# Patient Record
Sex: Female | Born: 1950 | Race: White | Hispanic: No | Marital: Single | Smoking: Never smoker
Health system: Southern US, Community
[De-identification: ages and names within clinical notes are randomized; demographics above are authoritative.]

## PROBLEM LIST (undated history)

## (undated) DIAGNOSIS — M797 Fibromyalgia: Secondary | ICD-10-CM

## (undated) DIAGNOSIS — I10 Essential (primary) hypertension: Secondary | ICD-10-CM

## (undated) DIAGNOSIS — E7800 Pure hypercholesterolemia, unspecified: Secondary | ICD-10-CM

## (undated) DIAGNOSIS — G8929 Other chronic pain: Secondary | ICD-10-CM

## (undated) DIAGNOSIS — E119 Type 2 diabetes mellitus without complications: Secondary | ICD-10-CM

## (undated) DIAGNOSIS — R7302 Impaired glucose tolerance (oral): Secondary | ICD-10-CM

## (undated) DIAGNOSIS — K219 Gastro-esophageal reflux disease without esophagitis: Secondary | ICD-10-CM

## (undated) DIAGNOSIS — R112 Nausea with vomiting, unspecified: Secondary | ICD-10-CM

## (undated) DIAGNOSIS — N209 Urinary calculus, unspecified: Secondary | ICD-10-CM

## (undated) DIAGNOSIS — F329 Major depressive disorder, single episode, unspecified: Secondary | ICD-10-CM

## (undated) DIAGNOSIS — I509 Heart failure, unspecified: Secondary | ICD-10-CM

## (undated) DIAGNOSIS — R41840 Attention and concentration deficit: Secondary | ICD-10-CM

## (undated) DIAGNOSIS — M1990 Unspecified osteoarthritis, unspecified site: Secondary | ICD-10-CM

## (undated) DIAGNOSIS — F99 Mental disorder, not otherwise specified: Secondary | ICD-10-CM

## (undated) DIAGNOSIS — K649 Unspecified hemorrhoids: Secondary | ICD-10-CM

## (undated) DIAGNOSIS — N200 Calculus of kidney: Secondary | ICD-10-CM

## (undated) DIAGNOSIS — F418 Other specified anxiety disorders: Secondary | ICD-10-CM

## (undated) DIAGNOSIS — M25473 Effusion, unspecified ankle: Secondary | ICD-10-CM

## (undated) DIAGNOSIS — Z9889 Other specified postprocedural states: Secondary | ICD-10-CM

## (undated) DIAGNOSIS — R0600 Dyspnea, unspecified: Secondary | ICD-10-CM

## (undated) DIAGNOSIS — D649 Anemia, unspecified: Secondary | ICD-10-CM

## (undated) DIAGNOSIS — Z87442 Personal history of urinary calculi: Secondary | ICD-10-CM

## (undated) DIAGNOSIS — E669 Obesity, unspecified: Secondary | ICD-10-CM

## (undated) DIAGNOSIS — R519 Headache, unspecified: Secondary | ICD-10-CM

## (undated) DIAGNOSIS — F32A Depression, unspecified: Secondary | ICD-10-CM

## (undated) DIAGNOSIS — F419 Anxiety disorder, unspecified: Secondary | ICD-10-CM

## (undated) DIAGNOSIS — G4730 Sleep apnea, unspecified: Secondary | ICD-10-CM

## (undated) HISTORY — DX: Obesity, unspecified: E66.9

## (undated) HISTORY — DX: Unspecified osteoarthritis, unspecified site: M19.90

## (undated) HISTORY — DX: Effusion, unspecified ankle: M25.473

## (undated) HISTORY — PX: OTHER SURGICAL HISTORY: SHX169

## (undated) HISTORY — DX: Attention and concentration deficit: R41.840

## (undated) HISTORY — PX: HYSTERECTOMY ABDOMINAL WITH SALPINGECTOMY: SHX6725

## (undated) HISTORY — DX: Other specified anxiety disorders: F41.8

## (undated) HISTORY — DX: Impaired glucose tolerance (oral): R73.02

## (undated) HISTORY — PX: HAND SURGERY: SHX662

## (undated) HISTORY — PX: KNEE SURGERY: SHX244

## (undated) HISTORY — DX: Type 2 diabetes mellitus without complications: E11.9

## (undated) HISTORY — PX: NASAL SEPTUM SURGERY: SHX37

## (undated) HISTORY — DX: Pure hypercholesterolemia, unspecified: E78.00

## (undated) HISTORY — PX: EYE SURGERY: SHX253

---

## 1983-05-27 HISTORY — PX: KNEE SURGERY: SHX244

## 2000-01-25 ENCOUNTER — Encounter: Admission: RE | Admit: 2000-01-25 | Discharge: 2000-01-25 | Payer: Self-pay | Admitting: Orthopedic Surgery

## 2000-01-25 ENCOUNTER — Encounter: Payer: Self-pay | Admitting: Orthopedic Surgery

## 2008-09-01 ENCOUNTER — Emergency Department (HOSPITAL_COMMUNITY): Admission: EM | Admit: 2008-09-01 | Discharge: 2008-09-01 | Payer: Self-pay | Admitting: Emergency Medicine

## 2009-09-06 ENCOUNTER — Emergency Department (HOSPITAL_COMMUNITY): Admission: EM | Admit: 2009-09-06 | Discharge: 2009-09-06 | Payer: Self-pay | Admitting: Emergency Medicine

## 2010-07-18 ENCOUNTER — Emergency Department (INDEPENDENT_AMBULATORY_CARE_PROVIDER_SITE_OTHER): Payer: Self-pay

## 2010-07-18 ENCOUNTER — Emergency Department (HOSPITAL_BASED_OUTPATIENT_CLINIC_OR_DEPARTMENT_OTHER)
Admission: EM | Admit: 2010-07-18 | Discharge: 2010-07-18 | Disposition: A | Discharge status: Home / Self Care | Payer: Self-pay | Attending: Emergency Medicine | Admitting: Emergency Medicine

## 2010-07-18 DIAGNOSIS — E860 Dehydration: Secondary | ICD-10-CM | POA: Insufficient documentation

## 2010-07-18 DIAGNOSIS — R1011 Right upper quadrant pain: Secondary | ICD-10-CM | POA: Insufficient documentation

## 2010-07-18 DIAGNOSIS — K5900 Constipation, unspecified: Secondary | ICD-10-CM | POA: Insufficient documentation

## 2010-07-18 DIAGNOSIS — R1012 Left upper quadrant pain: Secondary | ICD-10-CM | POA: Insufficient documentation

## 2010-07-18 DIAGNOSIS — R142 Eructation: Secondary | ICD-10-CM

## 2010-07-18 DIAGNOSIS — I10 Essential (primary) hypertension: Secondary | ICD-10-CM | POA: Insufficient documentation

## 2010-07-18 DIAGNOSIS — R109 Unspecified abdominal pain: Secondary | ICD-10-CM

## 2010-07-18 LAB — URINALYSIS, ROUTINE W REFLEX MICROSCOPIC
Bilirubin Urine: NEGATIVE
Hgb urine dipstick: NEGATIVE
Ketones, ur: 15 mg/dL — AB
Nitrite: NEGATIVE
Protein, ur: NEGATIVE mg/dL
Specific Gravity, Urine: 1.021 (ref 1.005–1.030)
Urine Glucose, Fasting: NEGATIVE mg/dL
Urobilinogen, UA: 0.2 mg/dL (ref 0.0–1.0)
pH: 6 (ref 5.0–8.0)

## 2010-07-18 LAB — DIFFERENTIAL
Basophils Relative: 0 % (ref 0–1)
Eosinophils Absolute: 0.2 10*3/uL (ref 0.0–0.7)
Eosinophils Relative: 3 % (ref 0–5)
Lymphs Abs: 2 10*3/uL (ref 0.7–4.0)
Neutrophils Relative %: 64 % (ref 43–77)

## 2010-07-18 LAB — BASIC METABOLIC PANEL
BUN: 10 mg/dL (ref 6–23)
Creatinine, Ser: 1 mg/dL (ref 0.4–1.2)
GFR calc Af Amer: >60 mL/min (ref >60)
GFR calc non Af Amer: 57 mL/min — ABNORMAL LOW (ref >60)
Potassium: 5.1 mEq/L (ref 3.5–5.1)

## 2010-07-18 LAB — CBC
MCV: 85.9 fL (ref 78.0–100.0)
Platelets: 207 10*3/uL (ref 150–400)
RBC: 4.61 MIL/uL (ref 3.87–5.11)
RDW: 13.5 % (ref 11.5–15.5)
WBC: 7.7 10*3/uL (ref 4.0–10.5)

## 2010-08-14 LAB — DIFFERENTIAL
Basophils Absolute: 0 10*3/uL (ref 0.0–0.1)
Basophils Relative: 1 % (ref 0–1)
Eosinophils Absolute: 0.2 10*3/uL (ref 0.0–0.7)
Eosinophils Relative: 2 % (ref 0–5)
Lymphocytes Relative: 29 % (ref 12–46)
Lymphs Abs: 2.6 K/uL (ref 0.7–4.0)
Monocytes Absolute: 0.6 10*3/uL (ref 0.1–1.0)
Monocytes Relative: 7 % (ref 3–12)
Neutro Abs: 5.5 K/uL (ref 1.7–7.7)
Neutrophils Relative %: 62 % (ref 43–77)

## 2010-08-14 LAB — COMPREHENSIVE METABOLIC PANEL
ALT: 15 U/L (ref 0–35)
AST: 18 U/L (ref 0–37)
Albumin: 3.9 g/dL (ref 3.5–5.2)
Alkaline Phosphatase: 56 U/L (ref 39–117)
CO2: 21 mEq/L (ref 19–32)
Chloride: 107 mEq/L (ref 96–112)
GFR calc Af Amer: >60 mL/min (ref >60)
Potassium: 3.4 mEq/L — ABNORMAL LOW (ref 3.5–5.1)
Total Bilirubin: 0.6 mg/dL (ref 0.3–1.2)

## 2010-08-14 LAB — CBC
HCT: 41.5 % (ref 36.0–46.0)
Hemoglobin: 14.2 g/dL (ref 12.0–15.0)
MCHC: 34.2 g/dL (ref 30.0–36.0)
MCV: 88.9 fL (ref 78.0–100.0)
Platelets: 247 10*3/uL (ref 150–400)
RBC: 4.67 MIL/uL (ref 3.87–5.11)
RDW: 13.6 % (ref 11.5–15.5)
WBC: 9 10*3/uL (ref 4.0–10.5)

## 2010-08-14 LAB — URINE CULTURE
Colony Count: NO GROWTH
Culture: NO GROWTH

## 2010-08-14 LAB — URINALYSIS, ROUTINE W REFLEX MICROSCOPIC
Glucose, UA: NEGATIVE mg/dL
Hgb urine dipstick: NEGATIVE
Ketones, ur: 40 mg/dL — AB
Nitrite: NEGATIVE
Protein, ur: NEGATIVE mg/dL
Specific Gravity, Urine: 1.027 (ref 1.005–1.030)
Urobilinogen, UA: 1 mg/dL (ref 0.0–1.0)
pH: 5.5 (ref 5.0–8.0)

## 2010-08-14 LAB — COMPREHENSIVE METABOLIC PANEL WITH GFR
BUN: 8 mg/dL (ref 6–23)
Calcium: 9.2 mg/dL (ref 8.4–10.5)
Creatinine, Ser: 0.87 mg/dL (ref 0.4–1.2)
GFR calc non Af Amer: >60 mL/min (ref >60)
Glucose, Bld: 103 mg/dL — ABNORMAL HIGH (ref 70–99)
Sodium: 138 meq/L (ref 135–145)
Total Protein: 7.4 g/dL (ref 6.0–8.3)

## 2010-08-14 LAB — LIPASE, BLOOD: Lipase: 29 U/L (ref 11–59)

## 2010-08-14 LAB — POCT CARDIAC MARKERS: CKMB, poc: <1 ng/mL — ABNORMAL LOW (ref 1.0–8.0)

## 2010-09-04 LAB — URINALYSIS, ROUTINE W REFLEX MICROSCOPIC
Ketones, ur: 15 mg/dL — AB
Nitrite: NEGATIVE
pH: 6 (ref 5.0–8.0)

## 2010-09-04 LAB — BASIC METABOLIC PANEL
CO2: 26 mEq/L (ref 19–32)
Calcium: 9.3 mg/dL (ref 8.4–10.5)
GFR calc Af Amer: 50 mL/min — ABNORMAL LOW (ref >60)
GFR calc non Af Amer: 41 mL/min — ABNORMAL LOW (ref >60)
Sodium: 141 mEq/L (ref 135–145)

## 2010-09-04 LAB — DIFFERENTIAL
Lymphocytes Relative: 14 % (ref 12–46)
Lymphs Abs: 1.5 10*3/uL (ref 0.7–4.0)
Monocytes Absolute: 0.6 10*3/uL (ref 0.1–1.0)
Monocytes Relative: 5 % (ref 3–12)
Neutro Abs: 8.2 10*3/uL — ABNORMAL HIGH (ref 1.7–7.7)

## 2010-09-04 LAB — URINE MICROSCOPIC-ADD ON

## 2010-09-04 LAB — CBC
Hemoglobin: 12.7 g/dL (ref 12.0–15.0)
RBC: 4.3 MIL/uL (ref 3.87–5.11)

## 2011-11-01 ENCOUNTER — Emergency Department (HOSPITAL_BASED_OUTPATIENT_CLINIC_OR_DEPARTMENT_OTHER)
Admission: EM | Admit: 2011-11-01 | Discharge: 2011-11-01 | Disposition: A | Discharge status: Home / Self Care | Payer: Medicare Other | Attending: Emergency Medicine | Admitting: Emergency Medicine

## 2011-11-01 ENCOUNTER — Encounter (HOSPITAL_BASED_OUTPATIENT_CLINIC_OR_DEPARTMENT_OTHER): Payer: Self-pay | Admitting: *Deleted

## 2011-11-01 ENCOUNTER — Emergency Department (HOSPITAL_BASED_OUTPATIENT_CLINIC_OR_DEPARTMENT_OTHER): Payer: Medicare Other

## 2011-11-01 DIAGNOSIS — K5900 Constipation, unspecified: Secondary | ICD-10-CM | POA: Insufficient documentation

## 2011-11-01 DIAGNOSIS — Z79899 Other long term (current) drug therapy: Secondary | ICD-10-CM | POA: Insufficient documentation

## 2011-11-01 DIAGNOSIS — N390 Urinary tract infection, site not specified: Secondary | ICD-10-CM | POA: Insufficient documentation

## 2011-11-01 DIAGNOSIS — R109 Unspecified abdominal pain: Secondary | ICD-10-CM

## 2011-11-01 HISTORY — DX: Calculus of kidney: N20.0

## 2011-11-01 LAB — URINALYSIS, ROUTINE W REFLEX MICROSCOPIC
Bilirubin Urine: NEGATIVE
Hgb urine dipstick: NEGATIVE
Nitrite: NEGATIVE
Specific Gravity, Urine: 1.009 (ref 1.005–1.030)
pH: 5.5 (ref 5.0–8.0)

## 2011-11-01 LAB — COMPREHENSIVE METABOLIC PANEL
Alkaline Phosphatase: 74 U/L (ref 39–117)
BUN: 11 mg/dL (ref 6–23)
CO2: 26 mEq/L (ref 19–32)
GFR calc Af Amer: 70 mL/min — ABNORMAL LOW (ref >90)
GFR calc non Af Amer: 60 mL/min — ABNORMAL LOW (ref >90)
Glucose, Bld: 114 mg/dL — ABNORMAL HIGH (ref 70–99)
Potassium: 4 mEq/L (ref 3.5–5.1)
Total Bilirubin: 0.3 mg/dL (ref 0.3–1.2)
Total Protein: 8.2 g/dL (ref 6.0–8.3)

## 2011-11-01 LAB — DIFFERENTIAL
Eosinophils Absolute: 0.2 10*3/uL (ref 0.0–0.7)
Lymphocytes Relative: 31 % (ref 12–46)
Lymphs Abs: 3 10*3/uL (ref 0.7–4.0)
Monocytes Relative: 8 % (ref 3–12)
Neutrophils Relative %: 59 % (ref 43–77)

## 2011-11-01 LAB — URINE MICROSCOPIC-ADD ON

## 2011-11-01 LAB — CBC
Hemoglobin: 14.6 g/dL (ref 12.0–15.0)
MCH: 28.8 pg (ref 26.0–34.0)
MCV: 86.4 fL (ref 78.0–100.0)
RBC: 5.07 MIL/uL (ref 3.87–5.11)

## 2011-11-01 LAB — LIPASE, BLOOD: Lipase: 49 U/L (ref 11–59)

## 2011-11-01 MED ORDER — NITROFURANTOIN MONOHYD MACRO 100 MG PO CAPS: 100.0000 mg | ORAL_CAPSULE | Freq: Two times a day (BID) | ORAL | Status: AC

## 2011-11-01 MED ORDER — NITROFURANTOIN MONOHYD MACRO 100 MG PO CAPS: 100.0000 mg | ORAL_CAPSULE | Freq: Two times a day (BID) | ORAL | Status: DC

## 2011-11-01 NOTE — Discharge Instructions (Signed)
Urinary Tract Infection  Infections of the urinary tract can start in several places. A bladder infection (cystitis), a kidney infection (pyelonephritis), and a prostate infection (prostatitis) are different types of urinary tract infections (UTIs). They usually get better if treated with medicines (antibiotics) that kill germs. Take all the medicine until it is gone. You or your child may feel better in a few days, but TAKE ALL MEDICINE or the infection may not respond and may become more difficult to treat.  HOME CARE INSTRUCTIONS    Drink enough water and fluids to keep the urine clear or pale yellow. Cranberry juice is especially recommended, in addition to large amounts of water.   Avoid caffeine, tea, and carbonated beverages. They tend to irritate the bladder.   Alcohol may irritate the prostate.   Only take over-the-counter or prescription medicines for pain, discomfort, or fever as directed by your caregiver.  To prevent further infections:   Empty the bladder often. Avoid holding urine for long periods of time.   After a bowel movement, women should cleanse from front to back. Use each tissue only once.   Empty the bladder before and after sexual intercourse.  FINDING OUT THE RESULTS OF YOUR TEST  Not all test results are available during your visit. If your or your child's test results are not back during the visit, make an appointment with your caregiver to find out the results. Do not assume everything is normal if you have not heard from your caregiver or the medical facility. It is important for you to follow up on all test results.  SEEK MEDICAL CARE IF:    There is back pain.   Your baby is older than 3 months with a rectal temperature of 100.5 F (38.1 C) or higher for more than 1 day.   Your or your child's problems (symptoms) are no better in 3 days. Return sooner if you or your child is getting worse.  SEEK IMMEDIATE MEDICAL CARE IF:    There is severe back pain or lower abdominal  pain.   You or your child develops chills.   You have a fever.   Your baby is older than 3 months with a rectal temperature of 102 F (38.9 C) or higher.   Your baby is 3 months old or younger with a rectal temperature of 100.4 F (38 C) or higher.   There is nausea or vomiting.   There is continued burning or discomfort with urination.  MAKE SURE YOU:    Understand these instructions.   Will watch your condition.   Will get help right away if you are not doing well or get worse.  Document Released: 02/19/2005 Document Revised: 05/01/2011 Document Reviewed: 09/24/2006  ExitCare Patient Information 2012 [employer].    Abdominal Pain  Abdominal pain can be caused by many things. Your caregiver decides the seriousness of your pain by an examination and possibly blood tests and X-rays. Many cases can be observed and treated at home. Most abdominal pain is not caused by a disease and will probably improve without treatment. However, in many cases, more time must pass before a clear cause of the pain can be found. Before that point, it may not be known if you need more testing, or if hospitalization or surgery is needed.  HOME CARE INSTRUCTIONS    Do not take laxatives unless directed by your caregiver.   Take pain medicine only as directed by your caregiver.   Only take over-the-counter or   prescription medicines for pain, discomfort, or fever as directed by your caregiver.   Try a clear liquid diet (broth, tea, or water) for as long as directed by your caregiver. Slowly move to a bland diet as tolerated.  SEEK IMMEDIATE MEDICAL CARE IF:    The pain does not go away.   You have a fever.   You keep throwing up (vomiting).   The pain is felt only in portions of the abdomen. Pain in the right side could possibly be appendicitis. In an adult, pain in the left lower portion of the abdomen could be colitis or diverticulitis.   You pass bloody or black tarry stools.  MAKE SURE YOU:    Understand these  instructions.   Will watch your condition.   Will get help right away if you are not doing well or get worse.  Document Released: 02/19/2005 Document Revised: 05/01/2011 Document Reviewed: 12/29/2007  [employer] Patient Information 2012 [employer].

## 2011-11-01 NOTE — ED Provider Notes (Signed)
History     CSN: [id number]  Arrival date & time 11/01/11  0865   First MD Initiated Contact with Patient 11/01/11 1915      Chief Complaint  Patient presents with  . Nausea    (Consider location/radiation/quality/duration/timing/severity/associated sxs/prior treatment) Patient is a 61 y.o. female presenting with constipation. The history is provided by the patient. No language interpreter was used.  Constipation  The current episode started 5 to 7 days ago. The problem occurs continuously. The problem has been rapidly worsening. The pain is moderate. The stool is described as soft. Prior successful therapies include fiber, laxatives and stool softeners. Associated symptoms include a fever, abdominal pain and vomiting. She has been eating and drinking normally. There were no sick contacts. She has received no recent medical care.  Pt complains of increased belching,  Abdominal bloating.   Pt feels constipated.  Pt has used multiple stool softners and laxatives,  Pt reports she has been taking high dose vitamin D.  Past Medical History  Diagnosis Date  . Kidney stone     Past Surgical History  Procedure Date  . Knee surgery     History reviewed. No pertinent family history.  History  Substance Use Topics  . Smoking status: Never Smoker   . Smokeless tobacco: Not on file  . Alcohol Use: No    OB History    Grav Para Term Preterm Abortions TAB SAB Ect Mult Living                  Review of Systems  Constitutional: Positive for fever.  Gastrointestinal: Positive for vomiting, abdominal pain and constipation.  All other systems reviewed and are negative.    Allergies  Penicillins  Home Medications   Current Outpatient Rx  Name Route Sig Dispense Refill  . BISACODYL 5 MG PO TBEC Oral Take 5 mg by mouth daily as needed.    . CHOLECALCIFEROL 400 UNITS PO TABS Oral Take 1,600 Units by mouth daily.    Marland Kitchen FLUOXETINE HCL 40 MG PO CAPS Oral Take 40 mg by mouth daily.      . IBUPROFEN 800 MG PO TABS Oral Take 800 mg by mouth every 8 (eight) hours as needed. Patient is using this medication for pain.    Marland Kitchen POLYETHYLENE GLYCOL 3350 PO PACK Oral Take 17 g by mouth daily.    . QUETIAPINE FUMARATE 50 MG PO TABS Oral Take 50 mg by mouth at bedtime. Patient is using this medication for sleep.    . SENNA-DOCUSATE SODIUM 8.6-50 MG PO TABS Oral Take 1 tablet by mouth daily.      BP 172/91  Pulse 74  Temp(Src) 99.2 F (37.3 C) (Oral)  Resp 20  Ht 5\' 5"  (1.651 m)  Wt 240 lb (108.863 kg)  BMI 39.94 kg/m2  SpO2 95%  Physical Exam  Nursing note and vitals reviewed. Constitutional: She is oriented to person, place, and time. She appears well-developed and well-nourished.  HENT:  Head: Normocephalic and atraumatic.  Right Ear: External ear normal.  Left Ear: External ear normal.  Nose: Nose normal.  Mouth/Throat: Oropharynx is clear and moist.  Eyes: Conjunctivae and EOM are normal. Pupils are equal, round, and reactive to light.  Neck: Normal range of motion. Neck supple.  Cardiovascular: Normal rate, regular rhythm and normal heart sounds.   Pulmonary/Chest: Effort normal and breath sounds normal.  Abdominal: Soft. Bowel sounds are normal.  Musculoskeletal: Normal range of motion.  Neurological: She is alert and  oriented to person, place, and time.  Skin: Skin is warm.  Psychiatric: She has a normal mood and affect.    ED Course  Procedures (including critical care time)  Labs Reviewed  URINALYSIS, ROUTINE W REFLEX MICROSCOPIC - Abnormal; Notable for the following:    Leukocytes, UA SMALL (*)    All other components within normal limits  COMPREHENSIVE METABOLIC PANEL - Abnormal; Notable for the following:    Glucose, Bld 114 (*)    GFR calc non Af Amer 60 (*)    GFR calc Af Amer 70 (*)    All other components within normal limits  URINE MICROSCOPIC-ADD ON - Abnormal; Notable for the following:    Squamous Epithelial / LPF FEW (*)    Bacteria, UA FEW  (*)    All other components within normal limits  CBC  DIFFERENTIAL  LIPASE, BLOOD   Dg Abd 1 View  11/01/2011  *RADIOLOGY REPORT*  Clinical Data: Pain with nausea and diarrhea.  ABDOMEN - 1 VIEW  Comparison:  None.  Findings: The bowel gas pattern is normal.  No radio-opaque calculi or other significant radiographic abnormality is seen.  IMPRESSION: Negative.  Original Report Authenticated By: Elsie Stain, M.D.     1. UTI (lower urinary tract infection)   2. Abdominal pain       MDM  Pt scheduled for gallbladder ultrsound.   I counseled on uti.           Lonia Skinner [place], [place] 11/02/11 [phone]

## 2011-11-01 NOTE — ED Notes (Signed)
Pt states she has been on Tramadol for awhile and was reading that it may cause constipation. Has been taking steps to improve that situation, but now has diarrhea and thinks she may also have a kidney infection.

## 2011-11-03 ENCOUNTER — Ambulatory Visit (HOSPITAL_BASED_OUTPATIENT_CLINIC_OR_DEPARTMENT_OTHER)
Admit: 2011-11-03 | Discharge: 2011-11-03 | Disposition: A | Discharge status: Home / Self Care | Payer: Medicare Other | Attending: Emergency Medicine | Admitting: Emergency Medicine

## 2011-11-03 ENCOUNTER — Inpatient Hospital Stay (HOSPITAL_BASED_OUTPATIENT_CLINIC_OR_DEPARTMENT_OTHER): Admit: 2011-11-03 | Payer: Medicare Other

## 2011-11-03 DIAGNOSIS — K838 Other specified diseases of biliary tract: Secondary | ICD-10-CM | POA: Insufficient documentation

## 2011-11-03 DIAGNOSIS — R141 Gas pain: Secondary | ICD-10-CM | POA: Insufficient documentation

## 2011-11-03 DIAGNOSIS — R110 Nausea: Secondary | ICD-10-CM | POA: Insufficient documentation

## 2011-11-03 DIAGNOSIS — R509 Fever, unspecified: Secondary | ICD-10-CM | POA: Insufficient documentation

## 2011-11-03 DIAGNOSIS — K5900 Constipation, unspecified: Secondary | ICD-10-CM | POA: Insufficient documentation

## 2011-11-03 DIAGNOSIS — N390 Urinary tract infection, site not specified: Secondary | ICD-10-CM | POA: Insufficient documentation

## 2011-11-03 DIAGNOSIS — R143 Flatulence: Secondary | ICD-10-CM | POA: Insufficient documentation

## 2011-11-03 DIAGNOSIS — R142 Eructation: Secondary | ICD-10-CM | POA: Insufficient documentation

## 2011-11-03 NOTE — ED Provider Notes (Signed)
This note is being added as an addendum to the prior emergency department visit.  Patient has come back today for her abdominal ultrasound and I did speak with the patient regarding her results.  I informed her of the fact that she does have gallbladder sludge but no apparent cholecystitis on exam.  Patient does note that when she she gets pain and nausea.  She is comfortable at this time.  I'm going to refer the patient to general surgery for evaluation for possible cholecystectomy to [facility] surgery.  Nat Christen, MD 11/03/11 [phone]

## 2011-11-04 NOTE — ED Provider Notes (Signed)
Medical screening examination/treatment/procedure(s) were performed by non-physician practitioner and as supervising physician I was immediately available for consultation/collaboration.  Ajai Terhaar, MD 11/04/11 1222 

## 2011-11-26 ENCOUNTER — Encounter (INDEPENDENT_AMBULATORY_CARE_PROVIDER_SITE_OTHER): Payer: Self-pay | Admitting: General Surgery

## 2011-11-26 ENCOUNTER — Ambulatory Visit (INDEPENDENT_AMBULATORY_CARE_PROVIDER_SITE_OTHER): Payer: Medicare Other | Admitting: General Surgery

## 2011-11-26 VITALS — BP 117/77 | HR 85 | Temp 97.5°F | Resp 16 | Ht 65.0 in | Wt 248.0 lb

## 2011-11-26 DIAGNOSIS — K8020 Calculus of gallbladder without cholecystitis without obstruction: Secondary | ICD-10-CM

## 2011-11-26 NOTE — Progress Notes (Signed)
Patient ID: Olivia Walker, female   DOB: [birth year], 61 y.o.   MRN: [id number]  Chief Complaint  Patient presents with  . Abdominal Pain    Gallbladder    HPI Olivia Walker is a 61 y.o. female.  Referred by Dr. Cyndra Numbers HPI This is a 61 year old female who has had some for abdominal bloating for some time. She has also complained of some right-sided back pain just below her scapula as well as the right upper quadrant abdominal pain. She was recently seen in the emergency room was diagnosed with a urinary tract infection but also complained of more right upper quadrant pain. At that point underwent an ultrasound that showed biliary sludge and no other really abnormalities. She's been watching what she eats since then. She does associate with the bloating and her upper abdomen the right-sided back pain and some right upper quadrant pain with eating and this has gotten better see she change her diet. She's had no nausea or vomiting associated with this. Her bowel movements are otherwise normal at just for her regular constipation. She comes in today to discuss the possibility of her gallbladder as a source of some of her symptoms.  Past Medical History  Diagnosis Date  . Kidney stone   . Arthritis     Past Surgical History  Procedure Date  . Knee surgery   . Knee surgery 1985    lateral release  . Hand surgery     History reviewed. No pertinent family history.  Social History History  Substance Use Topics  . Smoking status: Never Smoker   . Smokeless tobacco: Not on file  . Alcohol Use: No    Allergies  Allergen Reactions  . Penicillins     Current Outpatient Prescriptions  Medication Sig Dispense Refill  . FLUoxetine (PROZAC) 40 MG capsule Take 40 mg by mouth daily.      Marland Kitchen ibuprofen (ADVIL,MOTRIN) 800 MG tablet Take 800 mg by mouth every 8 (eight) hours as needed. Patient is using this medication for pain.      . polyethylene glycol (MIRALAX / GLYCOLAX) packet Take 17 g by  mouth daily.      . QUEtiapine (SEROQUEL) 50 MG tablet Take 50 mg by mouth at bedtime. Patient is using this medication for sleep.      . sennosides-docusate sodium (SENOKOT-S) 8.6-50 MG tablet Take 1 tablet by mouth daily.        Review of Systems Review of Systems  Constitutional: Negative for fever, chills and unexpected weight change.  HENT: Positive for congestion. Negative for hearing loss, sore throat, trouble swallowing and voice change.   Eyes: Negative for visual disturbance.  Respiratory: Negative for cough and wheezing.   Cardiovascular: Negative for chest pain, palpitations and leg swelling.  Gastrointestinal: Negative for nausea, vomiting, abdominal pain, diarrhea, constipation, blood in stool, abdominal distention and anal bleeding.  Genitourinary: Negative for hematuria, vaginal bleeding and difficulty urinating.  Musculoskeletal: Positive for arthralgias.  Skin: Negative for rash and wound.  Neurological: Negative for seizures, syncope and headaches.  Hematological: Negative for adenopathy. Does not bruise/bleed easily.  Psychiatric/Behavioral: Negative for confusion.    Blood pressure 117/77, pulse 85, temperature 97.5 F (36.4 C), temperature source Temporal, resp. rate 16, height 5\' 5"  (1.651 m), weight 248 lb (112.492 kg).  Physical Exam Physical Exam  Vitals reviewed. Constitutional: She appears well-developed and well-nourished.  Eyes: No scleral icterus.  Cardiovascular: Normal rate, regular rhythm and normal heart sounds.   Pulmonary/Chest:  Effort normal and breath sounds normal. She has no wheezes. She has no rales.  Abdominal: Soft. Normal appearance and bowel sounds are normal. There is tenderness in the right upper quadrant. There is negative Murphy's sign. No hernia.  Lymphadenopathy:    She has no cervical adenopathy.    Data Reviewed COMPLETE ABDOMINAL ULTRASOUND  Comparison: 11/01/2011.  Findings:  Gallbladder: Biliary sludge is present in the  dependent portions  of the gallbladder. No posterior acoustic shadowing is present to  suggest definite calculi. Gallbladder wall thickness is normal at  2 mm and there is no sonographic Murphy's sign.  Common bile duct: 3 mm, normal.  Liver: Liver is isoechoic when compared to the adjacent right  kidney. No mass lesion.  IVC: Suboptimal visualization due to overlying bowel gas.  Pancreas: Suboptimal visualization due to overlying bowel gas.  Spleen: 7.9 cm. Normal echotexture.  Right Kidney: 9.6 cm. Normal echotexture. Normal central sinus  echo complex. No calculi or hydronephrosis.  Left Kidney: 10.3 cm. Normal echotexture. Normal central sinus  echo complex. No calculi or hydronephrosis.  Abdominal aorta: The visualized portions. Suboptimal  visualization due to body habitus and bowel gas.  IMPRESSION:  1. Biliary sludge without findings to suggest cholecystitis. No  stones.  2. Technically suboptimal exam due to overlying bowel gas and body  habitus.   Assessment    Symptomatic cholelithiasis    Plan    Lap cholecystectomy  After talking with her and reviewing u/s with sludge I do think she has biliary related symptoms and we discussed lap chole both for symptoms now as well as prevention of further problems.I did tell her I did not think this would relieve all of her upper abdominal symptoms. I discussed the procedure in detail.  The patient was given Agricultural engineer.  We discussed the risks and benefits of a laparoscopic cholecystectomy and possible cholangiogram including, but not limited to bleeding, infection, injury to surrounding structures such as the intestine or liver, bile leak, retained gallstones, need to convert to an open procedure, prolonged diarrhea, blood clots such as  DVT, common bile duct injury, anesthesia risks, and possible need for additional procedures.  The likelihood of improvement in symptoms and return to the patient's normal status is good. We  discussed the typical post-operative recovery course.        Olivia Walker 11/26/2011, 12:36 PM

## 2011-11-26 NOTE — Patient Instructions (Signed)
[facility] LAPAROSCOPIC SURGERY: POST OP INSTRUCTIONS  Always review your discharge instruction sheet given to you by the facility where your surgery was performed. IF YOU HAVE DISABILITY OR FAMILY LEAVE FORMS, YOU MUST BRING THEM TO THE OFFICE FOR PROCESSING.   DO NOT GIVE THEM TO YOUR DOCTOR.  1. A prescription for pain medication may be given to you upon discharge.  Take your pain medication as prescribed, if needed.  If narcotic pain medicine is not needed, then you may take acetaminophen (Tylenol), naprosyn (Alleve), or ibuprofen (Advil) as needed. 2. Take your usually prescribed medications unless otherwise directed. 3. If you need a refill on your pain medication, please contact your pharmacy.  They will contact our office to request authorization. Prescriptions will not be filled after 5pm or on week-ends. 4. You should follow a light diet the first few days after arrival home, such as soup and crackers, etc.  Be sure to include lots of fluids daily. 5. Most patients will experience some swelling and bruising in the area of the incisions.  Ice packs will help.  Swelling and bruising can take several days to resolve.  6. It is common to experience some constipation if taking pain medication after surgery.  Increasing fluid intake and taking a stool softener (such as Colace) will usually help or prevent this problem from occurring.  A mild laxative (Milk of Magnesia or Miralax) should be taken according to package instructions if there are no bowel movements after 48 hours. 7. Unless discharge instructions indicate otherwise, you may remove your bandages 48 hours after surgery, and you may shower at that time.  You may have steri-strips (small skin tapes) in place directly over the incision.  These strips should be left on the skin for 7-10 days.  If your surgeon used skin glue on the incision, you may shower in 24 hours.  The glue will flake  off over the next 2-3 weeks.  Any sutures or staples will be removed at the office during your follow-up visit. 8. ACTIVITIES:  You may resume regular (light) daily activities beginning the next day--such as daily self-care, walking, climbing stairs--gradually increasing activities as tolerated.  You may have sexual intercourse when it is comfortable.  Refrain from any heavy lifting or straining until approved by your doctor. a. You may drive when you are no longer taking prescription pain medication, you can comfortably wear a seatbelt, and you can safely maneuver your car and apply brakes. b. RETURN TO WORK:  __________________________________________________________ 9. You should see your doctor in the office for a follow-up appointment approximately 2-3 weeks after your surgery.  Make sure that you call for this appointment within a day or two after you arrive home to insure a convenient appointment time. 10. OTHER INSTRUCTIONS: __________________________________________________________________________________________________________________________ __________________________________________________________________________________________________________________________ WHEN TO CALL YOUR DOCTOR: 1. Fever over 101.0 2. Inability to urinate 3. Continued bleeding from incision. 4. Increased pain, redness, or drainage from the incision. 5. Increasing abdominal pain  The clinic staff is available to answer your questions during regular business hours.  Please don't hesitate to call and ask to speak to one of the nurses for clinical concerns.  If you have a medical emergency, go to the nearest emergency room or call 911.  A surgeon from [facility] is always on call at the hospital. [address], [address], [place], [place]  [address] ? [address] [address], [place], [place]   [address] [phone] ? [phone] ? [phone]   [phone] Web site: [email or web address]  

## 2011-12-23 ENCOUNTER — Encounter (HOSPITAL_COMMUNITY): Payer: Self-pay | Admitting: Pharmacy Technician

## 2011-12-29 ENCOUNTER — Encounter (HOSPITAL_COMMUNITY): Payer: Self-pay

## 2011-12-29 ENCOUNTER — Encounter (HOSPITAL_COMMUNITY)
Admission: RE | Admit: 2011-12-29 | Discharge: 2011-12-29 | Disposition: A | Discharge status: Home / Self Care | Payer: Medicare Other | Source: Ambulatory Visit | Attending: General Surgery | Admitting: General Surgery

## 2011-12-29 HISTORY — DX: Urinary calculus, unspecified: N20.9

## 2011-12-29 HISTORY — DX: Gastro-esophageal reflux disease without esophagitis: K21.9

## 2011-12-29 HISTORY — DX: Mental disorder, not otherwise specified: F99

## 2011-12-29 HISTORY — DX: Sleep apnea, unspecified: G47.30

## 2011-12-29 LAB — COMPREHENSIVE METABOLIC PANEL
ALT: 15 U/L (ref 0–35)
AST: 22 U/L (ref 0–37)
Calcium: 9.5 mg/dL (ref 8.4–10.5)
GFR calc Af Amer: 74 mL/min — ABNORMAL LOW (ref >90)
Glucose, Bld: 117 mg/dL — ABNORMAL HIGH (ref 70–99)
Sodium: 142 mEq/L (ref 135–145)
Total Protein: 7.4 g/dL (ref 6.0–8.3)

## 2011-12-29 LAB — SURGICAL PCR SCREEN: MRSA, PCR: NEGATIVE

## 2011-12-29 LAB — CBC
MCH: 28.8 pg (ref 26.0–34.0)
MCHC: 32.2 g/dL (ref 30.0–36.0)
Platelets: 238 10*3/uL (ref 150–400)

## 2011-12-29 MED ORDER — CIPROFLOXACIN IN D5W 400 MG/200ML IV SOLN
400.0000 mg | INTRAVENOUS | Status: AC
  Administered 2011-12-30: 400 mg via INTRAVENOUS
  Filled 2011-12-29: qty 200

## 2011-12-29 NOTE — Pre-Procedure Instructions (Addendum)
20 Olivia Walker  12/29/2011   Your procedure is scheduled on:  12/30/11  Report to [facility] at530* AM.  Call this number if you have problems the morning of surgery: [phone]   Remember:   Do not eat food or drink:After Midnight.  .  Take these medicines the morning of surgery with A SIP OF WATER: prozac, acid tab  STOP ibuprofen, other nsaids, aspirin, herbal meds, blood thinners   Do not wear jewelry, make-up or nail polish.  Do not wear lotions, powders, or perfumes. You may wear deodorant.  Do not shave 48 hours prior to surgery. Men may shave face and neck.  Do not bring valuables to the hospital.  Contacts, dentures or bridgework may not be worn into surgery.  Leave suitcase in the car. After surgery it may be brought to your room.  For patients admitted to the hospital, checkout time is 11:00 AM the day of discharge.   Patients discharged the day of surgery will not be allowed to drive home.  Name and phone number of your driver:linda hedgecock [phone] friend  Special Instructions: CHG Shower Use Special Wash: 1/2 bottle night before surgery and 1/2 bottle morning of surgery.   Please read over the following fact sheets that you were given: Pain Booklet, Coughing and Deep Breathing and MRSA Information

## 2011-12-29 NOTE — Progress Notes (Signed)
req ekg, cxr from dr Wylene Simmer done 7.13

## 2011-12-30 ENCOUNTER — Ambulatory Visit (HOSPITAL_COMMUNITY)
Admission: RE | Admit: 2011-12-30 | Discharge: 2011-12-30 | Disposition: A | Discharge status: Home / Self Care | Payer: Medicare Other | Source: Ambulatory Visit | Attending: General Surgery | Admitting: General Surgery

## 2011-12-30 ENCOUNTER — Encounter (HOSPITAL_COMMUNITY): Payer: Self-pay | Admitting: Anesthesiology

## 2011-12-30 ENCOUNTER — Encounter (HOSPITAL_COMMUNITY)
Admission: RE | Disposition: A | Discharge status: Home / Self Care | Payer: Self-pay | Source: Ambulatory Visit | Attending: General Surgery

## 2011-12-30 ENCOUNTER — Ambulatory Visit (HOSPITAL_COMMUNITY): Payer: Medicare Other | Admitting: Anesthesiology

## 2011-12-30 DIAGNOSIS — Z01812 Encounter for preprocedural laboratory examination: Secondary | ICD-10-CM | POA: Insufficient documentation

## 2011-12-30 DIAGNOSIS — K824 Cholesterolosis of gallbladder: Secondary | ICD-10-CM

## 2011-12-30 DIAGNOSIS — K811 Chronic cholecystitis: Secondary | ICD-10-CM

## 2011-12-30 DIAGNOSIS — K219 Gastro-esophageal reflux disease without esophagitis: Secondary | ICD-10-CM | POA: Insufficient documentation

## 2011-12-30 DIAGNOSIS — M129 Arthropathy, unspecified: Secondary | ICD-10-CM | POA: Insufficient documentation

## 2011-12-30 DIAGNOSIS — F411 Generalized anxiety disorder: Secondary | ICD-10-CM | POA: Insufficient documentation

## 2011-12-30 DIAGNOSIS — I10 Essential (primary) hypertension: Secondary | ICD-10-CM | POA: Insufficient documentation

## 2011-12-30 DIAGNOSIS — K8020 Calculus of gallbladder without cholecystitis without obstruction: Secondary | ICD-10-CM | POA: Insufficient documentation

## 2011-12-30 HISTORY — PX: CHOLECYSTECTOMY: SHX55

## 2011-12-30 SURGERY — LAPAROSCOPIC CHOLECYSTECTOMY
Anesthesia: General | Site: Abdomen | Wound class: Contaminated

## 2011-12-30 MED ORDER — FENTANYL CITRATE 0.05 MG/ML IJ SOLN
INTRAMUSCULAR | Status: DC | PRN
  Administered 2011-12-30 (×5): 50 ug via INTRAVENOUS

## 2011-12-30 MED ORDER — OXYCODONE-ACETAMINOPHEN 5-325 MG PO TABS: 1.0000 | ORAL_TABLET | ORAL | Status: AC | PRN

## 2011-12-30 MED ORDER — HYDROMORPHONE HCL PF 1 MG/ML IJ SOLN
INTRAMUSCULAR | Status: AC
  Filled 2011-12-30: qty 1

## 2011-12-30 MED ORDER — LABETALOL HCL 5 MG/ML IV SOLN
INTRAVENOUS | Status: DC | PRN
  Administered 2011-12-30 (×2): 2.5 mg via INTRAVENOUS

## 2011-12-30 MED ORDER — SODIUM CHLORIDE 0.9 % IR SOLN
Status: DC | PRN
  Administered 2011-12-30: 1000 mL

## 2011-12-30 MED ORDER — GLYCOPYRROLATE 0.2 MG/ML IJ SOLN
INTRAMUSCULAR | Status: DC | PRN
  Administered 2011-12-30: 0.6 mg via INTRAVENOUS

## 2011-12-30 MED ORDER — BUPIVACAINE-EPINEPHRINE PF 0.25-1:200000 % IJ SOLN
INTRAMUSCULAR | Status: AC
  Filled 2011-12-30: qty 30

## 2011-12-30 MED ORDER — SUCCINYLCHOLINE CHLORIDE 20 MG/ML IJ SOLN
INTRAMUSCULAR | Status: DC | PRN
  Administered 2011-12-30: 110 mg via INTRAVENOUS

## 2011-12-30 MED ORDER — NEOSTIGMINE METHYLSULFATE 1 MG/ML IJ SOLN
INTRAMUSCULAR | Status: DC | PRN
  Administered 2011-12-30: 4 mg via INTRAVENOUS

## 2011-12-30 MED ORDER — HYDROMORPHONE HCL PF 1 MG/ML IJ SOLN
0.2500 mg | INTRAMUSCULAR | Status: DC | PRN
  Administered 2011-12-30: 0.5 mg via INTRAVENOUS
  Administered 2011-12-30: 0.25 mg via INTRAVENOUS
  Administered 2011-12-30: 0.5 mg via INTRAVENOUS

## 2011-12-30 MED ORDER — OXYCODONE HCL 5 MG PO TABS
5.0000 mg | ORAL_TABLET | ORAL | Status: DC | PRN
  Administered 2011-12-30: 10 mg via ORAL

## 2011-12-30 MED ORDER — LACTATED RINGERS IV SOLN
INTRAVENOUS | Status: DC | PRN
  Administered 2011-12-30 (×2): via INTRAVENOUS

## 2011-12-30 MED ORDER — ONDANSETRON HCL 4 MG/2ML IJ SOLN
INTRAMUSCULAR | Status: DC | PRN
  Administered 2011-12-30: 4 mg via INTRAVENOUS

## 2011-12-30 MED ORDER — BUPIVACAINE-EPINEPHRINE 0.25% -1:200000 IJ SOLN
INTRAMUSCULAR | Status: DC | PRN
  Administered 2011-12-30: 17 mL

## 2011-12-30 MED ORDER — OXYCODONE HCL 5 MG PO TABS
ORAL_TABLET | ORAL | Status: AC
  Filled 2011-12-30: qty 2

## 2011-12-30 MED ORDER — PROPOFOL 10 MG/ML IV EMUL
INTRAVENOUS | Status: DC | PRN
  Administered 2011-12-30: 20 mg via INTRAVENOUS
  Administered 2011-12-30: 40 mg via INTRAVENOUS
  Administered 2011-12-30: 160 mg via INTRAVENOUS

## 2011-12-30 MED ORDER — VECURONIUM BROMIDE 10 MG IV SOLR
INTRAVENOUS | Status: DC | PRN
  Administered 2011-12-30: 6 mg via INTRAVENOUS

## 2011-12-30 MED ORDER — MIDAZOLAM HCL 5 MG/5ML IJ SOLN
INTRAMUSCULAR | Status: DC | PRN
  Administered 2011-12-30: 1 mg via INTRAVENOUS

## 2011-12-30 MED ORDER — LIDOCAINE HCL (CARDIAC) 20 MG/ML IV SOLN
INTRAVENOUS | Status: DC | PRN
  Administered 2011-12-30: 80 mg via INTRAVENOUS

## 2011-12-30 SURGICAL SUPPLY — 46 items
ADH SKN CLS APL DERMABOND .7 (GAUZE/BANDAGES/DRESSINGS) ×1
APPLIER CLIP 5 13 M/L LIGAMAX5 (MISCELLANEOUS) ×2
APR CLP MED LRG 5 ANG JAW (MISCELLANEOUS) ×1
BAG SPEC RTRVL LRG 6X4 10 (ENDOMECHANICALS) ×1
BLADE SURG ROTATE 9660 (MISCELLANEOUS) IMPLANT
CANISTER SUCTION 2500CC (MISCELLANEOUS) ×2 IMPLANT
CHLORAPREP W/TINT 26ML (MISCELLANEOUS) ×2 IMPLANT
CLIP APPLIE 5 13 M/L LIGAMAX5 (MISCELLANEOUS) ×1 IMPLANT
CLOTH BEACON ORANGE TIMEOUT ST (SAFETY) ×2 IMPLANT
COVER MAYO STAND STRL (DRAPES) IMPLANT
COVER SURGICAL LIGHT HANDLE (MISCELLANEOUS) ×2 IMPLANT
DECANTER SPIKE VIAL GLASS SM (MISCELLANEOUS) ×1 IMPLANT
DERMABOND ADVANCED (GAUZE/BANDAGES/DRESSINGS) ×1
DERMABOND ADVANCED .7 DNX12 (GAUZE/BANDAGES/DRESSINGS) ×1 IMPLANT
DEVICE TROCAR PUNCTURE CLOSURE (ENDOMECHANICALS) ×1 IMPLANT
DRAPE C-ARM 42X72 X-RAY (DRAPES) IMPLANT
ELECT REM PT RETURN 9FT ADLT (ELECTROSURGICAL) ×2
ELECTRODE REM PT RTRN 9FT ADLT (ELECTROSURGICAL) ×1 IMPLANT
ENDOLOOP SUT PDS II  0 18 (SUTURE)
ENDOLOOP SUT PDS II 0 18 (SUTURE) IMPLANT
GLOVE BIO SURGEON STRL SZ7 (GLOVE) ×3 IMPLANT
GLOVE BIOGEL PI IND STRL 7.0 (GLOVE) IMPLANT
GLOVE BIOGEL PI IND STRL 7.5 (GLOVE) ×1 IMPLANT
GLOVE BIOGEL PI INDICATOR 7.0 (GLOVE) ×2
GLOVE BIOGEL PI INDICATOR 7.5 (GLOVE) ×1
GLOVE ECLIPSE 6.5 STRL STRAW (GLOVE) ×1 IMPLANT
GOWN STRL NON-REIN LRG LVL3 (GOWN DISPOSABLE) ×7 IMPLANT
KIT BASIN OR (CUSTOM PROCEDURE TRAY) ×2 IMPLANT
KIT ROOM TURNOVER OR (KITS) ×2 IMPLANT
NS IRRIG 1000ML POUR BTL (IV SOLUTION) ×2 IMPLANT
PAD ARMBOARD 7.5X6 YLW CONV (MISCELLANEOUS) ×1 IMPLANT
POUCH SPECIMEN RETRIEVAL 10MM (ENDOMECHANICALS) ×2 IMPLANT
SCISSORS LAP 5X35 DISP (ENDOMECHANICALS) IMPLANT
SET CHOLANGIOGRAPH 5 50 .035 (SET/KITS/TRAYS/PACK) ×1 IMPLANT
SET IRRIG TUBING LAPAROSCOPIC (IRRIGATION / IRRIGATOR) ×2 IMPLANT
SLEEVE ENDOPATH XCEL 5M (ENDOMECHANICALS) ×4 IMPLANT
SPECIMEN JAR SMALL (MISCELLANEOUS) ×2 IMPLANT
SUT MNCRL AB 4-0 PS2 18 (SUTURE) ×2 IMPLANT
SUT VIC AB 2-0 SH 27 (SUTURE) ×2
SUT VIC AB 2-0 SH 27XBRD (SUTURE) IMPLANT
SUT VICRYL 0 UR6 27IN ABS (SUTURE) ×1 IMPLANT
TOWEL OR 17X24 6PK STRL BLUE (TOWEL DISPOSABLE) ×2 IMPLANT
TOWEL OR 17X26 10 PK STRL BLUE (TOWEL DISPOSABLE) ×2 IMPLANT
TRAY LAPAROSCOPIC (CUSTOM PROCEDURE TRAY) ×2 IMPLANT
TROCAR XCEL BLUNT TIP 100MML (ENDOMECHANICALS) ×2 IMPLANT
TROCAR XCEL NON-BLD 5MMX100MML (ENDOMECHANICALS) ×2 IMPLANT

## 2011-12-30 NOTE — Op Note (Signed)
Preoperative diagnosis: Symptomatic cholelithiasis Postoperative diagnosis: Same as above Procedure: Laparoscopic cholecystectomy Surgeon: Dr. Harden Mo Anesthesia: Gen. Endotracheal Complications: None Estimated blood loss: Minimal Drains: None Specimens: Gallbladder and contents to pathology Sponge and needle count correct x2 in the operation Disposition to recovery in stable condition  Indications: This is a 61 year old female who has symptoms consistent with biliary colic. She also has an ultrasound that showed sludge. She and I met and discussed laparoscopic cholecystectomy for what appears to be biliary colic to relieve her symptoms and prevent further problems. The risks and benefits of the procedure were discussed prior to beginning.  Procedure: After informed consent was obtained the patient was taken to the operating room. She was a administered 400 mg of IV ciprofloxacin due to her penicillin allergy. Sequential compression devices were placed on the legs. She was then placed under general endotracheal anesthesia without complication. Her abdomen was prepped and draped in the standard sterile surgical fashion. A surgical timeout was then performed.  I infiltrated Marcaine below her umbilicus. A vertical incision was then made. Her fascia was grasped and entered sharply. I entered the peritoneum bluntly. I then placed a 0 Vicryl pursestring suture through the fascia. A Hassan trocar was then introduced and the abdomen was insufflated to 15 mmHg pressure. I then inserted 3 further 5 mm trocars in the epigastrium and right upper quadrant under direct vision after infiltration with local anesthetic without complication. Her gallbladder was then retracted cephalad. This was very difficult due to the fact she had a lot of visceral fat. Eventually I was able to retract the gallbladder cephalad. I actually evacuated some bile from the gallbladder to make it easier to grasp. I then was able to  retract the gallbladder cephalad and lateral. I then dissected the triangle and obtained the critical view of safety. I did not do a cholangiogram due to  normal liver function tests and I clearly had a critical view of safety. I then clipped the artery 3 times and divided it I then treated the duct in a similar fashion. The gallbladder was then removed from the liver bed without difficulty. He was then placed in an Endo Catch bag and removed from the umbilicus. Irrigation was performed until this was clear hemostasis was obtained. I then removed the [facility] trocar and tied my pursestring down. I did place an additional 2-0 Vicryl suture to approximate this using the Endo Close device. I then desufflated the abdomen removed all trocars. I closed these with 4 Monocryl and Dermabond. She tolerated this well was extubated and transferred to recovery stable.

## 2011-12-30 NOTE — Anesthesia Procedure Notes (Signed)
Procedure Name: Intubation Date/Time: 12/30/2011 7:45 AM Performed by: Darcey Nora B Pre-anesthesia Checklist: Patient identified, Emergency Drugs available, Suction available and Patient being monitored Patient Re-evaluated:Patient Re-evaluated prior to inductionOxygen Delivery Method: Circle system utilized Preoxygenation: Pre-oxygenation with 100% oxygen Intubation Type: IV induction Ventilation: Mask ventilation without difficulty Laryngoscope Size: Mac and 3 Grade View: Grade I Tube type: Oral Tube size: 7.5 mm Number of attempts: 1 Airway Equipment and Method: Stylet Placement Confirmation: ETT inserted through vocal cords under direct vision,  breath sounds checked- equal and bilateral and positive ETCO2 Secured at: 21 (cm at gum) cm Tube secured with: Tape Dental Injury: Teeth and Oropharynx as per pre-operative assessment

## 2011-12-30 NOTE — Anesthesia Postprocedure Evaluation (Signed)
  Anesthesia Post-op Note  Patient: Olivia Walker  Procedure(s) Performed: Procedure(s) (LRB): LAPAROSCOPIC CHOLECYSTECTOMY (N/A)  Patient Location: PACU  Anesthesia Type: General  Level of Consciousness: awake  Airway and Oxygen Therapy: Patient Spontanous Breathing  Post-op Pain: mild  Post-op Assessment: Post-op Vital signs reviewed  Post-op Vital Signs: Reviewed  Complications: No apparent anesthesia complications

## 2011-12-30 NOTE — Interval H&P Note (Signed)
History and Physical Interval Note:  12/30/2011 7:15 AM  Olivia Walker  has presented today for surgery, with the diagnosis of cholelithiasis  The various methods of treatment have been discussed with the patient and family. After consideration of risks, benefits and other options for treatment, the patient has consented to  Procedure(s) (LRB): LAPAROSCOPIC CHOLECYSTECTOMY (N/A) as a surgical intervention .  The patient's history has been reviewed, patient examined, no change in status, stable for surgery.  I have reviewed the patient's chart and labs.  Questions were answered to the patient's satisfaction.     Olivia Walker

## 2011-12-30 NOTE — Transfer of Care (Signed)
Immediate Anesthesia Transfer of Care Note  Patient: Olivia Walker  Procedure(s) Performed: Procedure(s) (LRB): LAPAROSCOPIC CHOLECYSTECTOMY (N/A)  Patient Location: PACU  Anesthesia Type: General  Level of Consciousness: awake, oriented and patient cooperative  Airway & Oxygen Therapy: Patient Spontanous Breathing and Patient connected to nasal cannula oxygen  Post-op Assessment: Report given to PACU RN, Post -op Vital signs reviewed and stable and Patient moving all extremities  Post vital signs: Reviewed and stable  Complications: No apparent anesthesia complications

## 2011-12-30 NOTE — Preoperative (Signed)
Beta Blockers   Reason not to administer Beta Blockers:Not Applicable 

## 2011-12-30 NOTE — H&P (Signed)
HPI  This is a 61 year old female who has had some for abdominal bloating for some time. She has also complained of some right-sided back pain just below her scapula as well as the right upper quadrant abdominal pain. She was recently seen in the emergency room was diagnosed with a urinary tract infection but also complained of more right upper quadrant pain. At that point underwent an ultrasound that showed biliary sludge and no other really abnormalities. She's been watching what she eats since then. She does associate this with the bloating and her upper abdomen the right-sided back pain and some right upper quadrant pain with eating and this has gotten better see she change her diet. She's had no nausea or vomiting associated with this. Her bowel movements are otherwise normal at just for her regular constipation.   Past Medical History   Diagnosis  Date   .  Kidney stone    .  Arthritis     Past Surgical History   Procedure  Date   .  Knee surgery    .  Knee surgery  1985     lateral release   .  Hand surgery     History reviewed. No pertinent family history.  Social History  History   Substance Use Topics   .  Smoking status:  Never Smoker   .  Smokeless tobacco:  Not on file   .  Alcohol Use:  No    Allergies   Allergen  Reactions   .  Penicillins     Current Outpatient Prescriptions   Medication  Sig  Dispense  Refill   .  FLUoxetine (PROZAC) 40 MG capsule  Take 40 mg by mouth daily.     Marland Kitchen  ibuprofen (ADVIL,MOTRIN) 800 MG tablet  Take 800 mg by mouth every 8 (eight) hours as needed. Patient is using this medication for pain.     .  polyethylene glycol (MIRALAX / GLYCOLAX) packet  Take 17 g by mouth daily.     .  QUEtiapine (SEROQUEL) 50 MG tablet  Take 50 mg by mouth at bedtime. Patient is using this medication for sleep.     .  sennosides-docusate sodium (SENOKOT-S) 8.6-50 MG tablet  Take 1 tablet by mouth daily.        Blood pressure 117/77, pulse 85, temperature 97.5  F (36.4 C), temperature source Temporal, resp. rate 16, height 5\' 5"  (1.651 m), weight 248 lb (112.492 kg).  Physical Exam  Physical Exam  Vitals reviewed.  Constitutional: She appears well-developed and well-nourished.  Eyes: No scleral icterus.   Abdominal: Soft. Normal appearance and bowel sounds are normal. There is tenderness in the right upper quadrant. There is negative Murphy's sign. No hernia.    Data Reviewed  COMPLETE ABDOMINAL ULTRASOUND  Comparison: 11/01/2011.  Findings:  Gallbladder: Biliary sludge is present in the dependent portions  of the gallbladder. No posterior acoustic shadowing is present to  suggest definite calculi. Gallbladder wall thickness is normal at  2 mm and there is no sonographic Murphy's sign.  Common bile duct: 3 mm, normal.  Liver: Liver is isoechoic when compared to the adjacent right  kidney. No mass lesion.  IVC: Suboptimal visualization due to overlying bowel gas.  Pancreas: Suboptimal visualization due to overlying bowel gas.  Spleen: 7.9 cm. Normal echotexture.  Right Kidney: 9.6 cm. Normal echotexture. Normal central sinus  echo complex. No calculi or hydronephrosis.  Left Kidney: 10.3 cm. Normal echotexture. Normal central sinus  echo  complex. No calculi or hydronephrosis.  Abdominal aorta: The visualized portions. Suboptimal  visualization due to body habitus and bowel gas.  IMPRESSION:  1. Biliary sludge without findings to suggest cholecystitis. No  stones.  2. Technically suboptimal exam due to overlying bowel gas and body  habitus.  Assessment   Symptomatic cholelithiasis   Plan   Lap cholecystectomy  After talking with her and reviewing u/s with sludge I do think she has biliary related symptoms and we discussed lap chole both for symptoms now as well as prevention of further problems.I did tell her I did not think this would relieve all of her upper abdominal symptoms.  I discussed the procedure in detail. The patient  was given Agricultural engineer. We discussed the risks and benefits of a laparoscopic cholecystectomy and possible cholangiogram including, but not limited to bleeding, infection, injury to surrounding structures such as the intestine or liver, bile leak, retained gallstones, need to convert to an open procedure, prolonged diarrhea, blood clots such as DVT, common bile duct injury, anesthesia risks, and possible need for additional procedures. The likelihood of improvement in symptoms and return to the patient's normal status is good. We discussed the typical post-operative recovery course.

## 2011-12-30 NOTE — Anesthesia Preprocedure Evaluation (Addendum)
Anesthesia Evaluation  Patient identified by MRN, date of birth, ID band Patient awake    Reviewed: Allergy & Precautions, H&P , NPO status , Patient's Chart, lab work & pertinent test results, reviewed documented beta blocker date and time   Airway Mallampati: II TM Distance: >3 FB Neck ROM: Limited    Dental  (+) Partial Upper and Dental Advisory Given   Pulmonary  breath sounds clear to auscultation        Cardiovascular hypertension, Pt. on medications Rhythm:Regular Rate:Normal     Neuro/Psych Anxiety    GI/Hepatic Neg liver ROS, GERD-  Controlled,  Endo/Other  negative endocrine ROS  Renal/GU negative Renal ROS     Musculoskeletal   Abdominal   Peds  Hematology negative hematology ROS (+)   Anesthesia Other Findings Receding chin  Reproductive/Obstetrics                        Anesthesia Physical Anesthesia Plan  ASA: III  Anesthesia Plan: General   Post-op Pain Management:    Induction: Intravenous  Airway Management Planned: Oral ETT  Additional Equipment:   Intra-op Plan:   Post-operative Plan: Extubation in OR  Informed Consent:   Dental advisory given  Plan Discussed with: CRNA, Anesthesiologist and Surgeon  Anesthesia Plan Comments:         Anesthesia Quick Evaluation

## 2011-12-31 ENCOUNTER — Encounter (HOSPITAL_COMMUNITY): Payer: Self-pay | Admitting: General Surgery

## 2012-01-19 ENCOUNTER — Ambulatory Visit (INDEPENDENT_AMBULATORY_CARE_PROVIDER_SITE_OTHER): Payer: Medicare Other | Admitting: General Surgery

## 2012-01-19 ENCOUNTER — Encounter (INDEPENDENT_AMBULATORY_CARE_PROVIDER_SITE_OTHER): Payer: Self-pay | Admitting: General Surgery

## 2012-01-19 VITALS — BP 132/80 | HR 72 | Temp 97.2°F | Resp 16 | Ht 65.0 in | Wt 238.4 lb

## 2012-01-19 DIAGNOSIS — Z09 Encounter for follow-up examination after completed treatment for conditions other than malignant neoplasm: Secondary | ICD-10-CM

## 2012-01-19 NOTE — Progress Notes (Signed)
Subjective:     Patient ID: Olivia Walker, female   DOB: [birth year], 61 y.o.   MRN: [id number]  HPI This is a 61 year old female who had biliary colic. She is now 3 weeks status post a laparoscopic cholecystectomy was uneventful. Her pathology showed chronic cholecystitis and cholesterolosis. She returns today doing well without any significant complaints. She still has some mild tenderness in her umbilical incision but is otherwise well. She is eating well and having fairly normal bowel movements for her.  Review of Systems     Objective:   Physical Exam    Incisions without infection but all clearly have a minor reaction to the  Monocryl. Assessment:     Status post laparoscopic cholecystectomy    Plan:     She can return to full normal activity a normal diet. I told her the reaction to the monocryl will resolve soon. I will see her back as needed.

## 2012-05-26 HISTORY — PX: TOTAL HIP ARTHROPLASTY: SHX124

## 2012-05-28 ENCOUNTER — Other Ambulatory Visit: Payer: Self-pay | Admitting: Orthopedic Surgery

## 2012-05-28 MED ORDER — DEXAMETHASONE SODIUM PHOSPHATE 10 MG/ML IJ SOLN: 10.0000 mg | Freq: Once | INTRAMUSCULAR | Status: DC

## 2012-05-28 MED ORDER — BUPIVACAINE LIPOSOME 1.3 % IJ SUSP: 20.0000 mL | Freq: Once | INTRAMUSCULAR | Status: DC

## 2012-05-28 NOTE — Progress Notes (Signed)
Preoperative surgical orders have been place into the [facility] hospital system for Olivia Walker on 05/28/2012, 1:51 PM  by Patrica Duel for surgery on 07/07/2012.  Preop Total Hip orders including Experel Injecion, IV Tylenol, and IV Decadron as long as there are no contraindications to the above medications. Avel Peace, PA-C

## 2012-06-15 ENCOUNTER — Other Ambulatory Visit: Payer: Self-pay | Admitting: Orthopedic Surgery

## 2012-06-29 ENCOUNTER — Encounter (HOSPITAL_COMMUNITY): Payer: Self-pay | Admitting: Pharmacy Technician

## 2012-07-01 ENCOUNTER — Encounter (HOSPITAL_COMMUNITY): Payer: Self-pay

## 2012-07-01 ENCOUNTER — Ambulatory Visit (HOSPITAL_COMMUNITY)
Admission: RE | Admit: 2012-07-01 | Discharge: 2012-07-01 | Disposition: A | Discharge status: Home / Self Care | Payer: Medicare Other | Source: Ambulatory Visit | Attending: Orthopedic Surgery | Admitting: Orthopedic Surgery

## 2012-07-01 ENCOUNTER — Encounter (HOSPITAL_COMMUNITY)
Admission: RE | Admit: 2012-07-01 | Discharge: 2012-07-01 | Disposition: A | Discharge status: Home / Self Care | Payer: Medicare Other | Source: Ambulatory Visit | Attending: Orthopedic Surgery | Admitting: Orthopedic Surgery

## 2012-07-01 DIAGNOSIS — Z01818 Encounter for other preprocedural examination: Secondary | ICD-10-CM | POA: Insufficient documentation

## 2012-07-01 DIAGNOSIS — Z01812 Encounter for preprocedural laboratory examination: Secondary | ICD-10-CM | POA: Insufficient documentation

## 2012-07-01 HISTORY — DX: Depression, unspecified: F32.A

## 2012-07-01 HISTORY — DX: Unspecified hemorrhoids: K64.9

## 2012-07-01 HISTORY — DX: Other chronic pain: G89.29

## 2012-07-01 HISTORY — DX: Major depressive disorder, single episode, unspecified: F32.9

## 2012-07-01 HISTORY — DX: Anxiety disorder, unspecified: F41.9

## 2012-07-01 LAB — COMPREHENSIVE METABOLIC PANEL
AST: 17 U/L (ref 0–37)
Albumin: 3.6 g/dL (ref 3.5–5.2)
Calcium: 9.6 mg/dL (ref 8.4–10.5)
Chloride: 102 mEq/L (ref 96–112)
Creatinine, Ser: 0.91 mg/dL (ref 0.50–1.10)
Total Bilirubin: 0.3 mg/dL (ref 0.3–1.2)
Total Protein: 7.6 g/dL (ref 6.0–8.3)

## 2012-07-01 LAB — URINALYSIS, ROUTINE W REFLEX MICROSCOPIC
Nitrite: NEGATIVE
Protein, ur: NEGATIVE mg/dL
Urobilinogen, UA: 1 mg/dL (ref 0.0–1.0)

## 2012-07-01 LAB — APTT: aPTT: 27 seconds (ref 24–37)

## 2012-07-01 LAB — SURGICAL PCR SCREEN: MRSA, PCR: NEGATIVE

## 2012-07-01 LAB — CBC
MCV: 87.4 fL (ref 78.0–100.0)
Platelets: 249 10*3/uL (ref 150–400)
RDW: 13.4 % (ref 11.5–15.5)
WBC: 8.8 10*3/uL (ref 4.0–10.5)

## 2012-07-01 LAB — PROTIME-INR: INR: 0.95 (ref 0.00–1.49)

## 2012-07-01 NOTE — Patient Instructions (Addendum)
20 AMNAH BREUER  07/01/2012   Your procedure is scheduled on: 07/07/12  Report to [facility] at [phone].  Call this number if you have problems the morning of surgery 336-: [phone]   Remember:   Do not eat food or drink liquids After Midnight.     Take these medicines the morning of surgery with A SIP OF WATER: prozac, norco if needed, pepcid if needed, wellbutrin XL   Do not wear jewelry, make-up or nail polish.  Do not wear lotions, powders, or perfumes. You may wear deodorant.  Do not shave 48 hours prior to surgery. Men may shave face and neck.  Do not bring valuables to the hospital.  Contacts, dentures or bridgework may not be worn into surgery.  Leave suitcase in the car. After surgery it may be brought to your room.  For patients admitted to the hospital, checkout time is 11:00 AM the day of discharge.    Please read over the following fact sheets that you were given: MRSA Information, blood fact sheet, incentive spirometer fact sheet  Birdie Sons, RN  pre op nurse call if needed [phone]    FAILURE TO FOLLOW THESE INSTRUCTIONS MAY RESULT IN CANCELLATION OF YOUR SURGERY   Patient Signature: ___________________________________________

## 2012-07-01 NOTE — Progress Notes (Signed)
Last office visit note 05/31/12 Dr. Wylene Simmer on chart, EKG 11/14/11 on chart, Chest x-ray 11/14/11 on chart, surgery clearance note 02/10/12 Dr. Wylene Simmer on chart

## 2012-07-01 NOTE — Progress Notes (Signed)
Abnormal UA and micro routed to Dr. Lequita Halt via [facility]

## 2012-07-02 LAB — URINE CULTURE

## 2012-07-02 NOTE — Progress Notes (Signed)
Faxed note received back from Dr. Deri Fuelling office that pt was called to start Cipro

## 2012-07-04 ENCOUNTER — Other Ambulatory Visit: Payer: Self-pay | Admitting: Orthopedic Surgery

## 2012-07-04 NOTE — H&P (Signed)
Olivia Walker  DOB: 03/24/1951 Single / Language: [place] / Race: White Female  Date of Admission:  07/07/2012  Chief Complaint:  Left Hip Pain and Bilateral Knee Pain  History of Present Illness The patient is a 62 year old female who comes in for a preoperative History and Physical. The patient is scheduled for a right total hip arthroplasty to be performed by Dr. Gus Rankin. Aluisio, MD at [facility] on 07/07/2012. The patient was seen for a second opinion.The patient reports right hip problems including pain symptoms that have been present for 9 month(s) (or more). She also presents with knee complaints. The patient was seen for a second opinion. The patient reports left knee and right knee symptoms including: pain . Past treatment for this problem has included nonsteroidal anti-inflammatory drugs.The symptoms began without any known injury. Note for "Hip problem": She has had horrible groin pain. Her hip problem is much worse than the knee problems at this time. Her main problem now is the right hip pain. She has previously been treated by Dr. Priscille Kluver. He has told her in the past that she needed both knees replaced. She had knee arthroscopies in the past, in 1986 with a lateral release on the right, and in 2001 on the left. She was told she had significant arthritis in both knees at that time. The knees have been chronically painful, but for the past year she has developed worsening groin pain. It is hurting at all times now. It is now radiating to the buttock and radiating down the thigh to her knee. She has worse pain with activity but has pain at rest also. It's keeping her up at night. She has lost a lot of function and has a hard time bending. The left hip does not hurt. She is ready to proceed with her hip surgery. She would also like to get bilateral knee injections at the time of surgery. They have been treated conservatively in the past for the above stated  problem and despite conservative measures, they continue to have progressive pain and severe functional limitations and dysfunction. They have failed non-operative management including home exercise, medications. It is felt that they would benefit from undergoing total joint replacement. Risks and benefits of the procedure have been discussed with the patient and they elect to proceed with surgery. There are no active contraindications to surgery such as ongoing infection or rapidly progressive neurological disease.  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 mother Diabetes Mellitus. mother   Social History Marital status. single Number of flights of stairs before winded. less than 1 Pain Contract. no Exercise. Exercises rarely; does other Illicit drug use. no Living situation. live alone Tobacco use. never smoker Current work status. disabled Drug/Alcohol Rehab (Currently). no Drug/Alcohol Rehab (Previously). no Alcohol use. never consumed alcohol Children. 0 Post-Surgical Plans. Plan is to go home.   Medication History PROzac ( Oral) Specific dose unknown - Active. Norco (7.5-325MG  Tablet, 1-2 Tablet Oral every 4-6 hours as needed for pain, Taken starting 06/14/2012) Active. (called to [facility] pharmacy [phone]) Wellbutrin XL (150MG  Tablet ER 24HR, Oral) Active. Xanax (0.5MG  Tablet, Oral at bedtime, as needed) Active.  Past Surgical History Arthroscopy of Knee. right Straighten Nasal Septum   Medical History Anxiety Disorder Chronic Pain Depression Kidney Stone Hemorrhoids   Review of Systems General:Not Present- Chills, Fever, Night Sweats, Appetite Loss, Fatigue, Feeling sick, Weight  Gain and Weight Loss. HEENT:Not Present- Sensitivity to light, Hearing problems, Nose Bleed and Ringing in the  Ears. Respiratory:Not Present- Snoring, Chronic Cough, Bloody sputum and Dyspnea. Cardiovascular:Present- Swelling of Extremities. Not Present- Shortness of Breath, Chest Pain, Leg Cramps and Palpitations. Gastrointestinal:Not Present- Bloody Stool, Heartburn, Abdominal Pain, Vomiting, Nausea and Incontinence of Stool. Female Genitourinary:Not Present- Blood in Urine, Menstrual Irregularities, Frequency, Incontinence and Nocturia. Musculoskeletal:Present- Joint Stiffness, Joint Pain and Back Pain. Not Present- Muscle Weakness, Muscle Pain and Joint Swelling. Neurological:Not Present- Tingling, Numbness, Burning, Tremor, Headaches and Dizziness. Psychiatric:Present- Anxiety and Depression. Not Present- Memory Loss. Hematology:Not Present- Abnormal Bleeding, Anemia, Blood Clots and Easy Bruising.   Vitals Weight: 229 lb Height: 65 in Body Surface Area: 2.18 m Body Mass Index: 38.11 kg/m Pulse: 76 (Regular) Resp.: 14 (Unlabored) BP: 134/88 (Sitting, Left Arm, Standard)    Physical Exam The physical exam findings are as follows:  Note: Patient is a 62 year old female with continued hip and knee pain.   General Mental Status - Alert, cooperative and good historian. General Appearance- pleasant. Not in acute distress. Orientation- Oriented X3. Build & Nutrition- Well nourished and Well developed.   Head and Neck Head- normocephalic, atraumatic . Neck Global Assessment- supple. no bruit auscultated on the right and no bruit auscultated on the left.   Eye Vision- Wears corrective lenses. Pupil- Bilateral- Regular and Round. Motion- Bilateral- EOMI.   Chest and Lung Exam Auscultation: Breath sounds:- clear at anterior chest wall and - clear at posterior chest wall. Adventitious sounds:- No Adventitious sounds.   Cardiovascular Auscultation:Rhythm- Regular rate and rhythm. Heart Sounds- S1 WNL and S2 WNL. Murmurs & Other Heart  Sounds:Auscultation of the heart reveals - No Murmurs.   Abdomen Inspection:Contour- Generalized moderate distention. Palpation/Percussion:Tenderness- Abdomen is non-tender to palpation. Rigidity (guarding)- Abdomen is soft. Auscultation:Auscultation of the abdomen reveals - Bowel sounds normal.   Female Genitourinary Not done, not pertinent to present illness  Musculoskeletal  On exam, well-developed female, very pleasant, alert and oriented, in no apparent distress. Evaluation of her left hip shows normal ROM and no discomfort. Right hip: Flexion 90. No internal rotation. About 10 external rotation, 20 abduction. Both knees show no effusion. There is marked crepitus on ROM of both knees. She has tenderness medial greater than lateral with no instability. Pulses, sensation and motor are intact distally.  RADIOGRAPHS: AP pelvis, lateral of the right hip show advanced end stage bone on bone arthritis of the right hip. The left hip looks normal. Both knees, AP and lateral, show that she does have significant joint space narrowing medially just about bone on bone in the medial and patellofemoral compartments.  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 Lequita Halt.  Plan is to go home.  PCP - Dr. Wylene Simmer - Patient has been seen preoperatively and felt to be stable for surgery.  Avel Peace, PA-C

## 2012-07-06 MED ORDER — VANCOMYCIN HCL 10 G IV SOLR
1500.0000 mg | INTRAVENOUS | Status: AC
  Administered 2012-07-07: 1500 mg via INTRAVENOUS
  Filled 2012-07-06: qty 1500

## 2012-07-07 ENCOUNTER — Inpatient Hospital Stay (HOSPITAL_COMMUNITY): Payer: Medicare Other

## 2012-07-07 ENCOUNTER — Encounter (HOSPITAL_COMMUNITY): Payer: Self-pay | Admitting: *Deleted

## 2012-07-07 ENCOUNTER — Inpatient Hospital Stay (HOSPITAL_COMMUNITY)
Admission: RE | Admit: 2012-07-07 | Discharge: 2012-07-09 | DRG: 470 | Disposition: A | Discharge status: Home Health | Payer: Medicare Other | Source: Ambulatory Visit | Attending: Orthopedic Surgery | Admitting: Orthopedic Surgery

## 2012-07-07 ENCOUNTER — Inpatient Hospital Stay (HOSPITAL_COMMUNITY): Payer: Medicare Other | Admitting: Anesthesiology

## 2012-07-07 ENCOUNTER — Encounter (HOSPITAL_COMMUNITY)
Admission: RE | Disposition: A | Discharge status: Home Health | Payer: Self-pay | Source: Ambulatory Visit | Attending: Orthopedic Surgery

## 2012-07-07 ENCOUNTER — Encounter (HOSPITAL_COMMUNITY): Payer: Self-pay | Admitting: Anesthesiology

## 2012-07-07 DIAGNOSIS — M169 Osteoarthritis of hip, unspecified: Secondary | ICD-10-CM | POA: Diagnosis present

## 2012-07-07 DIAGNOSIS — F411 Generalized anxiety disorder: Secondary | ICD-10-CM | POA: Diagnosis present

## 2012-07-07 DIAGNOSIS — M1610 Unilateral primary osteoarthritis, unspecified hip: Principal | ICD-10-CM | POA: Diagnosis present

## 2012-07-07 DIAGNOSIS — E669 Obesity, unspecified: Secondary | ICD-10-CM | POA: Diagnosis present

## 2012-07-07 DIAGNOSIS — F329 Major depressive disorder, single episode, unspecified: Secondary | ICD-10-CM | POA: Diagnosis present

## 2012-07-07 DIAGNOSIS — F3289 Other specified depressive episodes: Secondary | ICD-10-CM | POA: Diagnosis present

## 2012-07-07 DIAGNOSIS — K219 Gastro-esophageal reflux disease without esophagitis: Secondary | ICD-10-CM | POA: Diagnosis present

## 2012-07-07 HISTORY — PX: TOTAL HIP ARTHROPLASTY: SHX124

## 2012-07-07 LAB — TYPE AND SCREEN
ABO/RH(D): A POS
Antibody Screen: NEGATIVE

## 2012-07-07 SURGERY — ARTHROPLASTY, HIP, TOTAL,POSTERIOR APPROACH
Anesthesia: General | Site: Hip | Laterality: Right | Wound class: Clean

## 2012-07-07 MED ORDER — VANCOMYCIN HCL IN DEXTROSE 1-5 GM/200ML-% IV SOLN
1000.0000 mg | Freq: Two times a day (BID) | INTRAVENOUS | Status: AC
  Administered 2012-07-07: 1000 mg via INTRAVENOUS
  Filled 2012-07-07: qty 200

## 2012-07-07 MED ORDER — ROCURONIUM BROMIDE 100 MG/10ML IV SOLN
INTRAVENOUS | Status: DC | PRN
  Administered 2012-07-07: 35 mg via INTRAVENOUS

## 2012-07-07 MED ORDER — 0.9 % SODIUM CHLORIDE (POUR BTL) OPTIME
TOPICAL | Status: DC | PRN
  Administered 2012-07-07: 1000 mL

## 2012-07-07 MED ORDER — PHENOL 1.4 % MT LIQD: 1.0000 | OROMUCOSAL | Status: DC | PRN

## 2012-07-07 MED ORDER — ONDANSETRON HCL 4 MG/2ML IJ SOLN
INTRAMUSCULAR | Status: DC | PRN
  Administered 2012-07-07: 4 mg via INTRAVENOUS

## 2012-07-07 MED ORDER — FENTANYL CITRATE 0.05 MG/ML IJ SOLN
INTRAMUSCULAR | Status: DC | PRN
  Administered 2012-07-07: 100 ug via INTRAVENOUS

## 2012-07-07 MED ORDER — PROPOFOL 10 MG/ML IV BOLUS
INTRAVENOUS | Status: DC | PRN
  Administered 2012-07-07: 200 mg via INTRAVENOUS

## 2012-07-07 MED ORDER — LIDOCAINE HCL 1 % IJ SOLN
INTRAMUSCULAR | Status: DC | PRN
  Administered 2012-07-07: 10 mL

## 2012-07-07 MED ORDER — DEXAMETHASONE 6 MG PO TABS
10.0000 mg | ORAL_TABLET | Freq: Once | ORAL | Status: AC
  Administered 2012-07-08: 10 mg via ORAL
  Filled 2012-07-07: qty 1

## 2012-07-07 MED ORDER — PROMETHAZINE HCL 25 MG/ML IJ SOLN: 6.2500 mg | INTRAMUSCULAR | Status: DC | PRN

## 2012-07-07 MED ORDER — GLYCOPYRROLATE 0.2 MG/ML IJ SOLN
INTRAMUSCULAR | Status: DC | PRN
  Administered 2012-07-07: 0.6 mg via INTRAVENOUS

## 2012-07-07 MED ORDER — METHYLPREDNISOLONE ACETATE 80 MG/ML IJ SUSP
INTRAMUSCULAR | Status: DC | PRN
  Administered 2012-07-07: 80 mg

## 2012-07-07 MED ORDER — FAMOTIDINE 20 MG PO TABS
20.0000 mg | ORAL_TABLET | ORAL | Status: DC | PRN
  Filled 2012-07-07: qty 1

## 2012-07-07 MED ORDER — ONDANSETRON HCL 4 MG/2ML IJ SOLN: 4.0000 mg | Freq: Four times a day (QID) | INTRAMUSCULAR | Status: DC | PRN

## 2012-07-07 MED ORDER — SUCCINYLCHOLINE CHLORIDE 20 MG/ML IJ SOLN
INTRAMUSCULAR | Status: DC | PRN
  Administered 2012-07-07: 100 mg via INTRAVENOUS

## 2012-07-07 MED ORDER — ACETAMINOPHEN 650 MG RE SUPP: 650.0000 mg | Freq: Four times a day (QID) | RECTAL | Status: DC | PRN

## 2012-07-07 MED ORDER — POLYETHYLENE GLYCOL 3350 17 G PO PACK
17.0000 g | PACK | Freq: Every day | ORAL | Status: DC
  Administered 2012-07-08: 17 g via ORAL

## 2012-07-07 MED ORDER — DOCUSATE SODIUM 100 MG PO CAPS
100.0000 mg | ORAL_CAPSULE | Freq: Two times a day (BID) | ORAL | Status: DC
  Administered 2012-07-07 – 2012-07-09 (×4): 100 mg via ORAL

## 2012-07-07 MED ORDER — DEXTROSE-NACL 5-0.9 % IV SOLN
INTRAVENOUS | Status: DC
  Administered 2012-07-07 – 2012-07-08 (×2): via INTRAVENOUS

## 2012-07-07 MED ORDER — LACTATED RINGERS IV SOLN
INTRAVENOUS | Status: DC
  Administered 2012-07-07: 1000 mL via INTRAVENOUS
  Administered 2012-07-07: 12:00:00 via INTRAVENOUS

## 2012-07-07 MED ORDER — DEXAMETHASONE SODIUM PHOSPHATE 10 MG/ML IJ SOLN: 10.0000 mg | Freq: Once | INTRAMUSCULAR | Status: AC

## 2012-07-07 MED ORDER — MORPHINE SULFATE 2 MG/ML IJ SOLN
1.0000 mg | INTRAMUSCULAR | Status: DC | PRN
  Administered 2012-07-08: 1 mg via INTRAVENOUS
  Filled 2012-07-07: qty 1

## 2012-07-07 MED ORDER — MIDAZOLAM HCL 5 MG/5ML IJ SOLN
INTRAMUSCULAR | Status: DC | PRN
  Administered 2012-07-07: 2 mg via INTRAVENOUS

## 2012-07-07 MED ORDER — TRAMADOL HCL 50 MG PO TABS: 50.0000 mg | ORAL_TABLET | Freq: Four times a day (QID) | ORAL | Status: DC | PRN

## 2012-07-07 MED ORDER — METHOCARBAMOL 500 MG PO TABS
500.0000 mg | ORAL_TABLET | Freq: Four times a day (QID) | ORAL | Status: DC | PRN
  Administered 2012-07-08 – 2012-07-09 (×5): 500 mg via ORAL
  Filled 2012-07-07 (×5): qty 1

## 2012-07-07 MED ORDER — OXYCODONE HCL 5 MG PO TABS
5.0000 mg | ORAL_TABLET | ORAL | Status: DC | PRN
  Administered 2012-07-07 (×3): 5 mg via ORAL
  Administered 2012-07-08 (×3): 10 mg via ORAL
  Administered 2012-07-08 (×3): 5 mg via ORAL
  Administered 2012-07-09 (×4): 10 mg via ORAL
  Filled 2012-07-07 (×2): qty 1
  Filled 2012-07-07: qty 2
  Filled 2012-07-07 (×2): qty 1
  Filled 2012-07-07 (×5): qty 2
  Filled 2012-07-07: qty 1
  Filled 2012-07-07 (×2): qty 2

## 2012-07-07 MED ORDER — METOCLOPRAMIDE HCL 10 MG PO TABS: 5.0000 mg | ORAL_TABLET | Freq: Three times a day (TID) | ORAL | Status: DC | PRN

## 2012-07-07 MED ORDER — ONDANSETRON HCL 4 MG PO TABS: 4.0000 mg | ORAL_TABLET | Freq: Four times a day (QID) | ORAL | Status: DC | PRN

## 2012-07-07 MED ORDER — BUPROPION HCL ER (XL) 150 MG PO TB24
150.0000 mg | ORAL_TABLET | Freq: Every day | ORAL | Status: DC
  Filled 2012-07-07 (×3): qty 1

## 2012-07-07 MED ORDER — RIVAROXABAN 10 MG PO TABS
10.0000 mg | ORAL_TABLET | Freq: Every day | ORAL | Status: DC
  Administered 2012-07-08 – 2012-07-09 (×2): 10 mg via ORAL
  Filled 2012-07-07 (×3): qty 1

## 2012-07-07 MED ORDER — NEOSTIGMINE METHYLSULFATE 1 MG/ML IJ SOLN
INTRAMUSCULAR | Status: DC | PRN
  Administered 2012-07-07: 4 mg via INTRAVENOUS

## 2012-07-07 MED ORDER — BISACODYL 10 MG RE SUPP: 10.0000 mg | Freq: Every day | RECTAL | Status: DC | PRN

## 2012-07-07 MED ORDER — METOCLOPRAMIDE HCL 5 MG/ML IJ SOLN: 5.0000 mg | Freq: Three times a day (TID) | INTRAMUSCULAR | Status: DC | PRN

## 2012-07-07 MED ORDER — METHOCARBAMOL 100 MG/ML IJ SOLN
500.0000 mg | Freq: Four times a day (QID) | INTRAVENOUS | Status: DC | PRN
  Administered 2012-07-07: 500 mg via INTRAVENOUS
  Filled 2012-07-07: qty 5

## 2012-07-07 MED ORDER — HYDROMORPHONE HCL PF 1 MG/ML IJ SOLN
0.2500 mg | INTRAMUSCULAR | Status: DC | PRN
  Administered 2012-07-07 (×3): 0.5 mg via INTRAVENOUS

## 2012-07-07 MED ORDER — STERILE WATER FOR IRRIGATION IR SOLN
Status: DC | PRN
  Administered 2012-07-07: 3000 mL

## 2012-07-07 MED ORDER — LIDOCAINE HCL (CARDIAC) 20 MG/ML IV SOLN
INTRAVENOUS | Status: DC | PRN
  Administered 2012-07-07: 50 mg via INTRAVENOUS

## 2012-07-07 MED ORDER — MENTHOL 3 MG MT LOZG: 1.0000 | LOZENGE | OROMUCOSAL | Status: DC | PRN

## 2012-07-07 MED ORDER — FLEET ENEMA 7-19 GM/118ML RE ENEM: 1.0000 | ENEMA | Freq: Once | RECTAL | Status: AC | PRN

## 2012-07-07 MED ORDER — ALPRAZOLAM 0.5 MG PO TABS
0.5000 mg | ORAL_TABLET | Freq: Every evening | ORAL | Status: DC | PRN
  Administered 2012-07-07 – 2012-07-08 (×2): 0.5 mg via ORAL
  Filled 2012-07-07 (×2): qty 1

## 2012-07-07 MED ORDER — FLUOXETINE HCL 20 MG PO CAPS
20.0000 mg | ORAL_CAPSULE | Freq: Every day | ORAL | Status: DC
  Administered 2012-07-08 – 2012-07-09 (×2): 20 mg via ORAL
  Filled 2012-07-07 (×3): qty 1

## 2012-07-07 MED ORDER — ACETAMINOPHEN 10 MG/ML IV SOLN
1000.0000 mg | Freq: Four times a day (QID) | INTRAVENOUS | Status: AC
  Administered 2012-07-07 – 2012-07-08 (×4): 1000 mg via INTRAVENOUS
  Filled 2012-07-07 (×6): qty 100

## 2012-07-07 MED ORDER — ACETAMINOPHEN 325 MG PO TABS: 650.0000 mg | ORAL_TABLET | Freq: Four times a day (QID) | ORAL | Status: DC | PRN

## 2012-07-07 MED ORDER — DIPHENHYDRAMINE HCL 12.5 MG/5ML PO ELIX: 12.5000 mg | ORAL_SOLUTION | ORAL | Status: DC | PRN

## 2012-07-07 MED ORDER — HYDROMORPHONE HCL PF 1 MG/ML IJ SOLN
INTRAMUSCULAR | Status: DC | PRN
  Administered 2012-07-07 (×4): 0.5 mg via INTRAVENOUS

## 2012-07-07 MED ORDER — ACETAMINOPHEN 10 MG/ML IV SOLN
INTRAVENOUS | Status: DC | PRN
  Administered 2012-07-07: 1000 mg via INTRAVENOUS

## 2012-07-07 MED ORDER — BUPIVACAINE LIPOSOME 1.3 % IJ SUSP
20.0000 mL | Freq: Once | INTRAMUSCULAR | Status: AC
  Administered 2012-07-07: 20 mL
  Filled 2012-07-07: qty 20

## 2012-07-07 MED ORDER — RIVAROXABAN 10 MG PO TABS
10.0000 mg | ORAL_TABLET | Freq: Every day | ORAL | Status: DC
  Filled 2012-07-07: qty 1

## 2012-07-07 MED ORDER — POLYETHYLENE GLYCOL 3350 17 G PO PACK
17.0000 g | PACK | Freq: Every day | ORAL | Status: DC | PRN
  Administered 2012-07-09: 17 g via ORAL

## 2012-07-07 SURGICAL SUPPLY — 50 items
BAG SPEC THK2 15X12 ZIP CLS (MISCELLANEOUS) ×1
BAG ZIPLOCK 12X15 (MISCELLANEOUS) ×2 IMPLANT
BIT DRILL 2.8X128 (BIT) ×2 IMPLANT
BLADE EXTENDED COATED 6.5IN (ELECTRODE) ×2 IMPLANT
BLADE SAW SAG 73X25 THK (BLADE) ×1
BLADE SAW SGTL 73X25 THK (BLADE) ×1 IMPLANT
CLOTH BEACON ORANGE TIMEOUT ST (SAFETY) ×2 IMPLANT
DECANTER SPIKE VIAL GLASS SM (MISCELLANEOUS) ×2 IMPLANT
DRAPE INCISE IOBAN 66X45 STRL (DRAPES) ×2 IMPLANT
DRAPE ORTHO SPLIT 77X108 STRL (DRAPES) ×4
DRAPE POUCH INSTRU U-SHP 10X18 (DRAPES) ×2 IMPLANT
DRAPE SURG ORHT 6 SPLT 77X108 (DRAPES) ×2 IMPLANT
DRAPE U-SHAPE 47X51 STRL (DRAPES) ×2 IMPLANT
DRSG ADAPTIC 3X8 NADH LF (GAUZE/BANDAGES/DRESSINGS) ×2 IMPLANT
DRSG MEPILEX BORDER 4X4 (GAUZE/BANDAGES/DRESSINGS) ×2 IMPLANT
DRSG MEPILEX BORDER 4X8 (GAUZE/BANDAGES/DRESSINGS) ×2 IMPLANT
DURAPREP 26ML APPLICATOR (WOUND CARE) ×2 IMPLANT
ELECT REM PT RETURN 9FT ADLT (ELECTROSURGICAL) ×2
ELECTRODE REM PT RTRN 9FT ADLT (ELECTROSURGICAL) ×1 IMPLANT
EVACUATOR 1/8 PVC DRAIN (DRAIN) ×2 IMPLANT
FACESHIELD LNG OPTICON STERILE (SAFETY) ×8 IMPLANT
GLOVE BIO SURGEON STRL SZ7.5 (GLOVE) ×2 IMPLANT
GLOVE BIO SURGEON STRL SZ8 (GLOVE) ×2 IMPLANT
GLOVE BIOGEL PI IND STRL 8 (GLOVE) ×2 IMPLANT
GLOVE BIOGEL PI INDICATOR 8 (GLOVE) ×2
GLOVE SURG SS PI 6.5 STRL IVOR (GLOVE) ×4 IMPLANT
GOWN STRL NON-REIN LRG LVL3 (GOWN DISPOSABLE) ×4 IMPLANT
GOWN STRL REIN XL XLG (GOWN DISPOSABLE) ×2 IMPLANT
IMMOBILIZER KNEE 20 (SOFTGOODS)
IMMOBILIZER KNEE 20 THIGH 36 (SOFTGOODS) IMPLANT
KIT BASIN OR (CUSTOM PROCEDURE TRAY) ×2 IMPLANT
MANIFOLD NEPTUNE II (INSTRUMENTS) ×2 IMPLANT
NDL SAFETY ECLIPSE 18X1.5 (NEEDLE) ×1 IMPLANT
NEEDLE HYPO 18GX1.5 SHARP (NEEDLE) ×2
NS IRRIG 1000ML POUR BTL (IV SOLUTION) ×2 IMPLANT
PACK TOTAL JOINT (CUSTOM PROCEDURE TRAY) ×2 IMPLANT
PASSER SUT SWANSON 36MM LOOP (INSTRUMENTS) ×2 IMPLANT
POSITIONER SURGICAL ARM (MISCELLANEOUS) ×2 IMPLANT
SPONGE GAUZE 4X4 12PLY (GAUZE/BANDAGES/DRESSINGS) ×2 IMPLANT
STRIP CLOSURE SKIN 1/2X4 (GAUZE/BANDAGES/DRESSINGS) ×4 IMPLANT
SUT ETHIBOND NAB CT1 #1 30IN (SUTURE) ×4 IMPLANT
SUT MNCRL AB 4-0 PS2 18 (SUTURE) ×2 IMPLANT
SUT VIC AB 2-0 CT1 27 (SUTURE) ×6
SUT VIC AB 2-0 CT1 TAPERPNT 27 (SUTURE) ×3 IMPLANT
SUT VLOC 180 0 24IN GS25 (SUTURE) ×4 IMPLANT
SYR 50ML LL SCALE MARK (SYRINGE) ×2 IMPLANT
TOWEL OR 17X26 10 PK STRL BLUE (TOWEL DISPOSABLE) ×4 IMPLANT
TOWEL OR NON WOVEN STRL DISP B (DISPOSABLE) ×2 IMPLANT
TRAY FOLEY CATH 14FRSI W/METER (CATHETERS) ×2 IMPLANT
WATER STERILE IRR 1500ML POUR (IV SOLUTION) ×2 IMPLANT

## 2012-07-07 NOTE — Transfer of Care (Signed)
Immediate Anesthesia Transfer of Care Note  Patient: Olivia Walker  Procedure(s) Performed: Procedure(s): TOTAL HIP ARTHROPLASTY (Right)  Patient Location: PACU  Anesthesia Type:General  Level of Consciousness: awake, alert  and oriented  Airway & Oxygen Therapy: Patient Spontanous Breathing and Patient connected to face mask oxygen  Post-op Assessment: Report given to PACU RN and Post -op Vital signs reviewed and stable  Post vital signs: Reviewed and stable  Complications: No apparent anesthesia complications

## 2012-07-07 NOTE — Anesthesia Preprocedure Evaluation (Addendum)
Anesthesia Evaluation  Patient identified by MRN, date of birth, ID band Patient awake    Reviewed: Allergy & Precautions, H&P , NPO status , Patient's Chart, lab work & pertinent test results  Airway Mallampati: II TM Distance: >3 FB Neck ROM: Full    Dental no notable dental hx.    Pulmonary neg pulmonary ROS,  breath sounds clear to auscultation  Pulmonary exam normal       Cardiovascular negative cardio ROS  Rhythm:Regular Rate:Normal     Neuro/Psych PSYCHIATRIC DISORDERS Anxiety Depression negative neurological ROS     GI/Hepatic Neg liver ROS, GERD-  Medicated,  Endo/Other  negative endocrine ROS  Renal/GU negative Renal ROS  negative genitourinary   Musculoskeletal negative musculoskeletal ROS (+)   Abdominal (+) + obese,   Peds negative pediatric ROS (+)  Hematology negative hematology ROS (+)   Anesthesia Other Findings   Reproductive/Obstetrics negative OB ROS                           Anesthesia Physical Anesthesia Plan  ASA: II  Anesthesia Plan: General   Post-op Pain Management:    Induction: Intravenous  Airway Management Planned: Oral ETT  Additional Equipment:   Intra-op Plan:   Post-operative Plan: Extubation in OR  Informed Consent: I have reviewed the patients History and Physical, chart, labs and discussed the procedure including the risks, benefits and alternatives for the proposed anesthesia with the patient or authorized representative who has indicated his/her understanding and acceptance.   Dental advisory given  Plan Discussed with: CRNA  Anesthesia Plan Comments: (Discussed general versus spinal. Questions answered. Plan general.)       Anesthesia Quick Evaluation

## 2012-07-07 NOTE — Anesthesia Postprocedure Evaluation (Signed)
  Anesthesia Post-op Note  Patient: Olivia Walker  Procedure(s) Performed: Procedure(s) (LRB): TOTAL HIP ARTHROPLASTY (Right)  Patient Location: PACU  Anesthesia Type: General  Level of Consciousness: awake and alert   Airway and Oxygen Therapy: Patient Spontanous Breathing  Post-op Pain: mild  Post-op Assessment: Post-op Vital signs reviewed, Patient's Cardiovascular Status Stable, Respiratory Function Stable, Patent Airway and No signs of Nausea or vomiting  Last Vitals:  Filed Vitals:   07/07/12 1345  BP:   Pulse: 83  Temp:   Resp: 19    Post-op Vital Signs: stable   Complications: No apparent anesthesia complications

## 2012-07-07 NOTE — Plan of Care (Signed)
Problem: Consults Goal: Diagnosis- Total Joint Replacement Primary Total Hip     

## 2012-07-07 NOTE — Interval H&P Note (Signed)
History and Physical Interval Note:  07/07/2012 10:11 AM  Olivia Walker  has presented today for surgery, with the diagnosis of oa right hip  The various methods of treatment have been discussed with the patient and family. After consideration of risks, benefits and other options for treatment, the patient has consented to  Procedure(s): TOTAL HIP ARTHROPLASTY (Right) as a surgical intervention .  The patient's history has been reviewed, patient examined, no change in status, stable for surgery.  I have reviewed the patient's chart and labs.  Questions were answered to the patient's satisfaction.     Loanne Drilling

## 2012-07-07 NOTE — H&P (View-Only) (Signed)
Olivia Walker  DOB: 11/04/1950 Single / Language: English / Race: White Female  Date of Admission:  07/07/2012  Chief Complaint:  Left Hip Pain and Bilateral Knee Pain  History of Present Illness The patient is a 62 year old female who comes in for a preoperative History and Physical. The patient is scheduled for a right total hip arthroplasty to be performed by Dr. Frank V. Aluisio, MD at [facility] on 07/07/2012. The patient was seen for a second opinion.The patient reports right hip problems including pain symptoms that have been present for 9 month(s) (or more). She also presents with knee complaints. The patient was seen for a second opinion. The patient reports left knee and right knee symptoms including: pain . Past treatment for this problem has included nonsteroidal anti-inflammatory drugs.The symptoms began without any known injury. Note for "Hip problem": She has had horrible groin pain. Her hip problem is much worse than the knee problems at this time. Her main problem now is the right hip pain. She has previously been treated by Dr. Rendall. He has told her in the past that she needed both knees replaced. She had knee arthroscopies in the past, in 1986 with a lateral release on the right, and in 2001 on the left. She was told she had significant arthritis in both knees at that time. The knees have been chronically painful, but for the past year she has developed worsening groin pain. It is hurting at all times now. It is now radiating to the buttock and radiating down the thigh to her knee. She has worse pain with activity but has pain at rest also. It's keeping her up at night. She has lost a lot of function and has a hard time bending. The left hip does not hurt. She is ready to proceed with her hip surgery. She would also like to get bilateral knee injections at the time of surgery. They have been treated conservatively in the past for the above stated  problem and despite conservative measures, they continue to have progressive pain and severe functional limitations and dysfunction. They have failed non-operative management including home exercise, medications. It is felt that they would benefit from undergoing total joint replacement. Risks and benefits of the procedure have been discussed with the patient and they elect to proceed with surgery. There are no active contraindications to surgery such as ongoing infection or rapidly progressive neurological disease.  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 mother Diabetes Mellitus. mother   Social History Marital status. single Number of flights of stairs before winded. less than 1 Pain Contract. no Exercise. Exercises rarely; does other Illicit drug use. no Living situation. live alone Tobacco use. never smoker Current work status. disabled Drug/Alcohol Rehab (Currently). no Drug/Alcohol Rehab (Previously). no Alcohol use. never consumed alcohol Children. 0 Post-Surgical Plans. Plan is to go home.   Medication History PROzac ( Oral) Specific dose unknown - Active. Norco (7.5-325MG Tablet, 1-2 Tablet Oral every 4-6 hours as needed for pain, Taken starting 06/14/2012) Active. (called to [facility] pharmacy [phone]) Wellbutrin XL (150MG Tablet ER 24HR, Oral) Active. Xanax (0.5MG Tablet, Oral at bedtime, as needed) Active.  Past Surgical History Arthroscopy of Knee. right Straighten Nasal Septum   Medical History Anxiety Disorder Chronic Pain Depression Kidney Stone Hemorrhoids   Review of Systems General:Not Present- Chills, Fever, Night Sweats, Appetite Loss, Fatigue, Feeling sick, Weight   Gain and Weight Loss. HEENT:Not Present- Sensitivity to light, Hearing problems, Nose Bleed and Ringing in the  Ears. Respiratory:Not Present- Snoring, Chronic Cough, Bloody sputum and Dyspnea. Cardiovascular:Present- Swelling of Extremities. Not Present- Shortness of Breath, Chest Pain, Leg Cramps and Palpitations. Gastrointestinal:Not Present- Bloody Stool, Heartburn, Abdominal Pain, Vomiting, Nausea and Incontinence of Stool. Female Genitourinary:Not Present- Blood in Urine, Menstrual Irregularities, Frequency, Incontinence and Nocturia. Musculoskeletal:Present- Joint Stiffness, Joint Pain and Back Pain. Not Present- Muscle Weakness, Muscle Pain and Joint Swelling. Neurological:Not Present- Tingling, Numbness, Burning, Tremor, Headaches and Dizziness. Psychiatric:Present- Anxiety and Depression. Not Present- Memory Loss. Hematology:Not Present- Abnormal Bleeding, Anemia, Blood Clots and Easy Bruising.   Vitals Weight: 229 lb Height: 65 in Body Surface Area: 2.18 m Body Mass Index: 38.11 kg/m Pulse: 76 (Regular) Resp.: 14 (Unlabored) BP: 134/88 (Sitting, Left Arm, Standard)    Physical Exam The physical exam findings are as follows:  Note: Patient is a 62 year old female with continued hip and knee pain.   General Mental Status - Alert, cooperative and good historian. General Appearance- pleasant. Not in acute distress. Orientation- Oriented X3. Build & Nutrition- Well nourished and Well developed.   Head and Neck Head- normocephalic, atraumatic . Neck Global Assessment- supple. no bruit auscultated on the right and no bruit auscultated on the left.   Eye Vision- Wears corrective lenses. Pupil- Bilateral- Regular and Round. Motion- Bilateral- EOMI.   Chest and Lung Exam Auscultation: Breath sounds:- clear at anterior chest wall and - clear at posterior chest wall. Adventitious sounds:- No Adventitious sounds.   Cardiovascular Auscultation:Rhythm- Regular rate and rhythm. Heart Sounds- S1 WNL and S2 WNL. Murmurs & Other Heart  Sounds:Auscultation of the heart reveals - No Murmurs.   Abdomen Inspection:Contour- Generalized moderate distention. Palpation/Percussion:Tenderness- Abdomen is non-tender to palpation. Rigidity (guarding)- Abdomen is soft. Auscultation:Auscultation of the abdomen reveals - Bowel sounds normal.   Female Genitourinary Not done, not pertinent to present illness  Musculoskeletal  On exam, well-developed female, very pleasant, alert and oriented, in no apparent distress. Evaluation of her left hip shows normal ROM and no discomfort. Right hip: Flexion 90. No internal rotation. About 10 external rotation, 20 abduction. Both knees show no effusion. There is marked crepitus on ROM of both knees. She has tenderness medial greater than lateral with no instability. Pulses, sensation and motor are intact distally.  RADIOGRAPHS: AP pelvis, lateral of the right hip show advanced end stage bone on bone arthritis of the right hip. The left hip looks normal. Both knees, AP and lateral, show that she does have significant joint space narrowing medially just about bone on bone in the medial and patellofemoral compartments.  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 Aluisio.  Plan is to go home.  PCP - Dr. Tisovec - Patient has been seen preoperatively and felt to be stable for surgery.  Drew Perkins, PA-C  

## 2012-07-07 NOTE — Op Note (Addendum)
Pre-operative diagnosis- Osteoarthritis Right hip  Post-operative diagnosis- Osteoarthritis  Right hip  Procedure-  RightTotal Hip Arthroplasty  Surgeon- Gus Rankin. Kenisha Lynds, MD  Assistant- Avel Peace, PA-C   Anesthesia  General  EBL- 300   Drain Hemovac   Complication- None  Condition-PACU - hemodynamically stable.   Brief Clinical Note-  Olivia Walker is a 62 y.o. female with end stage arthritis of her right hip with progressively worsening pain and dysfunction. Pain occurs with activity and rest including pain at night. She has tried analgesics, protected weight bearing and rest without benefit. Pain is too severe to attempt physical therapy. Radiographs demonstrate bone on bone arthritis with subchondral cyst formation. She presents now for right THA.  Procedure in detail-   The patient is brought into the operating room and placed on the operating table. After successful administration of General  anesthesia, the patient is placed in the  Left lateral decubitus position with the  Right side up and held in place with the hip positioner. The lower extremity is isolated from the perineum with plastic drapes and time-out is performed by the surgical team. The lower extremity is then prepped and draped in the usual sterile fashion. A short posterolateral incision is made with a ten blade through the subcutaneous tissue to the level of the fascia lata which is incised in line with the skin incision. The sciatic nerve is palpated and protected and the short external rotators and capsule are isolated from the femur. The hip is then dislocated and the center of the femoral head is marked. A trial prosthesis is placed such that the trial head corresponds to the center of the patients' native femoral head. The resection level is marked on the femoral neck and the resection is made with an oscillating saw. The femoral head is removed and femoral retractors placed to gain access to the femoral  canal.      The canal finder is passed into the femoral canal and the canal is thoroughly irrigated with sterile saline to remove the fatty contents. Axial reaming is performed to 11.5  mm, proximal reaming to 16 D  and the sleeve machined to a large. A 16 D large trial sleeve is placed into the proximal femur.      The femur is then retracted anteriorly to gain acetabular exposure. Acetabular retractors are placed and the labrum and osteophytes are removed, Acetabular reaming is performed to 49  mm and a 50  mm Pinnacle acetabular shell is placed in anatomic position with excellent purchase. Additional dome screws were not needed. The permanent 32 mm neutral + 4 Marathon liner is placed into the acetabular shell.      The trial femur is then placed into the femoral canal. The size is 16 x 11  stem with a 36 + 6  neck and a 32 + 0 head with the neck version matching  the patients' native anteversion. The hip is reduced with excellent stability with full extension and full external rotation, 70 degrees flexion with 40 degrees adduction and 90 degrees internal rotation and 90 degrees of flexion with 70 degrees of internal rotation. The operative leg is placed on top of the non-operative leg and the leg lengths are found to be equal. The trials are then removed and the permanent implant of the same size is impacted into the femoral canal. The  Ceramic femoral head of the same size as the trial is placed and the hip is reduced with the  same stability parameters. The operative leg is again placed on top of the non-operative leg and the leg lengths are found to be equal.      The wound is then copiously irrigated with saline solution and the capsule and short external rotators are re-attached to the femur through drill holes with Ethibond suture. The fascia lata is closed over a hemovac drain with #1 vicryl suture and the fascia lata, gluteal muscles and subcutaneous tissues are injected with Exparel 20ml diluted with  saline 50ml. The subcutaneous tissues are closed with #1 and2-0 vicryl and the subcuticular layer closed with running 4-0 Monocryl. The drain is hooked to suction, incision cleaned and dried, and steri-srips and a bulky sterile dressing applied. The limb is placed into a knee immobilizer and the patient is awakened and transported to recovery in stable condition.      Please note that a surgical assistant was a medical necessity for this procedure in order to perform it in a safe and expeditious manner. The assistant was necessary to provide retraction to the vital neurovascular structures and to retract and position the limb to allow for anatomic placement of the prosthetic components.  Gus Rankin Jonatan Wilsey, MD    07/07/2012, 12:14 PM  At the completion of the hip procedure she was placed supine on the operating table and after a sterile prep with Betadyne, each knee was injected with 5 ml of 1% Lidocaine and 40mg  of Depomedrol. She tolerated this without difficulty and was transported to recovery in stable conditon

## 2012-07-08 LAB — CBC
Hemoglobin: 11.4 g/dL — ABNORMAL LOW (ref 12.0–15.0)
MCH: 28.1 pg (ref 26.0–34.0)
MCHC: 31.9 g/dL (ref 30.0–36.0)
MCV: 88.1 fL (ref 78.0–100.0)
RBC: 4.05 MIL/uL (ref 3.87–5.11)

## 2012-07-08 LAB — BASIC METABOLIC PANEL
BUN: 9 mg/dL (ref 6–23)
CO2: 26 mEq/L (ref 19–32)
Calcium: 8.7 mg/dL (ref 8.4–10.5)
Creatinine, Ser: 0.79 mg/dL (ref 0.50–1.10)
GFR calc non Af Amer: 89 mL/min — ABNORMAL LOW (ref >90)
Glucose, Bld: 203 mg/dL — ABNORMAL HIGH (ref 70–99)
Sodium: 135 mEq/L (ref 135–145)

## 2012-07-08 MED ORDER — METHOCARBAMOL 500 MG PO TABS: 500.0000 mg | ORAL_TABLET | Freq: Four times a day (QID) | ORAL | Status: DC | PRN

## 2012-07-08 MED ORDER — TRAMADOL HCL 50 MG PO TABS: 50.0000 mg | ORAL_TABLET | Freq: Four times a day (QID) | ORAL | Status: DC | PRN

## 2012-07-08 MED ORDER — OXYCODONE HCL 5 MG PO TABS: 5.0000 mg | ORAL_TABLET | ORAL | Status: DC | PRN

## 2012-07-08 MED ORDER — RIVAROXABAN 10 MG PO TABS: 10.0000 mg | ORAL_TABLET | Freq: Every day | ORAL | Status: DC

## 2012-07-08 NOTE — Plan of Care (Signed)
Problem: Phase I Progression Outcomes Goal: Initial discharge plan identified Outcome: Completed/Met Date Met:  07/08/12 Pt states she is not married, has no children and her parents are deceased

## 2012-07-08 NOTE — Progress Notes (Signed)
   Subjective: 1 Day Post-Op Procedure(s) (LRB): TOTAL HIP ARTHROPLASTY (Right) Patient reports pain as mild.   Patient seen in rounds with Dr. Lequita Halt. Patient is well, and has had no acute complaints or problems We will start therapy today.  Plan is to go Home after hospital stay.  Objective: Vital signs in last 24 hours: Temp:  [97.9 F (36.6 C)-98.6 F (37 C)] 98.5 F (36.9 C) (02/13 0411) Pulse Rate:  [65-90] 81 (02/13 0411) Resp:  [12-21] 16 (02/13 0411) BP: (107-173)/(61-89) 120/76 mmHg (02/13 0411) SpO2:  [94 %-100 %] 98 % (02/13 0411) Weight:  [103.42 kg (228 lb)] 103.42 kg (228 lb) (02/13 0100)  Intake/Output from previous day:  Intake/Output Summary (Last 24 hours) at 07/08/12 0948 Last data filed at 07/08/12 0913  Gross per 24 hour  Intake 3587.5 ml  Output   2343 ml  Net 1244.5 ml    Intake/Output this shift: Total I/O In: 240 [P.O.:240] Out: 15 [Drains:15]  Labs:  Recent Labs  07/08/12 0403  HGB 11.4*    Recent Labs  07/08/12 0403  WBC 11.6*  RBC 4.05  HCT 35.7*  PLT 250    Recent Labs  07/08/12 0403  NA 135  K 4.4  CL 100  CO2 26  BUN 9  CREATININE 0.79  GLUCOSE 203*  CALCIUM 8.7   No results found for this basename: LABPT, INR,  in the last 72 hours  EXAM General - Patient is Alert, Appropriate and Oriented Extremity - Neurovascular intact Sensation intact distally Dorsiflexion/Plantar flexion intact Dressing - dressing C/D/I Motor Function - intact, moving foot and toes well on exam.  Hemovac pulled without difficulty.  Past Medical History  Diagnosis Date  . Arthritis   . Mental disorder   . Stones in the urinary tract   . GERD (gastroesophageal reflux disease)   . Anxiety   . Depression   . Chronic pain     knees, hips  . Hemorrhoids     mild    Assessment/Plan: 1 Day Post-Op Procedure(s) (LRB): TOTAL HIP ARTHROPLASTY (Right) Principal Problem:   OA (osteoarthritis) of hip  Estimated body mass index is  37.94 kg/(m^2) as calculated from the following:   Height as of this encounter: 5\' 5"  (1.651 m).   Weight as of this encounter: 103.42 kg (228 lb). Up with therapy Plan for discharge tomorrow Discharge home with home health  DVT Prophylaxis - Xarelto Weight Bearing As Tolerated right Leg D/C Knee Immobilizer Hemovac Pulled Begin Therapy Hip Preacutions No vaccines.  Patrica Duel 07/08/2012, 9:48 AM

## 2012-07-08 NOTE — Progress Notes (Signed)
Physical Therapy Treatment Patient Details Name: Olivia Walker MRN: [id number] DOB: 12-10-1950 Today's Date: 07/08/2012 Time: 4098-1191 PT Time Calculation (min): 9 min  PT Assessment / Plan / Recommendation Comments on Treatment Session  Pt. is moving well, has min c/o pain.    Follow Up Recommendations  Home health PT     Does the patient have the potential to tolerate intense rehabilitation     Barriers to Discharge        Equipment Recommendations  None recommended by PT    Recommendations for Other Services    Frequency 7X/week   Plan      Precautions / Restrictions Precautions Precautions: Posterior Hip Precaution Booklet Issued: Yes (comment) Precaution Comments: reviewed 3/3 posterior     Pertinent Vitals/Pain <4 R hip    Mobility  Transfers Transfers: Sit to Stand;Stand to Sit Sit to Stand: 4: Min guard;With armrests;With upper extremity assist;From chair/3-in-1 Stand to Sit: To chair/3-in-1;With armrests;With upper extremity assist;4: Min guard Details for Transfer Assistance: cues for hand and RLE placement. Ambulation/Gait Ambulation/Gait Assistance: 4: Min guard Ambulation Distance (Feet): 20 Feet Assistive device: Rolling walker Ambulation/Gait Assistance Details: cues for sequence. Gait Pattern: Step-through pattern;Decreased step length - right    Exercises     PT Diagnosis: Difficulty walking  PT Problem List: Decreased strength;Decreased range of motion;Decreased activity tolerance;Decreased mobility;Decreased knowledge of precautions;Pain PT Treatment Interventions: DME instruction;Gait training;Stair training;Functional mobility training;Therapeutic activities;Therapeutic exercise;Patient/family education   PT Goals Acute Rehab PT Goals PT Goal Formulation: With patient Time For Goal Achievement: 07/15/12 Potential to Achieve Goals: Good Pt will go Supine/Side to Sit: with supervision PT Goal: Supine/Side to Sit - Progress: Goal set  today Pt will go Sit to Supine/Side: with supervision PT Goal: Sit to Supine/Side - Progress: Goal set today Pt will go Sit to Stand: with supervision PT Goal: Sit to Stand - Progress: Progressing toward goal Pt will go Stand to Sit: with supervision PT Goal: Stand to Sit - Progress: Progressing toward goal Pt will Ambulate: >150 feet;with supervision;with rolling walker PT Goal: Ambulate - Progress: Progressing toward goal Pt will Go Up / Down Stairs: 3-5 stairs;with min assist;with least restrictive assistive device PT Goal: Up/Down Stairs - Progress: Goal set today Pt will Perform Home Exercise Program: with supervision, verbal cues required/provided PT Goal: Perform Home Exercise Program - Progress: Goal set today Additional Goals Additional Goal #1: state 3/3 post. Hip precautions. PT Goal: Additional Goal #1 - Progress: Goal set today  Visit Information  Last PT Received On: 07/08/12 Assistance Needed: +1    Subjective Data  Subjective: I need to get up Patient Stated Goal: to walk with no pain   Cognition  Cognition Overall Cognitive Status: Appears within functional limits for tasks assessed/performed Arousal/Alertness: Awake/alert Orientation Level: Appears intact for tasks assessed    Balance     End of Session PT - End of Session Activity Tolerance: Patient tolerated treatment well Patient left: Other (comment) (in bathroom for OT) Nurse Communication: Mobility status   GP     Rada Hay 07/08/2012, 2:26 PM

## 2012-07-08 NOTE — Evaluation (Signed)
Physical Therapy Evaluation Patient Details Name: Olivia Walker MRN: [id number] DOB: [birth year] Today's Date: 07/08/2012 Time: 3086-5784 PT Time Calculation (min): 37 min  PT Assessment / Plan / Recommendation Clinical Impression  Pt. admitted 2/12 for RTHA-posterior approach. Pt. ambulated in hall with min pain. Pt. plans DC to home with family assistance. Pt. has RW. continue PT while in acute care.    PT Assessment  Patient needs continued PT services    Follow Up Recommendations  Home health PT    Does the patient have the potential to tolerate intense rehabilitation      Barriers to Discharge        Equipment Recommendations  None recommended by PT    Recommendations for Other Services     Frequency 7X/week    Precautions / Restrictions Precautions Precautions: Posterior Hip   Pertinent Vitals/Pain States pain is 3/10 after meds.      Mobility  Bed Mobility Bed Mobility: Supine to Sit;Sit to Supine Supine to Sit: 4: Min assist Details for Bed Mobility Assistance: for RLE support. cues for precautions. Transfers Transfers: Sit to Stand;Stand to Sit Sit to Stand: 4: Min assist Stand to Sit: 4: Min guard Details for Transfer Assistance: cues for precautions. UE/RLE position  Ambulation/Gait Ambulation/Gait Assistance: 4: Min assist Ambulation Distance (Feet): 20 Feet (then 150) Assistive device: Rolling walker Gait Pattern: Step-through pattern;Decreased step length - right    Exercises     PT Diagnosis: Difficulty walking  PT Problem List: Decreased strength;Decreased range of motion;Decreased activity tolerance;Decreased mobility;Decreased knowledge of precautions;Pain PT Treatment Interventions: DME instruction;Gait training;Stair training;Functional mobility training;Therapeutic activities;Therapeutic exercise;Patient/family education   PT Goals Acute Rehab PT Goals PT Goal Formulation: With patient Time For Goal Achievement: 07/15/12 Potential to  Achieve Goals: Good Pt will go Supine/Side to Sit: with supervision PT Goal: Supine/Side to Sit - Progress: Goal set today Pt will go Sit to Supine/Side: with supervision PT Goal: Sit to Supine/Side - Progress: Goal set today Pt will go Sit to Stand: with supervision PT Goal: Sit to Stand - Progress: Goal set today Pt will go Stand to Sit: with supervision PT Goal: Stand to Sit - Progress: Goal set today Pt will Ambulate: >150 feet;with supervision;with rolling walker PT Goal: Ambulate - Progress: Goal set today Pt will Go Up / Down Stairs: 3-5 stairs;with min assist;with least restrictive assistive device PT Goal: Up/Down Stairs - Progress: Goal set today Pt will Perform Home Exercise Program: with supervision, verbal cues required/provided PT Goal: Perform Home Exercise Program - Progress: Goal set today Additional Goals Additional Goal #1: state 3/3 post. Hip precautions. PT Goal: Additional Goal #1 - Progress: Goal set today  Visit Information  Last PT Received On: 07/08/12 Assistance Needed: +1    Subjective Data  Subjective: I need to use the BR Patient Stated Goal: to walk with no pain   Prior Functioning  Home Living Lives With: Family Available Help at Discharge: Family;Friend(s) Type of Home: House Home Access: Stairs to enter [employer] of Steps: 3 and 3 Entrance Stairs-Rails: Right;Left Bathroom Shower/Tub: Engineer, manufacturing systems: Standard Home Adaptive Equipment: Environmental consultant - rolling;Straight cane;Crutches Prior Function Level of Independence: Independent with assistive device(s) Able to Take Stairs?: Yes Communication Communication: No difficulties    Cognition  Cognition Overall Cognitive Status: Appears within functional limits for tasks assessed/performed Arousal/Alertness: Awake/alert Orientation Level: Appears intact for tasks assessed    Extremity/Trunk Assessment Right Lower Extremity Assessment RLE ROM/Strength/Tone:  Deficits RLE ROM/Strength/Tone Deficits: pt able to  advance RLE, able to bear weight. RLE Sensation: WFL - Light Touch Left Lower Extremity Assessment LLE ROM/Strength/Tone: Within functional levels   Balance    End of Session PT - End of Session Activity Tolerance: Patient tolerated treatment well Patient left: in chair Nurse Communication: Mobility status  GP     Rada Hay 07/08/2012, 2:21 [id number]

## 2012-07-08 NOTE — Evaluation (Signed)
Occupational Therapy Evaluation Patient Details Name: Olivia Walker MRN: [id number] DOB: November 18, 1950 Today's Date: 07/08/2012 Time: 1317-1400 OT Time Calculation (min): 43 min  OT Assessment / Plan / Recommendation Clinical Impression  This 62 y.o. female admitted for Rt. post THA.  Pt. presents to OT with the below listed deficts and will benefit from OT to maximize safety and independence with BADLs to allow pt. to return home at modified independent level.  She will benefit from [facility] since she lives alone and will only have assistance initially    OT Assessment  Patient needs continued OT Services    Follow Up Recommendations  Home health OT;Supervision/Assistance - 24 hour    Barriers to Discharge None    Equipment Recommendations  3 in 1 bedside comode    Recommendations for Other Services    Frequency  Min 2X/week    Precautions / Restrictions Precautions Precautions: Posterior Hip Precaution Booklet Issued: Yes (comment) Precaution Comments: reviewed 3/3 posterior   Restrictions Weight Bearing Restrictions: No       ADL  Eating/Feeding: Independent Where Assessed - Eating/Feeding: Chair Grooming: Wash/dry hands;Wash/dry face;Brushing hair;Supervision/safety Where Assessed - Grooming: Supported standing Upper Body Bathing: Supervision/safety Where Assessed - Upper Body Bathing: Unsupported sitting Lower Body Bathing: Supervision/safety Where Assessed - Lower Body Bathing: Supported sit to stand Upper Body Dressing: Supervision/safety Where Assessed - Upper Body Dressing: Unsupported sitting Lower Body Dressing: Supervision/safety (with AE) Where Assessed - Lower Body Dressing: Supported sit to Pharmacist, hospital: Supervision/safety Statistician Method: Surveyor, minerals: Raised toilet seat with arms (or 3-in-1 over toilet) Toileting - Clothing Manipulation and Hygiene: Supervision/safety Where Assessed - Engineer, mining  and Hygiene: Standing Equipment Used: Reacher;Sock aid;Rolling walker Transfers/Ambulation Related to ADLs: Pt ambulates with supervision.  Requires cues to avoid turning and rotating over Rt. LE ADL Comments: Pt was instructed in use of AE.  She has a Sports administrator.  Discussed options for sock aid.  Reinforced THA precautions and length of time she will have those precautions.  Also verbally instructed pt on use of tub transfer bench    OT Diagnosis: Generalized weakness;Acute pain  OT Problem List: Decreased knowledge of use of DME or AE;Decreased knowledge of precautions;Pain OT Treatment Interventions: Self-care/ADL training;DME and/or AE instruction;Patient/family education   OT Goals Acute Rehab OT Goals OT Goal Formulation: With patient Time For Goal Achievement: 07/15/12 Potential to Achieve Goals: Good ADL Goals Pt Will Perform Lower Body Bathing: with supervision;Sit to stand from chair;Sit to stand from bed;with adaptive equipment ADL Goal: Lower Body Bathing - Progress: Goal set today Pt Will Perform Lower Body Dressing: with supervision;Sit to stand from chair;Sit to stand from bed;with adaptive equipment ADL Goal: Lower Body Dressing - Progress: Goal set today Pt Will Transfer to Toilet: with supervision;Ambulation;3-in-1;Maintaining hip precautions ADL Goal: Toilet Transfer - Progress: Goal set today Pt Will Perform Toileting - Clothing Manipulation: with supervision;Standing ADL Goal: Toileting - Clothing Manipulation - Progress: Goal set today Pt Will Perform Toileting - Hygiene: with supervision;Sit to stand from 3-in-1/toilet ADL Goal: Toileting - Hygiene - Progress: Goal set today Pt Will Perform Tub/Shower Transfer: with supervision;Transfer tub bench;Ambulation;Maintaining hip precautions ADL Goal: Tub/Shower Transfer - Progress: Goal set today Additional ADL Goal #1: Pt will be independent with THA precautions while performing set up for ADLs ADL Goal: Additional Goal #1  - Progress: Goal set today  Visit Information  Last OT Received On: 07/08/12 Assistance Needed: +1    Subjective Data  Subjective: "I  can get by for two weeks"  re: THA precautions.  Explained that THA prec. will be for at least 3 mos and to discuss with MD Patient Stated Goal: To go home   Prior Functioning     Home Living Lives With: Alone Available Help at Discharge: Family;Friend(s);Available 24 hours/day (initially) Type of Home: House Home Access: Stairs to enter [employer] of Steps: 3 and 3 Entrance Stairs-Rails: Right;Left Bathroom Shower/Tub: Forensic scientist: Standard Home Adaptive Equipment: Environmental consultant - rolling;Straight cane;Crutches;Reacher;Long-handled sponge (can borrow tub bench and 3-in-1) Prior Function Level of Independence: Independent with assistive device(s) Able to Take Stairs?: Yes Driving: Yes Communication Communication: No difficulties         Vision/Perception     Cognition  Cognition Overall Cognitive Status: Appears within functional limits for tasks assessed/performed Arousal/Alertness: Awake/alert Orientation Level: Appears intact for tasks assessed Behavior During Session: [facility] for tasks performed    Extremity/Trunk Assessment Right Upper Extremity Assessment RUE ROM/Strength/Tone: Within functional levels RUE Coordination: WFL - gross/fine motor Left Upper Extremity Assessment LUE ROM/Strength/Tone: Within functional levels LUE Coordination: WFL - gross/fine motor Right Lower Extremity Assessment RLE ROM/Strength/Tone: Deficits RLE ROM/Strength/Tone Deficits: pt able to advance RLE, able to bear weight. RLE Sensation: WFL - Light Touch Left Lower Extremity Assessment LLE ROM/Strength/Tone: Within functional levels Trunk Assessment Trunk Assessment: Normal     Mobility Bed Mobility Bed Mobility: Supine to Sit;Sit to Supine Supine to Sit: 4: Min assist Sit to Supine: 5: Supervision;HOB  flat Details for Bed Mobility Assistance: for RLE support. cues for precautions. Transfers Transfers: Sit to Stand;Stand to Sit Sit to Stand: 4: Min guard;With armrests;With upper extremity assist;From chair/3-in-1 Stand to Sit: To chair/3-in-1;With armrests;With upper extremity assist;4: Min guard Details for Transfer Assistance: cues for hand and RLE placement.     Exercise     Balance     End of Session OT - End of Session Activity Tolerance: Patient tolerated treatment well Patient left: in bed;with call bell/phone within reach;with family/visitor present  GO     Abir Eroh, Ursula Alert M 07/08/2012, 2:27 PM

## 2012-07-08 NOTE — Progress Notes (Signed)
Utilization review completed.  

## 2012-07-09 ENCOUNTER — Encounter (HOSPITAL_COMMUNITY): Payer: Self-pay | Admitting: Orthopedic Surgery

## 2012-07-09 LAB — BASIC METABOLIC PANEL
Chloride: 100 mEq/L (ref 96–112)
Creatinine, Ser: 0.73 mg/dL (ref 0.50–1.10)
GFR calc Af Amer: >90 mL/min (ref >90)
GFR calc non Af Amer: >90 mL/min (ref >90)
Potassium: 4.1 mEq/L (ref 3.5–5.1)

## 2012-07-09 LAB — CBC
MCV: 88 fL (ref 78.0–100.0)
Platelets: 243 10*3/uL (ref 150–400)
RDW: 13.8 % (ref 11.5–15.5)
WBC: 13.4 10*3/uL — ABNORMAL HIGH (ref 4.0–10.5)

## 2012-07-09 NOTE — Progress Notes (Signed)
Occupational Therapy Treatment Patient Details Name: Olivia Walker MRN: [id number] DOB: [birth year] Today's Date: 07/09/2012 Time: 2952-8413 OT Time Calculation (min): 24 min  OT Assessment / Plan / Recommendation Comments on Treatment Session      Follow Up Recommendations  Home health OT;Supervision/Assistance - 24 hour    Barriers to Discharge       Equipment Recommendations  3 in 1 bedside comode    Recommendations for Other Services    Frequency Min 2X/week   Plan      Precautions / Restrictions Precautions Precautions: Posterior Hip Restrictions Weight Bearing Restrictions: No   Pertinent Vitals/Pain No c/o pain in hip, back chronic pain.  Repositioned    ADL  Toilet Transfer: Supervision/safety Toilet Transfer Method: Sit to stand Toilet Transfer Equipment: Raised toilet seat with arms (or 3-in-1 over toilet) Transfers/Ambulation Related to ADLs: pt ambulated to bathroom.   ADL Comments: Educated on and demonstrated tub bench transfer:  pt will practice once she gets home (with HHOT). Pt will have help for adls.  Plans nightgowns and no pants initially.  Friend will help with socks/shoes.  Pt does have gopher reacher.  Reviewed hip precautioins in detail.  Reinforced need to stand for hygiene and OK to lift non operated leg up to position for comfort.  Pt likes to sidelie.  Recommended 2 pillows between legs. She is having a foam wedge made, also.  She already has foam.  Pt verbalizes understanding of all.      OT Diagnosis:    OT Problem List:   OT Treatment Interventions:     OT Goals ADL Goals Pt Will Transfer to Toilet: with supervision;Ambulation;3-in-1;Maintaining hip precautions ADL Goal: Toilet Transfer - Progress: Met Pt Will Perform Tub/Shower Transfer: with supervision;Transfer tub bench;Ambulation;Maintaining hip precautions ADL Goal: Tub/Shower Transfer - Progress: Other (comment) (verbalizes)  Visit Information  Last OT Received On:  07/09/12 Assistance Needed: +1    Subjective Data      Prior Functioning       Cognition  Cognition Overall Cognitive Status: Appears within functional limits for tasks assessed/performed Behavior During Session: [facility] for tasks performed    Mobility  Transfers Sit to Stand: 5: Supervision;From chair/3-in-1;With upper extremity assist Stand to Sit: 5: Supervision Details for Transfer Assistance: pt extended leg without cues.  Needs to work on controlling descent    Exercises      Balance     End of Session OT - End of Session Activity Tolerance: Patient tolerated treatment well Patient left: in chair;with call bell/phone within reach  GO     [facility] 07/09/2012, 9:12 AM Marica Otter, OTR/L [phone] 07/09/2012

## 2012-07-09 NOTE — Progress Notes (Signed)
Physical Therapy Treatment Patient Details Name: Olivia Walker MRN: [id number] DOB: 15-Apr-1951 Today's Date: 07/09/2012 Time: 7829-5621 PT Time Calculation (min): 38 min  PT Assessment / Plan / Recommendation Comments on Treatment Session  Pt improving in mobility, reviewed precautions, will review steps with family when arrive.    Follow Up Recommendations  Home health PT     Does the patient have the potential to tolerate intense rehabilitation     Barriers to Discharge        Equipment Recommendations  None recommended by PT    Recommendations for Other Services    Frequency 7X/week   Plan Discharge plan remains appropriate;Frequency remains appropriate    Precautions / Restrictions Precautions Precaution Comments: reviewed 3/3 posterior     Pertinent Vitals/Pain Sore in hip on R    Mobility  Bed Mobility Supine to Sit: 6: Modified independent (Device/Increase time) Sit to Supine: 5: Supervision;HOB flat Details for Bed Mobility Assistance: pt used a sheet to self assist RLE, cues for precautions Transfers Sit to Stand: 5: Supervision;With upper extremity assist;From chair/3-in-1 Stand to Sit: To bed;5: Supervision Details for Transfer Assistance: pt extended leg without cues.  Needs to work on controlling descent Ambulation/Gait Ambulation/Gait Assistance: 5: Supervision Ambulation Distance (Feet): 200 Feet Assistive device: Rolling walker Ambulation/Gait Assistance Details: cues for R leg position for turns, Gait Pattern: Step-through pattern;Decreased step length - right Stairs: Yes Stairs Assistance: 4: Min guard Stairs Assistance Details (indicate cue type and reason): verbally reviewed sequence, for going forward and backward, use cane or 1 ahnhold. Stair Management Technique: Two rails;Forwards;Backwards (cues for sequence.) Number of Stairs: 2    Exercises     PT Diagnosis:    PT Problem List:   PT Treatment Interventions:     PT Goals Acute Rehab  PT Goals Pt will go Sit to Supine/Side: with modified independence PT Goal: Sit to Supine/Side - Progress: Updated due to goal met Pt will go Sit to Stand: with modified independence PT Goal: Sit to Stand - Progress: Updated due to goal met Pt will go Stand to Sit: with modified independence PT Goal: Stand to Sit - Progress: Updated due to goals met Pt will Ambulate: >150 feet;with rolling walker;with supervision PT Goal: Ambulate - Progress: Met Pt will Go Up / Down Stairs: 3-5 stairs;with least restrictive assistive device;with min assist PT Goal: Up/Down Stairs - Progress: Progressing toward goal Pt will Perform Home Exercise Program: with supervision, verbal cues required/provided PT Goal: Perform Home Exercise Program - Progress: Progressing toward goal Additional Goals Additional Goal #1: state 3/3 post. Hip precautions. PT Goal: Additional Goal #1 - Progress: Progressing toward goal  Visit Information  Last PT Received On: 07/09/12 Assistance Needed: +1    Subjective Data      Cognition  Cognition Overall Cognitive Status: Appears within functional limits for tasks assessed/performed    Balance     End of Session PT - End of Session Activity Tolerance: Patient tolerated treatment well Patient left: in bed Nurse Communication: Mobility status   GP     Rada Hay 07/09/2012, 1:20 PM

## 2012-07-09 NOTE — Care Management Note (Signed)
    Page 1 of 2   07/09/2012     11:59:20 AM   CARE MANAGEMENT NOTE 07/09/2012  Patient:  Olivia Walker, Olivia Walker   Account Number:  [id number]  Date Initiated:  07/09/2012  Documentation initiated by:  Colleen Can  Subjective/Objective Assessment:   dx right hip replacemnt-     Action/Plan:   Cm spoke with patient. Plans are for her to return to hewr home where friends will be caregivers. She already has RW but will need 3n1. She is requesting [facility] for [facility] services.   Anticipated DC Date:  07/09/2012   Anticipated DC Plan:  HOME W HOME HEALTH SERVICES  In-house referral  NA      DC Planning Services  CM consult      [facility] Choice  HOME HEALTH  DURABLE MEDICAL EQUIPMENT   Choice offered to / List presented to:  C-1 Patient   DME arranged  3-N-1      DME agency  [facility]     HH arranged  HH-2 PT      [facility] agency  [facility]   Status of service:  Completed, signed off Medicare Important Message given?  NA - LOS <3 / Initial given by admissions (If response is "NO", the following Medicare IM given date fields will be blank) Date Medicare IM given:   Date Additional Medicare IM given:    Discharge Disposition:  HOME W HOME HEALTH SERVICES  Per UR Regulation:    If discussed at Long Length of Stay Meetings, dates discussed:    Comments:  07/09/2012 Colleen Can BSN RN CCM [phone] [facility] CALLED AND CAN PROVIDE hhpT SERVICES WITH STAQRT DATE OF TOMORROW. 3N1 WILL BE DELIVERED TO PT'S ROOM.

## 2012-07-09 NOTE — Progress Notes (Signed)
   Subjective: 2 Days Post-Op Procedure(s) (LRB): TOTAL HIP ARTHROPLASTY (Right) Patient reports pain as 2 on 0-10 scale.   Plan is to go Home after hospital stay.  Objective: Vital signs in last 24 hours: Temp:  [97.2 F (36.2 C)-98.7 F (37.1 C)] 98.4 F (36.9 C) (02/14 0405) Pulse Rate:  [74-88] 75 (02/14 0405) Resp:  [12-16] 12 (02/14 0405) BP: (137-149)/(80-84) 137/84 mmHg (02/14 0405) SpO2:  [93 %-96 %] 96 % (02/14 0405)  Intake/Output from previous day:  Intake/Output Summary (Last 24 hours) at 07/09/12 0907 Last data filed at 07/09/12 0740  Gross per 24 hour  Intake 1871.25 ml  Output   1300 ml  Net 571.25 ml    Intake/Output this shift: Total I/O In: 120 [P.O.:120] Out: -   Labs:  Recent Labs  07/08/12 0403 07/09/12 0400  HGB 11.4* 11.2*    Recent Labs  07/08/12 0403 07/09/12 0400  WBC 11.6* 13.4*  RBC 4.05 3.83*  HCT 35.7* 33.7*  PLT 250 243    Recent Labs  07/08/12 0403 07/09/12 0400  NA 135 136  K 4.4 4.1  CL 100 100  CO2 26 28  BUN 9 9  CREATININE 0.79 0.73  GLUCOSE 203* 160*  CALCIUM 8.7 8.9   No results found for this basename: LABPT, INR,  in the last 72 hours  EXAM General - Patient is Alert, Appropriate and Oriented Extremity - Neurologically intact Neurovascular intact Dorsiflexion/Plantar flexion intact Incision: dressing C/D/I No cellulitis present Compartment soft Dressing/Incision - clean, dry, no drainage Motor Function - intact, moving foot and toes well on exam.   Past Medical History  Diagnosis Date  . Arthritis   . Mental disorder   . Stones in the urinary tract   . GERD (gastroesophageal reflux disease)   . Anxiety   . Depression   . Chronic pain     knees, hips  . Hemorrhoids     mild    Assessment/Plan: 2 Days Post-Op Procedure(s) (LRB): TOTAL HIP ARTHROPLASTY (Right) Principal Problem:   OA (osteoarthritis) of hip   Advance diet Up with therapy D/C IV fluids Discharge home with home  health  DVT Prophylaxis - Xarelto Weight Bearing As Tolerated right Leg  Jansel Vonstein V 07/09/2012, 9:07 AM

## 2012-07-09 NOTE — Progress Notes (Signed)
Physical Therapy Treatment Patient Details Name: WINOLA DRUM MRN: [id number] DOB: 04/12/1951 Today's Date: 07/09/2012 Time: 4098-1191 PT Time Calculation (min): 20 min  PT Assessment / Plan / Recommendation Comments on Treatment Session  tolerated exercises well    Follow Up Recommendations  Home health PT     Does the patient have the potential to tolerate intense rehabilitation     Barriers to Discharge        Equipment Recommendations  None recommended by PT    Recommendations for Other Services    Frequency 7X/week   Plan Discharge plan remains appropriate;Frequency remains appropriate    Precautions / Restrictions Precautions Precaution Comments: reviewed 3/3 posterior     Pertinent Vitals/Pain     Mobility    Exercises Total Joint Exercises Quad Sets: AROM;Right;10 reps;Supine Short Arc Quad: AROM;Right;10 reps;Supine Heel Slides: AAROM;Right;10 reps;Supine Hip ABduction/ADduction: AAROM;Right;10 reps;Supine   PT Diagnosis:    PT Problem List:   PT Treatment Interventions:     PT Goals Acute Rehab PT Goals Pt will go Sit to Supine/Side: with modified independence PT Goal: Sit to Supine/Side - Progress: Updated due to goal met Pt will go Sit to Stand: with modified independence PT Goal: Sit to Stand - Progress: Updated due to goal met Pt will go Stand to Sit: with modified independence PT Goal: Stand to Sit - Progress: Updated due to goals met Pt will Ambulate: >150 feet;with rolling walker;with supervision PT Goal: Ambulate - Progress: Met Pt will Go Up / Down Stairs: 3-5 stairs;with least restrictive assistive device;with min assist PT Goal: Up/Down Stairs - Progress: Progressing toward goal Pt will Perform Home Exercise Program: with supervision, verbal cues required/provided PT Goal: Perform Home Exercise Program - Progress: Progressing toward goal Additional Goals Additional Goal #1: state 3/3 post. Hip precautions. PT Goal: Additional Goal #1 -  Progress: Progressing toward goal  Visit Information  Last PT Received On: 07/09/12 Assistance Needed: +1    Subjective Data  Subjective: I am ready to go   Cognition  Cognition Overall Cognitive Status: Appears within functional limits for tasks assessed/performed    Balance     End of Session PT - End of Session Activity Tolerance: Patient tolerated treatment well Patient left: in bed Nurse Communication: Mobility status   GP     Rada Hay 07/09/2012, 1:23 PM

## 2012-07-09 NOTE — Progress Notes (Signed)
Physical Therapy Treatment Patient Details Name: Olivia Walker MRN: [id number] DOB: 05/05/1951 Today's Date: 07/09/2012 Time: 4098-1191 PT Time Calculation (min): 17 min  PT Assessment / Plan / Recommendation Comments on Treatment Session  Pt/family have practiced steps. ready for DC.    Follow Up Recommendations  Home health PT     Does the patient have the potential to tolerate intense rehabilitation     Barriers to Discharge        Equipment Recommendations  None recommended by PT    Recommendations for Other Services    Frequency 7X/week   Plan Discharge plan remains appropriate;Frequency remains appropriate    Precautions / Restrictions Precautions Precaution Comments: reviewed 3/3 posterior     Pertinent Vitals/Pain Sore after exercises.   Mobility  Bed Mobility Supine to Sit: 6: Modified independent (Device/Increase time) (with Leg lifter.) Sit to Supine: 5: Supervision;HOB flat Details for Bed Mobility Assistance: pt used asheet to self assist RLE, cues for precautions Transfers Sit to Stand: 5: Supervision;With upper extremity assist;From chair/3-in-1 Stand to Sit: To bed;5: Supervision Details for Transfer Assistance: pt extended leg without cues.  Needs to work on controlling descent Ambulation/Gait Ambulation/Gait Assistance: 5: Supervision Ambulation Distance (Feet): 20 Feet Assistive device: Rolling walker Ambulation/Gait Assistance Details: cues for R leg  Gait Pattern: Step-through pattern;Decreased step length - right Stairs: Yes Stairs Assistance: 4: Min guard Stairs Assistance Details (indicate cue type and reason): family present to assist pt on steps. Pt. will use crutch and rail at home. Stair Management Technique: Two rails;Forwards;Backwards Number of Stairs: 2    Exercises    PT Diagnosis:    PT Problem List:   PT Treatment Interventions:     PT Goals Acute Rehab PT Goals Pt will go Supine/Side to Sit: with supervision PT Goal:  Supine/Side to Sit - Progress: Met Pt will go Sit to Supine/Side: with modified independence PT Goal: Sit to Supine/Side - Progress: Updated due to goal met Pt will go Sit to Stand: with modified independence PT Goal: Sit to Stand - Progress: Progressing toward goal Pt will go Stand to Sit: with modified independence PT Goal: Stand to Sit - Progress: Progressing toward goal Pt will Ambulate: >150 feet;with rolling walker;with supervision PT Goal: Ambulate - Progress: Progressing toward goal Pt will Go Up / Down Stairs: 3-5 stairs;with least restrictive assistive device;with min assist PT Goal: Up/Down Stairs - Progress: Progressing toward goal Pt will Perform Home Exercise Program: with supervision, verbal cues required/provided PT Goal: Perform Home Exercise Program - Progress: Progressing toward goal Additional Goals Additional Goal #1: state 3/3 post. Hip precautions. PT Goal: Additional Goal #1 - Progress: Progressing toward goal  Visit Information  Last PT Received On: 07/09/12 Assistance Needed: +1    Subjective Data  Subjective: I will do the steps   Cognition  Cognition Overall Cognitive Status: Appears within functional limits for tasks assessed/performed    Balance     End of Session PT - End of Session Activity Tolerance: Patient tolerated treatment well Patient left: in bed Nurse Communication: Mobility status   GP     Rada Hay 07/09/2012, 1:29 PM

## 2012-07-20 NOTE — Discharge Summary (Signed)
Physician Discharge Summary   Patient ID: Olivia Walker MRN: [id number] DOB/AGE: [birth year] 62 y.o.  Admit date: 07/07/2012 Discharge date: 07/09/2012  Primary Diagnosis:  Osteoarthritis Right hip  Admission Diagnoses:  Past Medical History  Diagnosis Date  . Arthritis   . Mental disorder   . Stones in the urinary tract   . GERD (gastroesophageal reflux disease)   . Anxiety   . Depression   . Chronic pain     knees, hips  . Hemorrhoids     mild   Discharge Diagnoses:   Principal Problem:   OA (osteoarthritis) of hip  Estimated body mass index is 60.94 kg/(m^2) as calculated from the following:   Height as of this encounter: 5\' 5"  (1.651 m).   Weight as of this encounter: 103.42 kg (228 lb).  Classification of overweight in adults according to BMI (WHO, 1998)   Procedure: Procedure(s) (LRB): TOTAL HIP ARTHROPLASTY (Right)   Consults: None  HPI: Olivia Walker is a 62 y.o. female with end stage arthritis of her right hip with progressively worsening pain and dysfunction. Pain occurs with activity and rest including pain at night. She has tried analgesics, protected weight bearing and rest without benefit. Pain is too severe to attempt physical therapy. Radiographs demonstrate bone on bone arthritis with subchondral cyst formation. She presents now for right THA.  Laboratory Data: Admission on 07/07/2012, Discharged on 07/09/2012  Component Date Value Range Status  . WBC 07/08/2012 11.6* 4.0 - 10.5 K/uL Final  . RBC 07/08/2012 4.05  3.87 - 5.11 MIL/uL Final  . Hemoglobin 07/08/2012 11.4* 12.0 - 15.0 g/dL Final  . HCT 08/65/7846 35.7* 36.0 - 46.0 % Final  . MCV 07/08/2012 88.1  78.0 - 100.0 fL Final  . MCH 07/08/2012 28.1  26.0 - 34.0 pg Final  . MCHC 07/08/2012 31.9  30.0 - 36.0 g/dL Final  . RDW 96/29/5284 13.5  11.5 - 15.5 % Final  . Platelets 07/08/2012 250  150 - 400 K/uL Final  . Sodium 07/08/2012 135  135 - 145 mEq/L Final  . Potassium 07/08/2012 4.4  3.5 - 5.1  mEq/L Final  . Chloride 07/08/2012 100  96 - 112 mEq/L Final  . CO2 07/08/2012 26  19 - 32 mEq/L Final  . Glucose, Bld 07/08/2012 203* 70 - 99 mg/dL Final  . BUN 13/24/4010 9  6 - 23 mg/dL Final  . Creatinine, Ser 07/08/2012 0.79  0.50 - 1.10 mg/dL Final  . Calcium 27/25/3664 8.7  8.4 - 10.5 mg/dL Final  . GFR calc non Af Amer 07/08/2012 89* >90 mL/min Final  . GFR calc Af Amer 07/08/2012 >90  >90 mL/min Final   Comment:                                 The eGFR has been calculated                          using the CKD EPI equation.                          This calculation has not been                          validated in all clinical  situations.                          eGFR's persistently                          <90 mL/min signify                          possible Chronic Kidney Disease.  . WBC 07/09/2012 13.4* 4.0 - 10.5 K/uL Final  . RBC 07/09/2012 3.83* 3.87 - 5.11 MIL/uL Final  . Hemoglobin 07/09/2012 11.2* 12.0 - 15.0 g/dL Final  . HCT 40/98/1191 33.7* 36.0 - 46.0 % Final  . MCV 07/09/2012 88.0  78.0 - 100.0 fL Final  . MCH 07/09/2012 29.2  26.0 - 34.0 pg Final  . MCHC 07/09/2012 33.2  30.0 - 36.0 g/dL Final  . RDW 47/82/9562 13.8  11.5 - 15.5 % Final  . Platelets 07/09/2012 243  150 - 400 K/uL Final  . Sodium 07/09/2012 136  135 - 145 mEq/L Final  . Potassium 07/09/2012 4.1  3.5 - 5.1 mEq/L Final  . Chloride 07/09/2012 100  96 - 112 mEq/L Final  . CO2 07/09/2012 28  19 - 32 mEq/L Final  . Glucose, Bld 07/09/2012 160* 70 - 99 mg/dL Final  . BUN 13/12/6576 9  6 - 23 mg/dL Final  . Creatinine, Ser 07/09/2012 0.73  0.50 - 1.10 mg/dL Final  . Calcium 46/96/2952 8.9  8.4 - 10.5 mg/dL Final  . GFR calc non Af Amer 07/09/2012 >90  >90 mL/min Final  . GFR calc Af Amer 07/09/2012 >90  >90 mL/min Final   Comment:                                 The eGFR has been calculated                          using the CKD EPI equation.                           This calculation has not been                          validated in all clinical                          situations.                          eGFR's persistently                          <90 mL/min signify                          possible Chronic Kidney Disease.  Hospital Outpatient Visit on 07/01/2012  Component Date Value Range Status  . MRSA, PCR 07/01/2012 NEGATIVE  NEGATIVE Final  . Staphylococcus aureus 07/01/2012 NEGATIVE  NEGATIVE Final   Comment:                                 The  Xpert SA Assay (FDA                          approved for NASAL specimens                          in patients over 103 years of age),                          is one component of                          a comprehensive surveillance                          program.  Test performance has                          been validated by [employer] for patients greater                          than or equal to 94 year old.                          It is not intended                          to diagnose infection nor to                          guide or monitor treatment.  Marland Kitchen aPTT 07/01/2012 27  24 - 37 seconds Final  . WBC 07/01/2012 8.8  4.0 - 10.5 K/uL Final  . RBC 07/01/2012 4.84  3.87 - 5.11 MIL/uL Final  . Hemoglobin 07/01/2012 13.9  12.0 - 15.0 g/dL Final  . HCT 40/98/1191 42.3  36.0 - 46.0 % Final  . MCV 07/01/2012 87.4  78.0 - 100.0 fL Final  . MCH 07/01/2012 28.7  26.0 - 34.0 pg Final  . MCHC 07/01/2012 32.9  30.0 - 36.0 g/dL Final  . RDW 47/82/9562 13.4  11.5 - 15.5 % Final  . Platelets 07/01/2012 249  150 - 400 K/uL Final  . Sodium 07/01/2012 139  135 - 145 mEq/L Final  . Potassium 07/01/2012 4.8  3.5 - 5.1 mEq/L Final  . Chloride 07/01/2012 102  96 - 112 mEq/L Final  . CO2 07/01/2012 29  19 - 32 mEq/L Final  . Glucose, Bld 07/01/2012 123* 70 - 99 mg/dL Final  . BUN 13/12/6576 12  6 - 23 mg/dL Final  . Creatinine, Ser 07/01/2012 0.91  0.50 - 1.10 mg/dL Final  .  Calcium 46/96/2952 9.6  8.4 - 10.5 mg/dL Final  . Total Protein 07/01/2012 7.6  6.0 - 8.3 g/dL Final  . Albumin 84/13/2440 3.6  3.5 - 5.2 g/dL Final  . AST 03/22/2535 17  0 - 37 U/L Final  . ALT 07/01/2012 12  0 - 35 U/L Final  . Alkaline Phosphatase 07/01/2012 71  39 - 117 U/L Final  . Total Bilirubin 07/01/2012 0.3  0.3 - 1.2 mg/dL Final  .  GFR calc non Af Amer 07/01/2012 68* >90 mL/min Final  . GFR calc Af Amer 07/01/2012 78* >90 mL/min Final   Comment:                                 The eGFR has been calculated                          using the CKD EPI equation.                          This calculation has not been                          validated in all clinical                          situations.                          eGFR's persistently                          <90 mL/min signify                          possible Chronic Kidney Disease.  Marland Kitchen Prothrombin Time 07/01/2012 12.6  11.6 - 15.2 seconds Final  . INR 07/01/2012 0.95  0.00 - 1.49 Final  . ABO/RH(D) 07/01/2012 A POS   Final  . Antibody Screen 07/01/2012 NEG   Final  . Sample Expiration 07/01/2012 07/10/2012   Final  . Color, Urine 07/01/2012 AMBER* YELLOW Final   BIOCHEMICALS MAY BE AFFECTED BY COLOR  . APPearance 07/01/2012 CLOUDY* CLEAR Final  . Specific Gravity, Urine 07/01/2012 1.028  1.005 - 1.030 Final  . pH 07/01/2012 5.0  5.0 - 8.0 Final  . Glucose, UA 07/01/2012 NEGATIVE  NEGATIVE mg/dL Final  . Hgb urine dipstick 07/01/2012 NEGATIVE  NEGATIVE Final  . Bilirubin Urine 07/01/2012 SMALL* NEGATIVE Final  . Ketones, ur 07/01/2012 TRACE* NEGATIVE mg/dL Final  . Protein, ur 16/02/9603 NEGATIVE  NEGATIVE mg/dL Final  . Urobilinogen, UA 07/01/2012 1.0  0.0 - 1.0 mg/dL Final  . Nitrite 54/01/8118 NEGATIVE  NEGATIVE Final  . Leukocytes, UA 07/01/2012 LARGE* NEGATIVE Final  . Squamous Epithelial / LPF 07/01/2012 FEW* RARE Final  . WBC, UA 07/01/2012 21-50  <3 WBC/hpf Final  . RBC / HPF 07/01/2012 0-2  <3 RBC/hpf  Final  . Bacteria, UA 07/01/2012 MANY* RARE Final  . Urine-Other 07/01/2012 MUCOUS PRESENT   Final  . Specimen Description 07/01/2012 URINE, CLEAN CATCH   Final  . Special Requests 07/01/2012 NONE   Final  . Culture  Setup Time 07/01/2012 07/01/2012 20:19   Final  . Colony Count 07/01/2012 3,000 COLONIES/ML   Final  . Culture 07/01/2012 INSIGNIFICANT GROWTH   Final  . Report Status 07/01/2012 07/02/2012 FINAL   Final  . ABO/RH(D) 07/01/2012 A POS   Final     X-Rays:Dg Hip Complete Right  07/01/2012  *RADIOLOGY REPORT*  Clinical Data: Preoperative evaluation for right total hip replacement  RIGHT HIP - COMPLETE 2+ VIEW  Comparison: None.  Findings: Bone density is within normal limits for age. There is marked joint space narrowing identified of the  right hip with associated subchondral sclerosis of the acetabulum and femoral head and acetabular subchondral cyst formation.  Mild acetabular osteophytosis is seen.  The left hip demonstrates a mild degree of superior joint space narrowing.  No significant associated subchondral sclerosis or osteophytosis is seen on this side.  The sacroiliac joints and sacral white lines appear intact. No acute fracture or worrisome focal bony lesion is otherwise seen.  IMPRESSION: Bilateral degenerative hip changes as described above.   Original Report Authenticated By: Rhodia Albright, M.D.    Dg Pelvis Portable  07/07/2012  *RADIOLOGY REPORT*  Clinical Data: Osteoarthritis  PORTABLE PELVIS,PORTABLE RIGHT HIP - 1 VIEW  Comparison: 07/01/2012  Findings: Portable pelvis and hip films demonstrate satisfactory appearance status post right total hip arthroplasty.  Surgical drain good position.  IMPRESSION: Satisfactory position and alignment status post right THA.   Original Report Authenticated By: Davonna Belling, M.D.    Dg Hip Portable 1 View Right  07/07/2012  *RADIOLOGY REPORT*  Clinical Data: Osteoarthritis  PORTABLE PELVIS,PORTABLE RIGHT HIP - 1 VIEW  Comparison:  07/01/2012  Findings: Portable pelvis and hip films demonstrate satisfactory appearance status post right total hip arthroplasty.  Surgical drain good position.  IMPRESSION: Satisfactory position and alignment status post right THA.   Original Report Authenticated By: Davonna Belling, M.D.     EKG: Orders placed during the hospital encounter of 12/29/11  . EKG 12-LEAD  . EKG 12-LEAD     Hospital Course: Patient was admitted to [facility] and taken to the OR and underwent the above state procedure without complications.  Patient tolerated the procedure well and was later transferred to the recovery room and then to the orthopaedic floor for postoperative care.  They were given PO and IV analgesics for pain control following their surgery.  They were given 24 hours of postoperative antibiotics of  Anti-infectives   Start     Dose/Rate Route Frequency Ordered Stop   07/07/12 2330  vancomycin (VANCOCIN) IVPB 1000 mg/200 mL premix    Comments:  Pre-op abx given 2/12 @ 1103. AET: 1250   1,000 mg 200 mL/hr over 60 Minutes Intravenous Every 12 hours 07/07/12 1443 07/07/12 2342   07/07/12 0600  vancomycin (VANCOCIN) 1,500 mg in sodium chloride 0.9 % 500 mL IVPB     1,500 mg 250 mL/hr over 120 Minutes Intravenous On call to O.R. 07/06/12 1654 07/07/12 1103     and started on DVT prophylaxis in the form of Xarelto.   PT and OT were ordered for total hip protocol.  The patient was allowed to be WBAT with therapy. Discharge planning was consulted to help with postop disposition and equipment needs.  Patient had a decent night on the evening of surgery and started to get up OOB with therapy on day one and walked about 20 feet.  Hemovac drain was pulled without difficulty.  The knee immobilizer was removed and discontinued.  Continued to work with therapy into day two.  Dressing was changed on day two and the incision was healing well.  Patient was seen in rounds and was ready to go home later that same  day.   Discharge Medications: Prior to Admission medications   Medication Sig Start Date End Date Taking? Authorizing Provider  ALPRAZolam Prudy Feeler) 0.5 MG tablet Take 0.5 mg by mouth at bedtime as needed. Anxiety/sleep   Yes Historical Provider, MD  buPROPion (WELLBUTRIN XL) 150 MG 24 hr tablet Take 150 mg by mouth daily before breakfast.   Yes  Historical Provider, MD  famotidine (PEPCID) 20 MG tablet Take 20 mg by mouth as needed. Acid   Yes Historical Provider, MD  FLUoxetine (PROZAC) 20 MG capsule Take 20 mg by mouth daily before breakfast.    Yes Historical Provider, MD  methocarbamol (ROBAXIN) 500 MG tablet Take 1 tablet (500 mg total) by mouth every 6 (six) hours as needed. 07/08/12   Loanne Drilling, MD  oxyCODONE (OXY IR/ROXICODONE) 5 MG immediate release tablet Take 1-2 tablets (5-10 mg total) by mouth every 3 (three) hours as needed. 07/08/12   Loanne Drilling, MD  polyethylene glycol (MIRALAX / Ethelene Hal) packet Take 17 g by mouth daily. 07/01/12   Historical Provider, MD  rivaroxaban (XARELTO) 10 MG TABS tablet Take 1 tablet (10 mg total) by mouth daily with breakfast. 07/08/12   Loanne Drilling, MD  traMADol (ULTRAM) 50 MG tablet Take 1-2 tablets (50-100 mg total) by mouth every 6 (six) hours as needed. 07/08/12   Loanne Drilling, MD    Diet: Regular diet Activity:WBAT No bending hip over 90 degrees- A "L" Angle Do not cross legs Do not let foot roll inward When turning these patients a pillow should be placed between the patient's legs to prevent crossing. Patients should have the affected knee fully extended when trying to sit or stand from all surfaces to prevent excessive hip flexion. When ambulating and turning toward the affected side the affected leg should have the toes turned out prior to moving the walker and the rest of patient's body as to prevent internal rotation/ turning in of the leg. Abduction pillows are the most effective way to prevent a patient from not crossing  legs or turning toes in at rest. If an abduction pillow is not ordered placing a regular pillow length wise between the patient's legs is also an effective reminder. It is imperative that these precautions be maintained so that the surgical hip does not dislocate. Follow-up:in 2 weeks Disposition - Home Discharged Condition: good      Medication List    STOP taking these medications       HYDROcodone-acetaminophen 7.5-325 MG per tablet  Commonly known as:  NORCO      TAKE these medications       ALPRAZolam 0.5 MG tablet  Commonly known as:  XANAX  Take 0.5 mg by mouth at bedtime as needed. Anxiety/sleep     buPROPion 150 MG 24 hr tablet  Commonly known as:  WELLBUTRIN XL  Take 150 mg by mouth daily before breakfast.     famotidine 20 MG tablet  Commonly known as:  PEPCID  Take 20 mg by mouth as needed. Acid     FLUoxetine 20 MG capsule  Commonly known as:  PROZAC  Take 20 mg by mouth daily before breakfast.     methocarbamol 500 MG tablet  Commonly known as:  ROBAXIN  Take 1 tablet (500 mg total) by mouth every 6 (six) hours as needed.     oxyCODONE 5 MG immediate release tablet  Commonly known as:  Oxy IR/ROXICODONE  Take 1-2 tablets (5-10 mg total) by mouth every 3 (three) hours as needed.     polyethylene glycol packet  Commonly known as:  MIRALAX / GLYCOLAX  Take 17 g by mouth daily.     rivaroxaban 10 MG Tabs tablet  Commonly known as:  XARELTO  Take 1 tablet (10 mg total) by mouth daily with breakfast.     traMADol 50 MG tablet  Commonly known as:  ULTRAM  Take 1-2 tablets (50-100 mg total) by mouth every 6 (six) hours as needed.           Follow-up Information   Follow up with Loanne Drilling, MD. Schedule an appointment as soon as possible for a visit on 07/22/2012. (Call [phone] Monday to make the appointment)    Contact information:   [address] [place] [address] [phone]        Signed: Patrica Duel 07/20/2012, 9:11 AM

## 2012-09-20 ENCOUNTER — Telehealth: Payer: Self-pay | Admitting: *Deleted

## 2012-09-20 NOTE — Telephone Encounter (Signed)
Message left that we have received a referral for a sleep consult from Dr. Wylene Simmer.  Please call to schedule with Dr. Vickey Huger.

## 2012-10-08 ENCOUNTER — Telehealth: Payer: Self-pay | Admitting: Neurology

## 2012-10-08 ENCOUNTER — Encounter: Payer: Self-pay | Admitting: Neurology

## 2012-10-08 ENCOUNTER — Ambulatory Visit (INDEPENDENT_AMBULATORY_CARE_PROVIDER_SITE_OTHER): Payer: Medicare Other | Admitting: Neurology

## 2012-10-08 VITALS — BP 112/72 | HR 76 | Ht 65.0 in | Wt 215.0 lb

## 2012-10-08 DIAGNOSIS — R0683 Snoring: Secondary | ICD-10-CM

## 2012-10-08 DIAGNOSIS — F341 Dysthymic disorder: Secondary | ICD-10-CM

## 2012-10-08 DIAGNOSIS — G4700 Insomnia, unspecified: Secondary | ICD-10-CM | POA: Insufficient documentation

## 2012-10-08 DIAGNOSIS — F418 Other specified anxiety disorders: Secondary | ICD-10-CM

## 2012-10-08 DIAGNOSIS — R0609 Other forms of dyspnea: Secondary | ICD-10-CM

## 2012-10-08 HISTORY — DX: Other specified anxiety disorders: F41.8

## 2012-10-08 NOTE — Progress Notes (Signed)
[facility]  Provider:  Dr Sequoia Mincey Referring Provider: Wylene Simmer Adelfa Koh, MD Primary Care Physician:  Gaspar Garbe, MD  Chief Complaint  Patient presents with  . New Evaluation    [place], hypersomnia,rm 10    HPI:  Olivia Walker is a 62 y.o. female here as a referral from Dr. Wylene Simmer for excessive daytime sleepiness-fatigue chronic nocturnal insomnia, nocturia, nonrestorative sleep due to limb movements snoring and frequent awakenings. Dr.  Bishop Dublin like the patient to undergo a split-night study. She has been using Seroquel  for many years in the past ,which caused significant  weight gain. She is often unable to fall  asleep ,is not able to stay  Asleep. This is going on for 3-4 decades.   This chronically depressed patient has been treated on Prozac for 19 years , at  variable doses. The patient 's depression also manifests as anxiety.  She is considered : prediabetic, hypercholesterolemic, and she has a past surgical history of CTS in 1990, right knee surgery 1985, left knee 2000 and gallbladder removed in 2013, and total right hip replacement  with  Dr.Aluisio 2014.   Review of Systems: Out of a complete 14 system review, the patient complains of only the following symptoms, and all other reviewed systems are negative. EDS : 'I nap, but I can control  It "  endorses the  Epworth at  3, chronic insomnia with average  Sleep duration of  3 hours ( subjective ) . She has nocturia , snoring. Is morbidly  Obese.   Patient has  Independently decided to take higher doses and mix sleep aids.   History   Social History  . Marital Status: Single    Spouse Name: N/A    Number of Children: N/A  . Years of Education: 12   Occupational History  .     Social History Main Topics  . Smoking status: Never Smoker   . Smokeless tobacco: Never Used  . Alcohol Use: No  . Drug Use: No  . Sexually Active: Yes    Birth Control/ Protection: Post-menopausal   Other  Topics Concern  . Not on file   Social History Narrative  . No narrative on file    Family History  Problem Relation Age of Onset  . Diabetes Mother   . Heart Problems Mother     Past Medical History  Diagnosis Date  . Arthritis   . Mental disorder   . Stones in the urinary tract   . GERD (gastroesophageal reflux disease)   . Anxiety   . Depression   . Chronic pain     knees, hips  . Hemorrhoids     mild  . OA (osteoarthritis)     hips, knees  . Ankle swelling   . Diabetes mellitus without complication     prediabetic  . High cholesterol   . Difficulty concentrating   . Depression with anxiety 10/08/2012     Treated since 1995 with Prozac, disability.     Past Surgical History  Procedure Laterality Date  . Knee surgery Bilateral 01:left,1985:right    torn meniscus lft  . Knee surgery  1985    lateral release  . Hand surgery      rt carpal tunnel 90  . Cholecystectomy  12/30/2011    Procedure: LAPAROSCOPIC CHOLECYSTECTOMY;  Surgeon: Emelia Loron, MD;  Location: [facility];  Service: General;  Laterality: N/A;  . Nasal septum surgery    . Total hip arthroplasty Right  07/07/2012    Procedure: TOTAL HIP ARTHROPLASTY;  Surgeon: Loanne Drilling, MD;  Location: [facility] ORS;  Service: Orthopedics;  Laterality: Right;  . Total hip arthroplasty  2014    Current Outpatient Prescriptions  Medication Sig Dispense Refill  . ALPRAZolam (XANAX) 0.5 MG tablet Take 0.5 mg by mouth at bedtime as needed. Anxiety/sleep      . buPROPion (WELLBUTRIN XL) 150 MG 24 hr tablet Take 150 mg by mouth daily before breakfast.      . FLUoxetine (PROZAC) 20 MG capsule Take 20 mg by mouth daily before breakfast.       . methylphenidate (RITALIN) 10 MG tablet Take 10 mg by mouth 2 (two) times daily. 1/2 tablet daily       No current facility-administered medications for this visit.    Allergies as of 10/08/2012 - Review Complete 10/08/2012  Allergen Reaction Noted  . Penicillins  11/01/2011     Vitals: BP 112/72  Pulse 76  Ht 5\' 5"  (1.651 m)  Wt 215 lb (97.523 kg)  BMI 35.78 kg/m2 Last Weight:  Wt Readings from Last 1 Encounters:  10/08/12 215 lb (97.523 kg)   Last Height:   Ht Readings from Last 1 Encounters:  10/08/12 5\' 5"  (1.651 m)   VPhysical exam:  General: The patient is awake, alert and appears not in acute distress. The patient is well groomed. Head: Normocephalic, atraumatic. Neck is supple. Mallampati 3 , neck circumference:16.8 and nasal congestion, she can not breath unrestrcited through the nose.  Cardiovascular:  Regular rate and rhythm without  murmurs or carotid bruit, and without distended neck veins. Respiratory: Lungs are clear to auscultation. Skin:  Without evidence of edema, or rash Trunk: BMI is highly  elevated , patient  has normal posture.  Neurologic exam : The patient is awake and alert, oriented to place and time.  Memory subjective described as intact. There is a normal attention span & concentration ability. Speech is fluent without dysarthria, dysphonia or aphasia. Mood and affect are appropriate.  Cranial nerves: Pupils are equal and briskly reactive to light. Funduscopic exam without evidence of pallor or edema. Extraocular movements  in vertical and horizontal planes intact and without nystagmus. Visual fields by finger perimetry are intact. Hearing to finger rub intact.  Facial sensation intact to fine touch. Facial motor strength is symmetric and tongue and uvula move midline.  Motor exam:   Normal tone and normal muscle bulk  in all extremities.  Sensory:  Fine touch, pinprick and vibration were tested in all extremities. Proprioception is tested in the upper extremities only. This was  normal.  Coordination: Rapid alternating movements in the fingers/hands is tested and normal.without evidence of ataxia, dysmetria or tremor.  Gait and station: Patient walks without assistive device and is able and assisted stool climb up to  the exam table. Strength within normal limits. She has limited exercise tolerance due to bilateral severe osteo arthritis of hip and knees, pain.   Deep tendon reflexes: in the  upper and lower extremities are symmetric and intact.  Assessment:  After physical and neurologic examination, review of laboratory studies, imaging, neurophysiology testing and pre-existing records, assessment will be reviewed on the problem list.  Plan:  Treatment plan and additional workup will be reviewed under Problem List.  The chief sleep related complaint is chronic insomnia. The patient has tried a variety of sleep aids, as well as medications that were used off label to induce sleep. The patient tried to over many  years to sleep with a medication but was not successful. Her depression may have played a role in developing insomnia and the first place. She had for years benefit with using Seroquel, but gained excessive weight on this medication.   Now being  morbidly obese , with  her limited exercise tolerance,a high-grade Mallampati and the report of loud snoring and frequent awakenings,  it is possible that apnea has been  developed.  After her hip surgery she was on Robaxin and oxycodone, but felt that not even these medications allowed her to fall asleep. She has also used Xanax at times without success.  Hypersomnia is not present in the clinical, organic meaning. The patient questions,  if she could even fall asleep in an in lab study, I would share this concern ,   agree with her and  referred her for a home sleep test as an apnea screening tool.  If apnea is found , I will follow up - If it is not part of her sleep problem, I recommend to treat the chronic insomnia in the setting of a psychology-psychiatry setting with medication and biofeedback.

## 2012-10-08 NOTE — Patient Instructions (Signed)

## 2012-10-08 NOTE — Telephone Encounter (Signed)
Dr. Vickey Huger is referring the patient for hst.  Pt has problems falling asleep.  [employer] Medicare does on require prior approval.  Coverage is 80%, pt has 20% coinsurance.  Estimated oop is $81, pt will bring $40 as payment and will be billed for the remaining balance...kl

## 2012-10-11 NOTE — Telephone Encounter (Signed)
LM for pt at home phone to schedule HST set up, asked her to call us back, told her of availability tomorrow during the morning or afternoon before 4 pm. -sh

## 2012-10-20 NOTE — Telephone Encounter (Signed)
LM for pt at home phone to schedule HST set up, asked her to call me back to arrange appointment at her convenience.  Explained it would only take about 15 minutes. -sh

## 2012-10-21 ENCOUNTER — Other Ambulatory Visit: Payer: Self-pay | Admitting: Internal Medicine

## 2012-10-21 DIAGNOSIS — Z1231 Encounter for screening mammogram for malignant neoplasm of breast: Secondary | ICD-10-CM

## 2012-11-04 ENCOUNTER — Encounter: Payer: Self-pay | Admitting: Neurology

## 2012-11-05 ENCOUNTER — Ambulatory Visit: Payer: Medicare Other | Admitting: Neurology

## 2012-11-16 ENCOUNTER — Ambulatory Visit (INDEPENDENT_AMBULATORY_CARE_PROVIDER_SITE_OTHER): Payer: Medicare Other | Admitting: Family Medicine

## 2012-11-16 ENCOUNTER — Ambulatory Visit
Admission: RE | Admit: 2012-11-16 | Discharge: 2012-11-16 | Disposition: A | Discharge status: Home / Self Care | Payer: Medicare Other | Source: Ambulatory Visit | Attending: Internal Medicine | Admitting: Internal Medicine

## 2012-11-16 VITALS — BP 129/82 | HR 86 | Temp 98.2°F | Resp 16 | Ht 65.8 in | Wt 221.0 lb

## 2012-11-16 DIAGNOSIS — Z1231 Encounter for screening mammogram for malignant neoplasm of breast: Secondary | ICD-10-CM

## 2012-11-16 DIAGNOSIS — K089 Disorder of teeth and supporting structures, unspecified: Secondary | ICD-10-CM

## 2012-11-16 DIAGNOSIS — K0889 Other specified disorders of teeth and supporting structures: Secondary | ICD-10-CM

## 2012-11-16 MED ORDER — KETOROLAC TROMETHAMINE 60 MG/2ML IM SOLN
60.0000 mg | Freq: Once | INTRAMUSCULAR | Status: AC
  Administered 2012-11-16: 60 mg via INTRAMUSCULAR

## 2012-11-16 NOTE — Progress Notes (Signed)
62 year old female coming in with the dental pain for today duration. Patient states she's had this pain before and has had multiple dental procedures done. Patient states that she had taken oxycodone earlier and it did seem to resolve some of the pain. Patient went for her mammogram today and she was to and on candy and had a significant increase in pain. Patient describes the pain as a sharp severe pain that hurts with inspiration. Patient denies any fever or chills or any abnormal weight loss. Patient did call her dentist and has an appointment with him at 8am tomorrow.  Past medical history, social, surgical and family history all reviewed.   Physical Exam Blood pressure 129/82, pulse 86, temperature 98.2 F (36.8 C), temperature source Oral, resp. rate 16, height 5' 5.8" (1.671 m), weight 221 lb (100.245 kg), SpO2 96.00%. Gen: NAD, alert Mouth exam: On inspection patient's left inferior molar on the inner portion does have a crack in the area patient's filling. There is no signs of infection at this time and no discharge. Patient is very severely tender to palpation. Gum show no significant gingivitis. Patient has had numerous other dental procedures done. Tongue has full range of motion.  Assessment: Fracture in dental implant  Plan Toradol today 60 IM Patient already has oxycodone and told her that she can take this every 6 hours until followup with dentist tomorrow morning at 8 AM. Icing 20 minutes every 2 hours could be beneficial. Discussed signs of infection and went to seek medical attention.

## 2012-11-16 NOTE — Patient Instructions (Signed)
Very nice to meet you I am sorry for your tooth I am giving you an injection to help with the pain. Do not take any ibuprofen until tomorrow.  Take the oxycodone that you have if you need it.  Good luck tomorrow with the dentist

## 2012-11-24 ENCOUNTER — Other Ambulatory Visit: Payer: Self-pay | Admitting: Internal Medicine

## 2012-11-24 DIAGNOSIS — R928 Other abnormal and inconclusive findings on diagnostic imaging of breast: Secondary | ICD-10-CM

## 2012-12-07 ENCOUNTER — Ambulatory Visit
Admission: RE | Admit: 2012-12-07 | Discharge: 2012-12-07 | Disposition: A | Discharge status: Home / Self Care | Payer: Medicare Other | Source: Ambulatory Visit | Attending: Internal Medicine | Admitting: Internal Medicine

## 2012-12-07 DIAGNOSIS — R928 Other abnormal and inconclusive findings on diagnostic imaging of breast: Secondary | ICD-10-CM

## 2013-02-11 DIAGNOSIS — E7800 Pure hypercholesterolemia, unspecified: Secondary | ICD-10-CM | POA: Insufficient documentation

## 2014-03-11 IMAGING — CR DG PORTABLE PELVIS
1 series · 1 of 1 positions shown · non-contrast
Comparison: 07/01/2012

CLINICAL DATA: Osteoarthritis

PORTABLE PELVIS,PORTABLE RIGHT HIP - 1 VIEW

[AP]
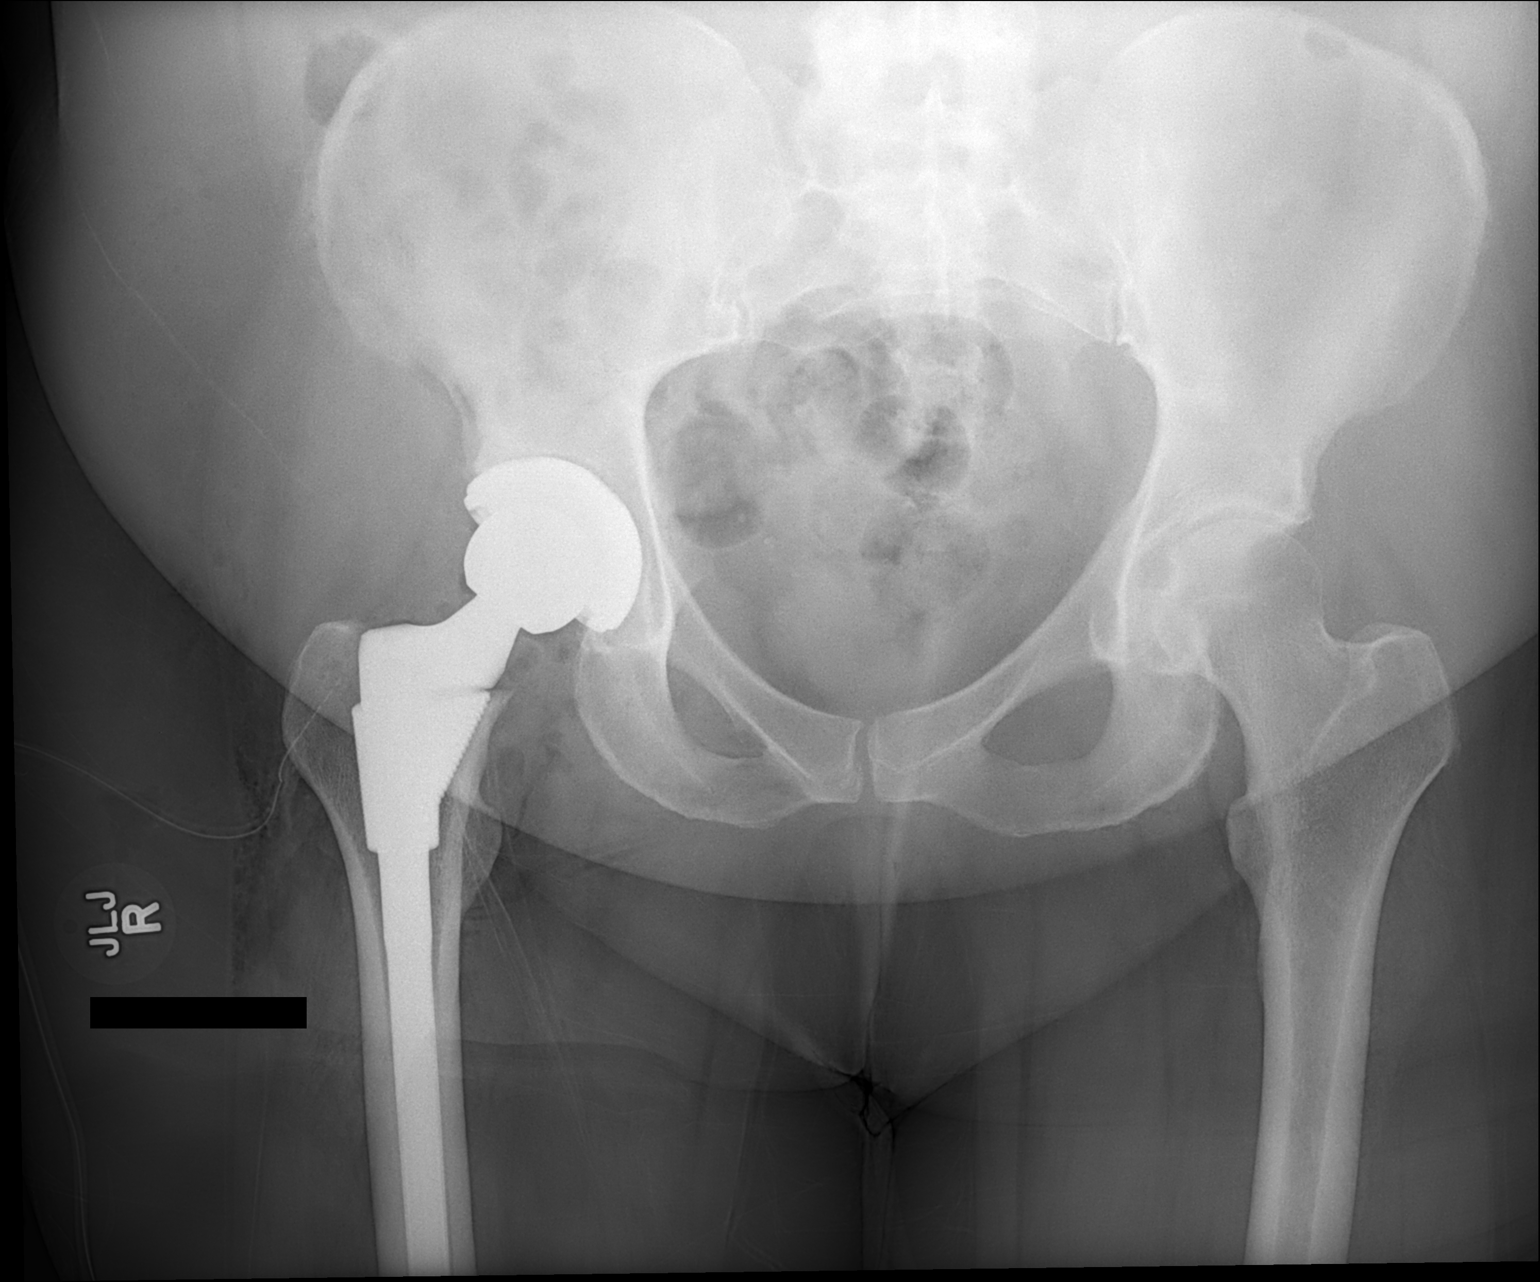

[1 of 1 positions shown; findings below may reference images not displayed]

FINDINGS: Portable pelvis and hip films demonstrate satisfactory
appearance status post right total hip arthroplasty.  Surgical
drain good position.
IMPRESSION: Satisfactory position and alignment status post right THA.

## 2014-05-01 DIAGNOSIS — M545 Low back pain, unspecified: Secondary | ICD-10-CM | POA: Insufficient documentation

## 2014-08-21 ENCOUNTER — Other Ambulatory Visit: Payer: Self-pay | Admitting: Internal Medicine

## 2014-08-21 DIAGNOSIS — N6320 Unspecified lump in the left breast, unspecified quadrant: Secondary | ICD-10-CM

## 2014-08-30 ENCOUNTER — Ambulatory Visit
Admission: RE | Admit: 2014-08-30 | Discharge: 2014-08-30 | Disposition: A | Discharge status: Home / Self Care | Payer: Medicare Other | Source: Ambulatory Visit | Attending: Internal Medicine | Admitting: Internal Medicine

## 2014-08-30 DIAGNOSIS — N6320 Unspecified lump in the left breast, unspecified quadrant: Secondary | ICD-10-CM

## 2015-08-31 DIAGNOSIS — I129 Hypertensive chronic kidney disease with stage 1 through stage 4 chronic kidney disease, or unspecified chronic kidney disease: Secondary | ICD-10-CM | POA: Insufficient documentation

## 2015-08-31 DIAGNOSIS — N1831 Chronic kidney disease, stage 3a: Secondary | ICD-10-CM | POA: Insufficient documentation

## 2016-06-16 ENCOUNTER — Encounter (HOSPITAL_BASED_OUTPATIENT_CLINIC_OR_DEPARTMENT_OTHER): Payer: Self-pay

## 2016-06-16 ENCOUNTER — Emergency Department (HOSPITAL_BASED_OUTPATIENT_CLINIC_OR_DEPARTMENT_OTHER): Payer: Medicare Other

## 2016-06-16 ENCOUNTER — Emergency Department (HOSPITAL_BASED_OUTPATIENT_CLINIC_OR_DEPARTMENT_OTHER)
Admission: EM | Admit: 2016-06-16 | Discharge: 2016-06-16 | Disposition: A | Discharge status: Home / Self Care | Payer: Medicare Other | Attending: Emergency Medicine | Admitting: Emergency Medicine

## 2016-06-16 DIAGNOSIS — E119 Type 2 diabetes mellitus without complications: Secondary | ICD-10-CM | POA: Diagnosis not present

## 2016-06-16 DIAGNOSIS — J069 Acute upper respiratory infection, unspecified: Secondary | ICD-10-CM | POA: Diagnosis not present

## 2016-06-16 DIAGNOSIS — R062 Wheezing: Secondary | ICD-10-CM

## 2016-06-16 DIAGNOSIS — B9789 Other viral agents as the cause of diseases classified elsewhere: Secondary | ICD-10-CM

## 2016-06-16 DIAGNOSIS — R05 Cough: Secondary | ICD-10-CM | POA: Diagnosis present

## 2016-06-16 LAB — CBC WITH DIFFERENTIAL/PLATELET
Basophils Absolute: 0 10*3/uL (ref 0.0–0.1)
Basophils Relative: 0 %
EOS ABS: 0.6 10*3/uL (ref 0.0–0.7)
EOS PCT: 7 %
HCT: 41.9 % (ref 36.0–46.0)
HEMOGLOBIN: 13.4 g/dL (ref 12.0–15.0)
LYMPHS ABS: 3.9 10*3/uL (ref 0.7–4.0)
Lymphocytes Relative: 48 %
MCH: 29 pg (ref 26.0–34.0)
MCHC: 32 g/dL (ref 30.0–36.0)
MCV: 90.7 fL (ref 78.0–100.0)
MONO ABS: 0.7 10*3/uL (ref 0.1–1.0)
MONOS PCT: 9 %
Neutro Abs: 2.8 10*3/uL (ref 1.7–7.7)
Neutrophils Relative %: 36 %
PLATELETS: 225 10*3/uL (ref 150–400)
RBC: 4.62 MIL/uL (ref 3.87–5.11)
RDW: 13.6 % (ref 11.5–15.5)
WBC: 7.9 10*3/uL (ref 4.0–10.5)

## 2016-06-16 LAB — BASIC METABOLIC PANEL
Anion gap: 10 (ref 5–15)
BUN: 8 mg/dL (ref 6–20)
CHLORIDE: 102 mmol/L (ref 101–111)
CO2: 30 mmol/L (ref 22–32)
CREATININE: 0.89 mg/dL (ref 0.44–1.00)
Calcium: 8.8 mg/dL — ABNORMAL LOW (ref 8.9–10.3)
GFR calc Af Amer: >60 mL/min (ref >60)
GFR calc non Af Amer: >60 mL/min (ref >60)
Glucose, Bld: 143 mg/dL — ABNORMAL HIGH (ref 65–99)
Potassium: 3.6 mmol/L (ref 3.5–5.1)
SODIUM: 142 mmol/L (ref 135–145)

## 2016-06-16 MED ORDER — IPRATROPIUM-ALBUTEROL 0.5-2.5 (3) MG/3ML IN SOLN
3.0000 mL | Freq: Once | RESPIRATORY_TRACT | Status: AC
  Administered 2016-06-16: 3 mL via RESPIRATORY_TRACT
  Filled 2016-06-16: qty 3

## 2016-06-16 MED ORDER — ALBUTEROL SULFATE HFA 108 (90 BASE) MCG/ACT IN AERS
2.0000 | INHALATION_SPRAY | RESPIRATORY_TRACT | Status: DC | PRN
  Administered 2016-06-16: 2 via RESPIRATORY_TRACT
  Filled 2016-06-16: qty 6.7

## 2016-06-16 MED ORDER — PREDNISONE 20 MG PO TABS: 40.0000 mg | ORAL_TABLET | Freq: Every day | ORAL | 0 refills | Status: DC

## 2016-06-16 MED ORDER — IPRATROPIUM BROMIDE 0.02 % IN SOLN
0.5000 mg | Freq: Once | RESPIRATORY_TRACT | Status: AC
  Administered 2016-06-16: 0.5 mg via RESPIRATORY_TRACT
  Filled 2016-06-16: qty 2.5

## 2016-06-16 MED ORDER — ALBUTEROL SULFATE (2.5 MG/3ML) 0.083% IN NEBU
5.0000 mg | INHALATION_SOLUTION | Freq: Once | RESPIRATORY_TRACT | Status: AC
  Administered 2016-06-16: 5 mg via RESPIRATORY_TRACT
  Filled 2016-06-16: qty 6

## 2016-06-16 MED ORDER — METHYLPREDNISOLONE SODIUM SUCC 125 MG IJ SOLR
125.0000 mg | Freq: Once | INTRAMUSCULAR | Status: AC
  Administered 2016-06-16: 125 mg via INTRAVENOUS
  Filled 2016-06-16: qty 2

## 2016-06-16 MED ORDER — MAGNESIUM SULFATE 2 GM/50ML IV SOLN
2.0000 g | Freq: Once | INTRAVENOUS | Status: AC
  Administered 2016-06-16: 2 g via INTRAVENOUS
  Filled 2016-06-16: qty 50

## 2016-06-16 MED ORDER — ALBUTEROL SULFATE (2.5 MG/3ML) 0.083% IN NEBU
INHALATION_SOLUTION | RESPIRATORY_TRACT | Status: AC
  Filled 2016-06-16: qty 3

## 2016-06-16 NOTE — ED Provider Notes (Signed)
[facility] Provider Note   CSN: [id number] Arrival date & time: 06/16/16  1653  By signing my name below, I, Vista Mink, attest that this documentation has been prepared under the direction and in the presence of Gwyneth Sprout, MD. Electronically signed, Vista Mink, ED Scribe. 06/16/16. 6:20 PM.  History   Chief Complaint Chief Complaint  Patient presents with  . Cough    HPI HPI Comments: Olivia Walker is a 66 y.o. female, with Hx of DM, who presents to the Emergency Department complaining of cough, congestion that started five days ago with associated worsening wheezing that started yesterday. Pt's symptoms started as a severe cough described as a chest cold. She did have a fever six days ago that lasted four days and has resolved. Her temperature on arrival is 99.1. Pt contacted her PCP yesterday who instructed her to let this illness run it's course. She has taken Mucinex with no significant relief. Pt has had difficulty sleeping due to her wheezing. Pt has had Bronchitis in the past but states that it has been a long time. She does not a mild increase in her work of breathing, specifically during exertion but none currently. On arrival she received one breathing treatment and reports mild relief with this treatment, she is currently doing second treatment. Pt is not a current smoker and has never been a smoker. No hx of heart problems.  The history is provided by the patient. No language interpreter was used.    Past Medical History:  Diagnosis Date  . Ankle swelling   . Anxiety   . Arthritis   . Chronic pain    knees, hips  . Depression   . Depression with anxiety 10/08/2012    Treated since 1995 with Prozac, disability.   . Diabetes mellitus without complication (HCC)    prediabetic  . Difficulty concentrating   . GERD (gastroesophageal reflux disease)   . Hemorrhoids    mild  . High cholesterol   . Impaired glucose tolerance   . Kidney stone   .  Mental disorder   . OA (osteoarthritis)    hips, knees  . Obesity   . Stones in the urinary tract     Patient Active Problem List   Diagnosis Date Noted  . Insomnia, persistent 10/08/2012  . Depression with anxiety 10/08/2012  . OA (osteoarthritis) of hip 07/07/2012    Past Surgical History:  Procedure Laterality Date  . CHOLECYSTECTOMY  12/30/2011   Procedure: LAPAROSCOPIC CHOLECYSTECTOMY;  Surgeon: Emelia Loron, MD;  Location: [facility] OR;  Service: General;  Laterality: N/A;  . HAND SURGERY     rt carpal tunnel 90  . kidney stones    . KNEE SURGERY Bilateral 01:left,1985:right   torn meniscus lft  . KNEE SURGERY  1985   lateral release  . NASAL SEPTUM SURGERY    . TOTAL HIP ARTHROPLASTY Right 07/07/2012   Procedure: TOTAL HIP ARTHROPLASTY;  Surgeon: Loanne Drilling, MD;  Location: [facility] ORS;  Service: Orthopedics;  Laterality: Right;  . TOTAL HIP ARTHROPLASTY  2014    OB History    No data available       Home Medications    Prior to Admission medications   Medication Sig Start Date End Date Taking? Authorizing Provider  ATORVASTATIN CALCIUM PO Take by mouth.   Yes Historical Provider, MD  METFORMIN HCL PO Take by mouth.   Yes Historical Provider, MD  ALPRAZolam Prudy Feeler) 0.5 MG tablet Take 0.5 mg by mouth  at bedtime as needed. Anxiety/sleep    Historical Provider, MD  ciprofloxacin (CIPRO) 500 MG tablet Take 500 mg by mouth 2 (two) times daily.    Historical Provider, MD  FLUoxetine (PROZAC) 20 MG capsule Take 20 mg by mouth daily before breakfast.     Historical Provider, MD  FLUoxetine (PROZAC) 40 MG capsule Take 40 mg by mouth daily.    Historical Provider, MD  methylphenidate (RITALIN) 10 MG tablet Take 10 mg by mouth 2 (two) times daily as needed. 1/2 tablet daily    Historical Provider, MD    Family History Family History  Problem Relation Age of Onset  . Diabetes Mother   . Heart Problems Mother     Social History Social History  Substance Use Topics  .  Smoking status: Never Smoker  . Smokeless tobacco: Never Used  . Alcohol use No    Allergies   Penicillins   Review of Systems Review of Systems  Constitutional: Positive for fever.  Respiratory: Positive for cough and wheezing. Negative for shortness of breath.   All other systems reviewed and are negative.    Physical Exam Updated Vital Signs BP 161/90 (BP Location: Left Arm)   Pulse 73   Temp 99.1 F (37.3 C) (Oral)   Resp 20   SpO2 99%   Physical Exam  Constitutional: She is oriented to person, place, and time. She appears well-developed and well-nourished.  HENT:  Head: Normocephalic and atraumatic.  Eyes: Conjunctivae are normal.  Neck: Neck supple.  Cardiovascular: Normal rate and regular rhythm.   Pulmonary/Chest: Effort normal. No respiratory distress. She has wheezes.  Expiratory wheezing pronounced in all lung fields.  Abdominal: Soft. Bowel sounds are normal.  Musculoskeletal: Normal range of motion. She exhibits no edema.  No peripheral edema.  Neurological: She is alert and oriented to person, place, and time.  Skin: Skin is warm and dry.  Psychiatric: She has a normal mood and affect. Her behavior is normal.  Nursing note and vitals reviewed.   ED Treatments / Results  DIAGNOSTIC STUDIES: Oxygen Saturation is 99% on RA, normal by my interpretation.  COORDINATION OF CARE: 5:53 PM-Discussed treatment plan with pt at bedside and pt agreed to plan.   Labs (all labs ordered are listed, but only abnormal results are displayed) Labs Reviewed  BASIC METABOLIC PANEL - Abnormal; Notable for the following:       Result Value   Glucose, Bld 143 (*)    Calcium 8.8 (*)    All other components within normal limits  CBC WITH DIFFERENTIAL/PLATELET    EKG  EKG Interpretation None       Radiology Dg Chest 2 View  Result Date: 06/16/2016 CLINICAL DATA:  Flu symptoms for the past week ; cough and wheezing. EXAM: CHEST  2 VIEW COMPARISON:  None in PACs  FINDINGS: The lungs are adequately inflated. There is no focal infiltrate. There is no pleural effusion. The heart and pulmonary vascularity are normal. The mediastinum is normal in width. There is calcification in the wall of the aortic arch. The bony thorax exhibits no acute abnormality. IMPRESSION: There is no pneumonia nor other acute cardiopulmonary abnormality. Thoracic aortic atherosclerosis. Electronically Signed   By: David  SwazilandJordan M.D.   On: 06/16/2016 17:21    Procedures Procedures (including critical care time)  Medications Ordered in ED Medications  albuterol (PROVENTIL HFA;VENTOLIN HFA) 108 (90 Base) MCG/ACT inhaler 2 puff (2 puffs Inhalation Given 06/16/16 2051)  ipratropium-albuterol (DUONEB) 0.5-2.5 (3) MG/3ML nebulizer  solution 3 mL (3 mLs Nebulization Given 06/16/16 1727)  albuterol (PROVENTIL) (2.5 MG/3ML) 0.083% nebulizer solution 5 mg (5 mg Nebulization Given 06/16/16 1743)  methylPREDNISolone sodium succinate (SOLU-MEDROL) 125 mg/2 mL injection 125 mg (125 mg Intravenous Given 06/16/16 1758)  magnesium sulfate IVPB 2 g 50 mL (0 g Intravenous Stopped 06/16/16 1815)  albuterol (PROVENTIL) (2.5 MG/3ML) 0.083% nebulizer solution 5 mg (5 mg Nebulization Given 06/16/16 1942)  ipratropium (ATROVENT) nebulizer solution 0.5 mg (0.5 mg Nebulization Given 06/16/16 1942)     Initial Impression / Assessment and Plan / ED Course  I have reviewed the triage vital signs and the nursing notes.  Pertinent labs & imaging results that were available during my care of the patient were reviewed by me and considered in my medical decision making (see chart for details).    Patient is a 66 year old female who had symptoms consistent with the flu late last week and started wheezing yesterday. Upon arrival here she had audible wheezing but no significant shortness of breath. Oxygen saturation 97%. Patient has no chest pain and no evidence of fluid overload. Low suspicion for CHF think this is related  to recent URI symptoms. She has no history of COPD or asthma. She does not smoke. Some improvement with first albuterol and Atrovent however still audibly wheezing. Will give a second treatment as well as steroids and magnesium. Patient's chest x-ray within normal limits. CBC and BMP within normal limits. After treatment will recheck  9:27 PM After 3 no, steroids and magnesium patient's wheezing is improved but not gone. Patient was able to ambulate in the department without shortness of breath. She is speaking in complete sentences and oxygen saturation 97% on room air. Patient was sent home with an inhaler and prednisone. She has follow-up with her doctor tomorrow morning at 10:00 and was strongly encouraged to go to that appointment.  Final Clinical Impressions(s) / ED Diagnoses   Final diagnoses:  Viral URI with cough  Wheezing    New Prescriptions New Prescriptions   PREDNISONE (DELTASONE) 20 MG TABLET    Take 2 tablets (40 mg total) by mouth daily.   I personally performed the services described in this documentation, which was scribed in my presence.  The recorded information has been reviewed and considered.    Gwyneth Sprout, MD 06/16/16 2129

## 2016-06-16 NOTE — ED Notes (Signed)
Pt verbalizes understanding of d/c instructions and denies any further needs at this time. 

## 2016-06-16 NOTE — ED Triage Notes (Signed)
C/o cough, fever x 5 days-NAD-steady gait-congested cough with wheezing noted after

## 2016-06-16 NOTE — ED Notes (Signed)
Pt ambulated around dept on room air. SATS steady at 95% throughout walk HR 112. Pt had no work of breathing or distress noted

## 2016-06-16 NOTE — ED Notes (Signed)
ED Provider at bedside. 

## 2016-11-05 ENCOUNTER — Other Ambulatory Visit: Payer: Self-pay | Admitting: Internal Medicine

## 2016-11-05 DIAGNOSIS — Z1231 Encounter for screening mammogram for malignant neoplasm of breast: Secondary | ICD-10-CM

## 2016-11-07 ENCOUNTER — Ambulatory Visit
Admission: RE | Admit: 2016-11-07 | Discharge: 2016-11-07 | Disposition: A | Discharge status: Home / Self Care | Payer: Medicare Other | Source: Ambulatory Visit | Attending: Internal Medicine | Admitting: Internal Medicine

## 2016-11-07 DIAGNOSIS — Z1231 Encounter for screening mammogram for malignant neoplasm of breast: Secondary | ICD-10-CM

## 2016-11-14 ENCOUNTER — Ambulatory Visit: Payer: Medicare Other

## 2016-12-18 DIAGNOSIS — Z96649 Presence of unspecified artificial hip joint: Secondary | ICD-10-CM | POA: Insufficient documentation

## 2017-01-05 ENCOUNTER — Ambulatory Visit: Payer: Self-pay | Admitting: Orthopedic Surgery

## 2017-02-09 ENCOUNTER — Inpatient Hospital Stay: Admit: 2017-02-09 | Payer: Medicare Other | Admitting: Orthopedic Surgery

## 2017-02-09 SURGERY — ARTHROPLASTY, KNEE, TOTAL
Anesthesia: Choice | Site: Knee | Laterality: Right

## 2017-09-04 DIAGNOSIS — M51369 Other intervertebral disc degeneration, lumbar region without mention of lumbar back pain or lower extremity pain: Secondary | ICD-10-CM | POA: Insufficient documentation

## 2017-12-29 ENCOUNTER — Other Ambulatory Visit: Payer: Self-pay | Admitting: Internal Medicine

## 2017-12-29 DIAGNOSIS — Z1231 Encounter for screening mammogram for malignant neoplasm of breast: Secondary | ICD-10-CM

## 2018-01-05 ENCOUNTER — Emergency Department (HOSPITAL_BASED_OUTPATIENT_CLINIC_OR_DEPARTMENT_OTHER)
Admission: EM | Admit: 2018-01-05 | Discharge: 2018-01-05 | Disposition: A | Discharge status: Home / Self Care | Payer: Medicare Other | Attending: Emergency Medicine | Admitting: Emergency Medicine

## 2018-01-05 ENCOUNTER — Encounter (HOSPITAL_BASED_OUTPATIENT_CLINIC_OR_DEPARTMENT_OTHER): Payer: Self-pay | Admitting: *Deleted

## 2018-01-05 ENCOUNTER — Other Ambulatory Visit: Payer: Self-pay

## 2018-01-05 DIAGNOSIS — E119 Type 2 diabetes mellitus without complications: Secondary | ICD-10-CM | POA: Insufficient documentation

## 2018-01-05 DIAGNOSIS — M545 Low back pain, unspecified: Secondary | ICD-10-CM

## 2018-01-05 DIAGNOSIS — Z7984 Long term (current) use of oral hypoglycemic drugs: Secondary | ICD-10-CM | POA: Diagnosis not present

## 2018-01-05 DIAGNOSIS — Z79899 Other long term (current) drug therapy: Secondary | ICD-10-CM | POA: Diagnosis not present

## 2018-01-05 DIAGNOSIS — M5489 Other dorsalgia: Secondary | ICD-10-CM | POA: Diagnosis present

## 2018-01-05 DIAGNOSIS — N390 Urinary tract infection, site not specified: Secondary | ICD-10-CM | POA: Diagnosis not present

## 2018-01-05 LAB — URINALYSIS, ROUTINE W REFLEX MICROSCOPIC
Glucose, UA: NEGATIVE mg/dL
Ketones, ur: 15 mg/dL — AB
NITRITE: NEGATIVE
PROTEIN: 30 mg/dL — AB
Specific Gravity, Urine: >1.03 — ABNORMAL HIGH (ref 1.005–1.030)
pH: 5.5 (ref 5.0–8.0)

## 2018-01-05 LAB — URINALYSIS, MICROSCOPIC (REFLEX): WBC, UA: >50 WBC/hpf (ref 0–5)

## 2018-01-05 MED ORDER — CIPROFLOXACIN HCL 500 MG PO TABS
500.0000 mg | ORAL_TABLET | Freq: Once | ORAL | Status: AC
  Administered 2018-01-05: 500 mg via ORAL
  Filled 2018-01-05: qty 1

## 2018-01-05 MED ORDER — CIPROFLOXACIN HCL 500 MG PO TABS: 500.0000 mg | ORAL_TABLET | Freq: Two times a day (BID) | ORAL | 0 refills | Status: DC

## 2018-01-05 NOTE — ED Triage Notes (Signed)
Back pain x 2 days. She thinks she has a UTI.

## 2018-01-05 NOTE — ED Notes (Signed)
ED Provider at bedside. 

## 2018-01-05 NOTE — ED Provider Notes (Signed)
[facility] EMERGENCY DEPARTMENT Provider Note   CSN: [id number] Arrival date & time: 01/05/18  2111     History   Chief Complaint Chief Complaint  Patient presents with  . Back Pain    HPI Olivia PhenixLisa M Walker is a 67 y.o. female.  He is here with complaint of 3 days of diffuse back pain.  Its constant in nature worse with any moving or twisting.  She says this is a typical symptom when she gets a UTI and she gets them frequently.  She never usually gets any kind of urinary symptoms and has noticed no hematuria.  She is had a little bit of nausea but no vomiting.  She denies any fevers or chills.  She states she usually takes Cipro from her doctor when she gets the symptoms.  The history is provided by the patient.  Back Pain   This is a recurrent problem. The current episode started more than 2 days ago. The problem occurs constantly. The problem has not changed since onset.The pain is associated with no known injury. Pain location: diffuse low back. The quality of the pain is described as aching. The pain does not radiate. The pain is moderate. The symptoms are aggravated by bending, twisting and certain positions. Pertinent negatives include no chest pain, no fever, no abdominal pain, no dysuria, no pelvic pain, no paresthesias and no tingling. She has tried nothing for the symptoms. The treatment provided no relief.    Past Medical History:  Diagnosis Date  . Ankle swelling   . Anxiety   . Arthritis   . Chronic pain    knees, hips  . Depression   . Depression with anxiety 10/08/2012    Treated since 1995 with Prozac, disability.   . Diabetes mellitus without complication (HCC)    prediabetic  . Difficulty concentrating   . GERD (gastroesophageal reflux disease)   . Hemorrhoids    mild  . High cholesterol   . Impaired glucose tolerance   . Kidney stone   . Mental disorder   . OA (osteoarthritis)    hips, knees  . Obesity   . Stones in the urinary tract      Patient Active Problem List   Diagnosis Date Noted  . Insomnia, persistent 10/08/2012  . Depression with anxiety 10/08/2012  . OA (osteoarthritis) of hip 07/07/2012    Past Surgical History:  Procedure Laterality Date  . CHOLECYSTECTOMY  12/30/2011   Procedure: LAPAROSCOPIC CHOLECYSTECTOMY;  Surgeon: Emelia LoronMatthew Wakefield, MD;  [facility] OR;  Service: General;  Laterality: N/A;  . HAND SURGERY     rt carpal tunnel 90  . kidney stones    . KNEE SURGERY Bilateral 01:left,1985:right   torn meniscus lft  . KNEE SURGERY  1985   lateral release  . NASAL SEPTUM SURGERY    . TOTAL HIP ARTHROPLASTY Right 07/07/2012   Procedure: TOTAL HIP ARTHROPLASTY;  Surgeon: Loanne DrillingFrank V Aluisio, MD;  Location: [facility] OR[facility];  Service: Orthopedics;  Laterality: Right;  . TOTAL HIP ARTHROPLASTY  2014     OB History   None      Home Medications    Prior to Admission medications   Medication Sig Start Date End Date Taking? Authorizing Provider  ALPRAZolam Prudy Feeler(XANAX) 0.5 MG tablet Take 0.5 mg by mouth at bedtime as needed. Anxiety/sleep   Yes [provider]  ATORVASTATIN CALCIUM PO Take by mouth.   Yes [provider]  FLUoxetine (PROZAC) 40 MG capsule Take 40 mg  by mouth daily.   Yes [provider]  Meloxicam (MOBIC PO) Take by mouth.   Yes [provider]  METFORMIN HCL PO Take by mouth.   Yes [provider]  methylphenidate (RITALIN) 10 MG tablet Take 10 mg by mouth 2 (two) times daily as needed. 1/2 tablet daily   Yes [provider]  ciprofloxacin (CIPRO) 500 MG tablet Take 500 mg by mouth 2 (two) times daily.    [provider]  FLUoxetine (PROZAC) 20 MG capsule Take 20 mg by mouth daily before breakfast.     [provider]  predniSONE (DELTASONE) 20 MG tablet Take 2 tablets (40 mg total) by mouth daily. 06/16/16   Gwyneth Sprout, MD    Family History Family History  Problem Relation Age of Onset  . Diabetes Mother   . Heart  Problems Mother   . Breast cancer Neg Hx     Social History Social History   Tobacco Use  . Smoking status: Never Smoker  . Smokeless tobacco: Never Used  Substance Use Topics  . Alcohol use: No  . Drug use: No     Allergies   Penicillins   Review of Systems Review of Systems  Constitutional: Negative for fever.  HENT: Negative for sore throat.   Eyes: Negative for visual disturbance.  Respiratory: Negative for shortness of breath.   Cardiovascular: Negative for chest pain.  Gastrointestinal: Positive for nausea. Negative for abdominal pain and vomiting.  Genitourinary: Negative for dysuria, hematuria and pelvic pain.  Musculoskeletal: Positive for back pain. Negative for neck pain.  Skin: Negative for rash.  Neurological: Negative for tingling and paresthesias.     Physical Exam Updated Vital Signs BP (!) 160/93 (BP Location: Left Arm)   Pulse 65   Temp 98.4 F (36.9 C) (Oral)   Resp 18   Ht 5' 5.5" (1.664 m)   Wt 108 kg   SpO2 97%   BMI 39.00 kg/m   Physical Exam  Constitutional: She appears well-developed and well-nourished.  HENT:  Head: Normocephalic and atraumatic.  Eyes: Conjunctivae are normal.  Neck: Neck supple.  Cardiovascular: Normal rate, regular rhythm and normal heart sounds.  Pulmonary/Chest: Effort normal. No stridor. She has no wheezes. She has no rales.  Abdominal: Soft. She exhibits no mass. There is no tenderness. There is no guarding.  No cva tenderness  Musculoskeletal: Normal range of motion. She exhibits no tenderness.  Neurological: She is alert. GCS eye subscore is 4. GCS verbal subscore is 5. GCS motor subscore is 6.  Skin: Skin is warm and dry.  Psychiatric: She has a normal mood and affect.  Nursing note and vitals reviewed.    ED Treatments / Results  Labs (all labs ordered are listed, but only abnormal results are displayed) Labs Reviewed  URINALYSIS, ROUTINE W REFLEX MICROSCOPIC - Abnormal; Notable for the  following components:      Result Value   APPearance CLOUDY (*)    Specific Gravity, Urine >1.030 (*)    Hgb urine dipstick TRACE (*)    Bilirubin Urine SMALL (*)    Ketones, ur 15 (*)    Protein, ur 30 (*)    Leukocytes, UA MODERATE (*)    All other components within normal limits  URINALYSIS, MICROSCOPIC (REFLEX) - Abnormal; Notable for the following components:   Bacteria, UA MANY (*)    All other components within normal limits    EKG None  Radiology No results found.  Procedures Procedures (including critical  care time)  Medications Ordered in ED Medications  ciprofloxacin (CIPRO) tablet 500 mg (has no administration in time range)     Initial Impression / Assessment and Plan / ED Course  I have reviewed the triage vital signs and the nursing notes.  Pertinent labs & imaging results that were available during my care of the patient were reviewed by me and considered in my medical decision making (see chart for details).    Well appearing female with back pain similar to when gets utis. She has an infected looking urine and wants to go on cipro, that has worked for her in past. Will followup with pcp.   Final Clinical Impressions(s) / ED Diagnoses   Final diagnoses:  Lower urinary tract infectious disease  Acute bilateral low back pain without sciatica    ED Discharge Orders         Ordered    ciprofloxacin (CIPRO) 500 MG tablet  2 times daily     01/05/18 2249           Terrilee FilesButler, Tailynn Armetta C, MD 01/06/18 1150

## 2018-01-05 NOTE — Discharge Instructions (Addendum)
Your evaluated in the emergency department for low back pain that you associate with a urinary infections.  Your urinalysis did look like there was signs of infection and we are treating you with an antibiotic.  It will be important for you to follow-up with your doctor and we will call you if the antibiotic needs to be changed.

## 2018-01-07 LAB — URINE CULTURE: Culture: NO GROWTH

## 2018-02-09 ENCOUNTER — Ambulatory Visit: Payer: Medicare Other

## 2018-02-18 IMAGING — DX DG CHEST 2V
2 series · 2 of 2 positions shown · non-contrast
Comparison: None in PACs

CLINICAL DATA: Flu symptoms for the past week ; cough and wheezing.

EXAM:
CHEST  2 VIEW

[chest pa]
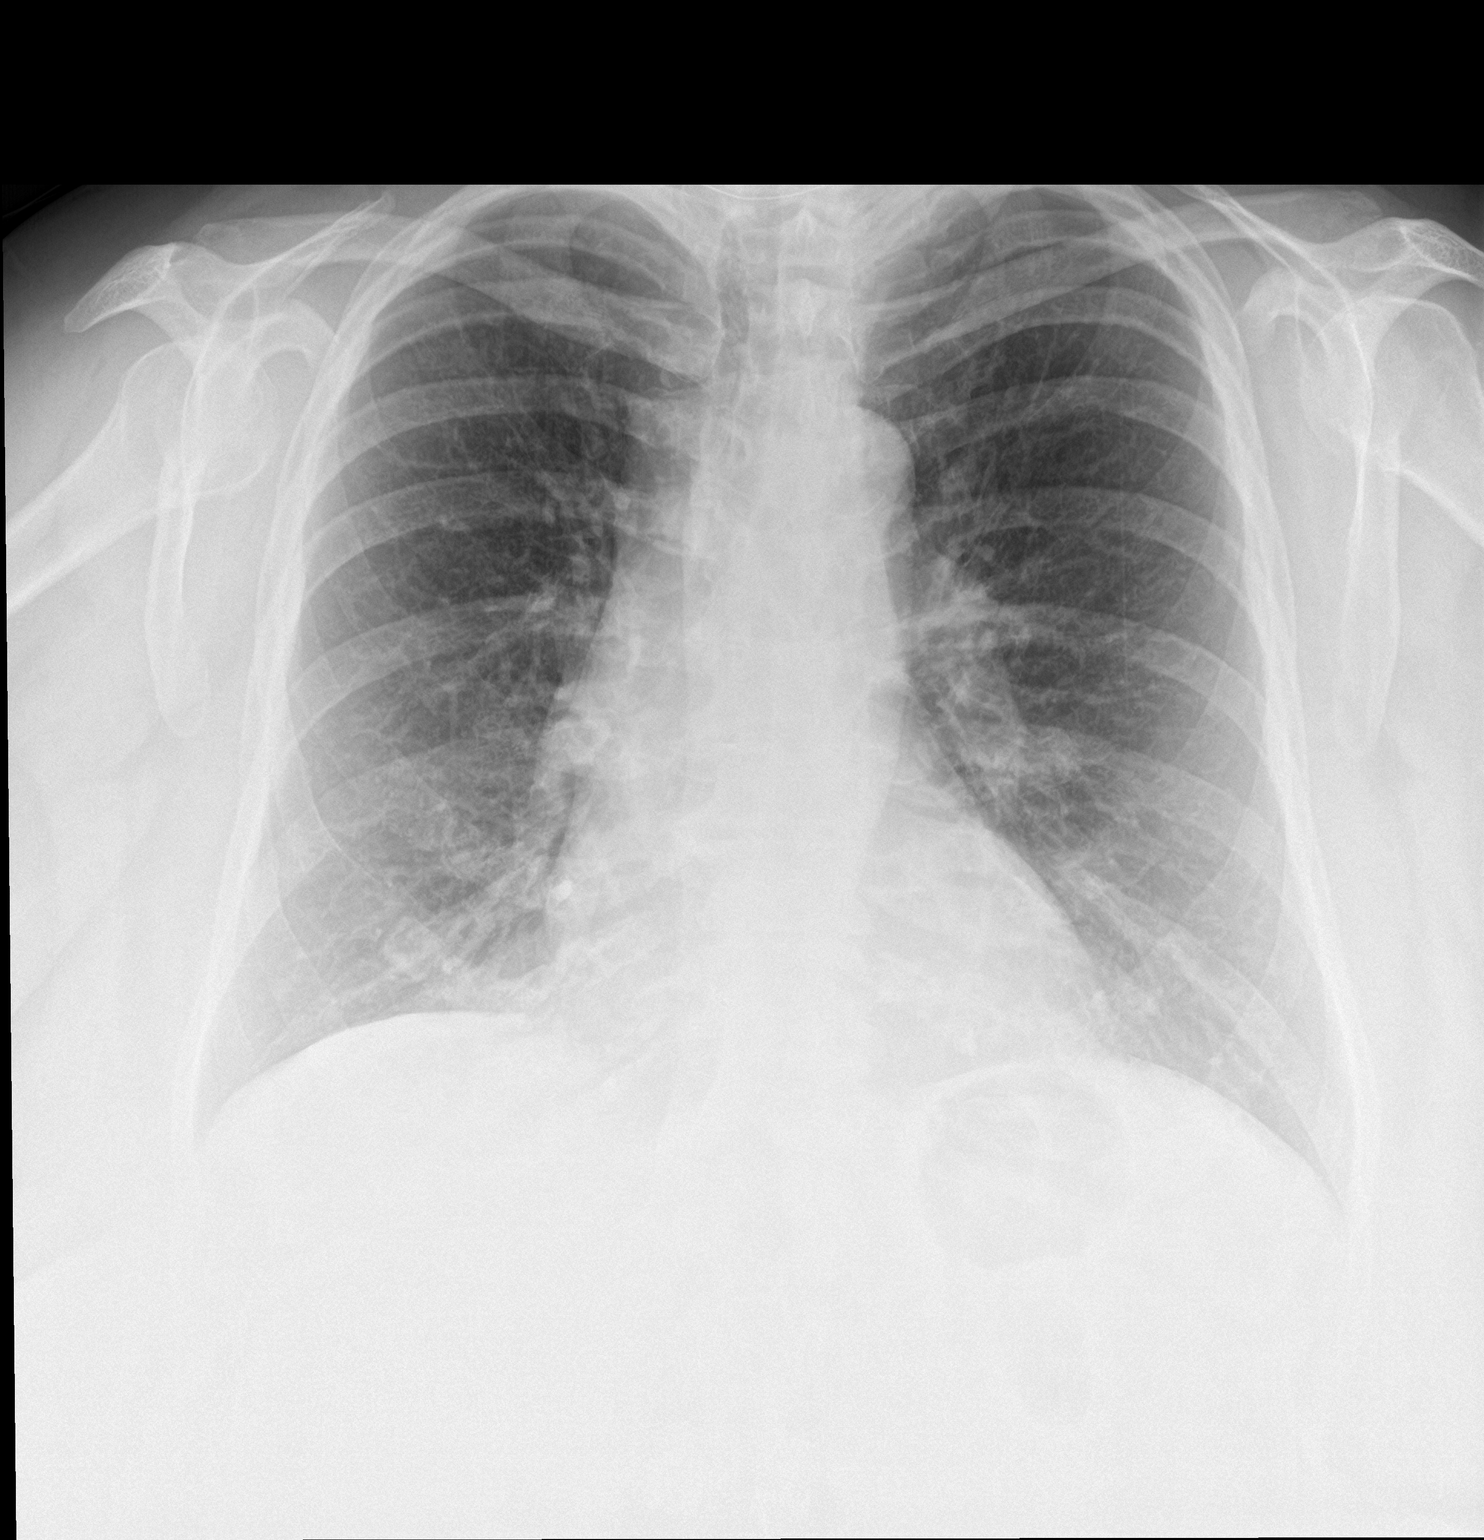

[chest lat]
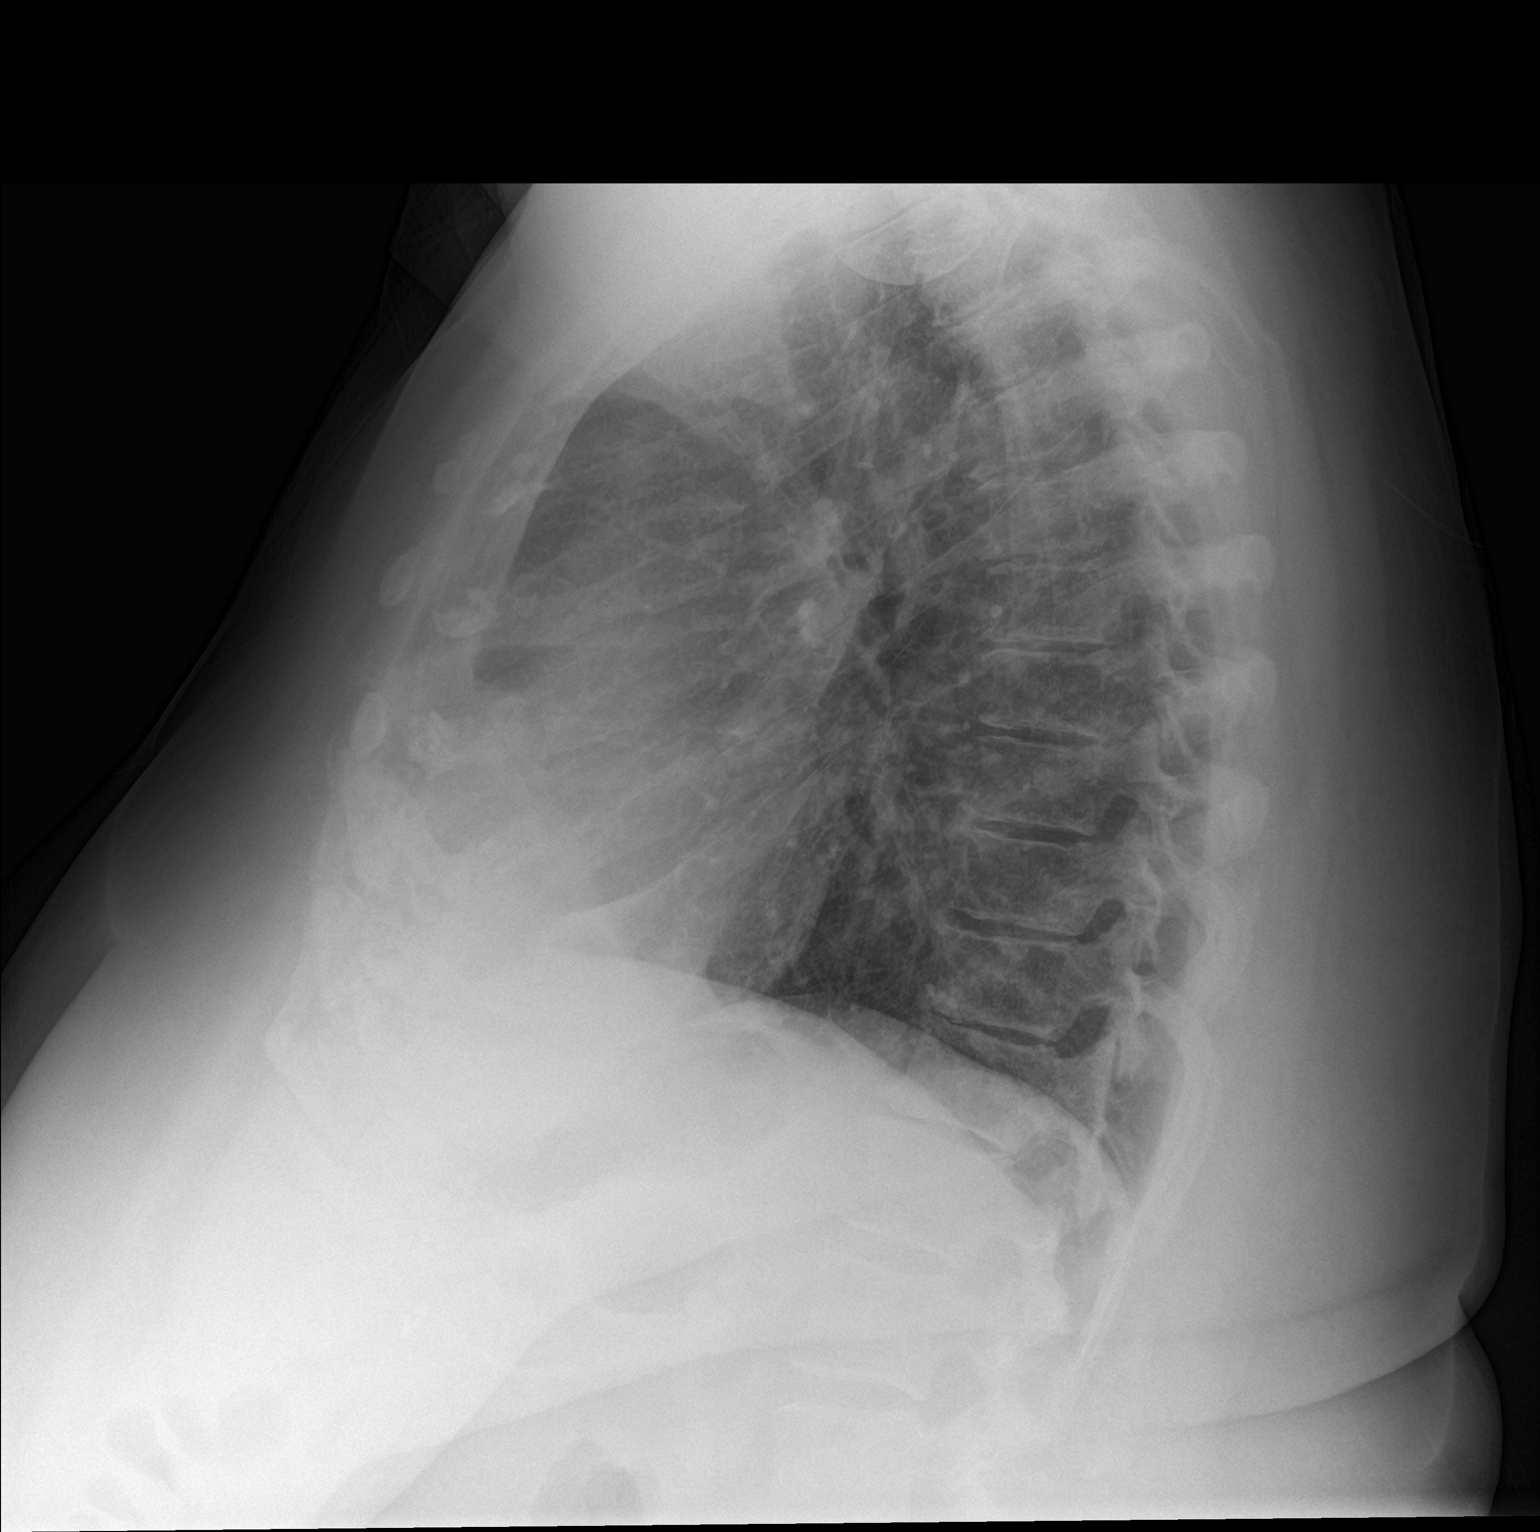

[2 of 2 positions shown; findings below may reference images not displayed]

FINDINGS: The lungs are adequately inflated. There is no focal infiltrate.
There is no pleural effusion. The heart and pulmonary vascularity
are normal. The mediastinum is normal in width. There is
calcification in the wall of the aortic arch. The bony thorax
exhibits no acute abnormality.
IMPRESSION: There is no pneumonia nor other acute cardiopulmonary abnormality.

Thoracic aortic atherosclerosis.

## 2018-03-11 ENCOUNTER — Ambulatory Visit: Payer: Medicare Other

## 2018-06-25 ENCOUNTER — Ambulatory Visit: Payer: Medicare Other

## 2018-06-25 ENCOUNTER — Ambulatory Visit
Admission: RE | Admit: 2018-06-25 | Discharge: 2018-06-25 | Disposition: A | Discharge status: Home / Self Care | Payer: Medicare Other | Source: Ambulatory Visit | Attending: Internal Medicine | Admitting: Internal Medicine

## 2018-06-25 DIAGNOSIS — Z1231 Encounter for screening mammogram for malignant neoplasm of breast: Secondary | ICD-10-CM

## 2018-08-05 ENCOUNTER — Other Ambulatory Visit: Payer: Self-pay

## 2018-08-05 ENCOUNTER — Encounter (HOSPITAL_BASED_OUTPATIENT_CLINIC_OR_DEPARTMENT_OTHER): Payer: Self-pay | Admitting: Emergency Medicine

## 2018-08-05 DIAGNOSIS — N200 Calculus of kidney: Secondary | ICD-10-CM | POA: Insufficient documentation

## 2018-08-05 DIAGNOSIS — R1031 Right lower quadrant pain: Secondary | ICD-10-CM | POA: Diagnosis present

## 2018-08-05 DIAGNOSIS — N23 Unspecified renal colic: Secondary | ICD-10-CM | POA: Insufficient documentation

## 2018-08-05 DIAGNOSIS — E119 Type 2 diabetes mellitus without complications: Secondary | ICD-10-CM | POA: Diagnosis not present

## 2018-08-05 DIAGNOSIS — Z7984 Long term (current) use of oral hypoglycemic drugs: Secondary | ICD-10-CM | POA: Insufficient documentation

## 2018-08-05 NOTE — ED Triage Notes (Signed)
Pt c/o 9/10 right flank pain, was diagnose by pcp with a UTI and took her first dose of Cipro today at 6 pm.

## 2018-08-06 ENCOUNTER — Emergency Department (HOSPITAL_BASED_OUTPATIENT_CLINIC_OR_DEPARTMENT_OTHER)
Admission: EM | Admit: 2018-08-06 | Discharge: 2018-08-06 | Disposition: A | Discharge status: Home / Self Care | Payer: Medicare Other | Attending: Emergency Medicine | Admitting: Emergency Medicine

## 2018-08-06 ENCOUNTER — Emergency Department (HOSPITAL_BASED_OUTPATIENT_CLINIC_OR_DEPARTMENT_OTHER): Payer: Medicare Other

## 2018-08-06 ENCOUNTER — Telehealth (HOSPITAL_BASED_OUTPATIENT_CLINIC_OR_DEPARTMENT_OTHER): Payer: Self-pay | Admitting: Emergency Medicine

## 2018-08-06 DIAGNOSIS — N200 Calculus of kidney: Secondary | ICD-10-CM

## 2018-08-06 DIAGNOSIS — N23 Unspecified renal colic: Secondary | ICD-10-CM

## 2018-08-06 LAB — URINALYSIS, ROUTINE W REFLEX MICROSCOPIC
BILIRUBIN URINE: NEGATIVE
Glucose, UA: NEGATIVE mg/dL
Ketones, ur: NEGATIVE mg/dL
NITRITE: NEGATIVE
PH: 5.5 (ref 5.0–8.0)
Protein, ur: NEGATIVE mg/dL
Specific Gravity, Urine: 1.015 (ref 1.005–1.030)

## 2018-08-06 LAB — CBC WITH DIFFERENTIAL/PLATELET
Abs Immature Granulocytes: 0.05 10*3/uL (ref 0.00–0.07)
Basophils Absolute: 0 10*3/uL (ref 0.0–0.1)
Basophils Relative: 0 %
EOS ABS: 0.3 10*3/uL (ref 0.0–0.5)
EOS PCT: 3 %
HEMATOCRIT: 42.7 % (ref 36.0–46.0)
HEMOGLOBIN: 13.1 g/dL (ref 12.0–15.0)
Immature Granulocytes: 0 %
LYMPHS ABS: 2.8 10*3/uL (ref 0.7–4.0)
Lymphocytes Relative: 23 %
MCH: 27.3 pg (ref 26.0–34.0)
MCHC: 30.7 g/dL (ref 30.0–36.0)
MCV: 89.1 fL (ref 80.0–100.0)
MONOS PCT: 6 %
Monocytes Absolute: 0.8 10*3/uL (ref 0.1–1.0)
NRBC: 0 % (ref 0.0–0.2)
Neutro Abs: 7.9 10*3/uL — ABNORMAL HIGH (ref 1.7–7.7)
Neutrophils Relative %: 68 %
Platelets: 255 10*3/uL (ref 150–400)
RBC: 4.79 MIL/uL (ref 3.87–5.11)
RDW: 13.7 % (ref 11.5–15.5)
WBC: 11.8 10*3/uL — ABNORMAL HIGH (ref 4.0–10.5)

## 2018-08-06 LAB — URINALYSIS, MICROSCOPIC (REFLEX): Bacteria, UA: NONE SEEN

## 2018-08-06 LAB — BASIC METABOLIC PANEL
Anion gap: 8 (ref 5–15)
BUN: 15 mg/dL (ref 8–23)
CHLORIDE: 107 mmol/L (ref 98–111)
CO2: 23 mmol/L (ref 22–32)
Calcium: 9.1 mg/dL (ref 8.9–10.3)
Creatinine, Ser: 1.19 mg/dL — ABNORMAL HIGH (ref 0.44–1.00)
GFR calc non Af Amer: 48 mL/min — ABNORMAL LOW (ref >60)
GFR, EST AFRICAN AMERICAN: 55 mL/min — AB (ref >60)
Glucose, Bld: 168 mg/dL — ABNORMAL HIGH (ref 70–99)
Potassium: 4.1 mmol/L (ref 3.5–5.1)
SODIUM: 138 mmol/L (ref 135–145)

## 2018-08-06 MED ORDER — ACETAMINOPHEN 500 MG PO TABS
1000.0000 mg | ORAL_TABLET | Freq: Once | ORAL | Status: AC
  Administered 2018-08-06: 1000 mg via ORAL
  Filled 2018-08-06: qty 2

## 2018-08-06 MED ORDER — TAMSULOSIN HCL 0.4 MG PO CAPS: 0.4000 mg | ORAL_CAPSULE | Freq: Every day | ORAL | 0 refills | Status: AC

## 2018-08-06 MED ORDER — OXYCODONE-ACETAMINOPHEN 5-325 MG PO TABS: 1.0000 | ORAL_TABLET | Freq: Four times a day (QID) | ORAL | 0 refills | Status: DC | PRN

## 2018-08-06 MED ORDER — ONDANSETRON 4 MG PO TBDP: 4.0000 mg | ORAL_TABLET | Freq: Three times a day (TID) | ORAL | 0 refills | Status: DC | PRN

## 2018-08-06 MED ORDER — SODIUM CHLORIDE 0.9 % IV BOLUS
1000.0000 mL | Freq: Once | INTRAVENOUS | Status: AC
  Administered 2018-08-06: 1000 mL via INTRAVENOUS

## 2018-08-06 MED ORDER — TAMSULOSIN HCL 0.4 MG PO CAPS: 0.4000 mg | ORAL_CAPSULE | Freq: Every day | ORAL | 0 refills | Status: DC

## 2018-08-06 MED ORDER — KETOROLAC TROMETHAMINE 15 MG/ML IJ SOLN
15.0000 mg | Freq: Once | INTRAMUSCULAR | Status: AC
  Administered 2018-08-06: 15 mg via INTRAVENOUS
  Filled 2018-08-06: qty 1

## 2018-08-06 NOTE — Discharge Instructions (Signed)
Continue taking the Cipro prescribed by your doctor  Drink plenty of fluids  Take the meds as prescribed.   I'd recommend a stool softener such as colace when taking pain medications  The FLOMAX will help you pass the stone, but make sure you're drinking fluids and be careful when standing up, as it can cause some lightheadedness

## 2018-08-06 NOTE — ED Provider Notes (Signed)
[facility] EMERGENCY DEPARTMENT Provider Note   CSN: [id number] Arrival date & time: 08/05/18  2334    History   Chief Complaint Chief Complaint  Patient presents with  . Flank Pain    HPI Olivia Walker is a 68 y.o. female.     HPI   68 yo F with PMHx as below including chronic UTIs, kidney stones here with R flank pain. Pt reports that earlier today, she experienced acute onset of initially moderate R flank pain, with hematuria. She denies any overt dysuria. No nausea, vomiting. No left-sided pain. She went ot her PCP and was told she may have UTI and was stated on Cipro. She had felt better but then developed now severe aching, gnawing, R flank pain so she presents again to ED. Pain has now improved. Denies any fever or chills. No n/v/d. No recent sick contacts. No dysuria or frequency.  Past Medical History:  Diagnosis Date  . Ankle swelling   . Anxiety   . Arthritis   . Chronic pain    knees, hips  . Depression   . Depression with anxiety 10/08/2012    Treated since 1995 with Prozac, disability.   . Diabetes mellitus without complication (HCC)    prediabetic  . Difficulty concentrating   . GERD (gastroesophageal reflux disease)   . Hemorrhoids    mild  . High cholesterol   . Impaired glucose tolerance   . Kidney stone   . Mental disorder   . OA (osteoarthritis)    hips, knees  . Obesity   . Stones in the urinary tract     Patient Active Problem List   Diagnosis Date Noted  . Insomnia, persistent 10/08/2012  . Depression with anxiety 10/08/2012  . OA (osteoarthritis) of hip 07/07/2012    Past Surgical History:  Procedure Laterality Date  . CHOLECYSTECTOMY  12/30/2011   Procedure: LAPAROSCOPIC CHOLECYSTECTOMY;  Surgeon: Emelia Loron, MD;  Location: [facility] OR;  Service: General;  Laterality: N/A;  . HAND SURGERY     rt carpal tunnel 90  . kidney stones    . KNEE SURGERY Bilateral 01:left,1985:right   torn meniscus lft  . KNEE SURGERY  1985    lateral release  . NASAL SEPTUM SURGERY    . TOTAL HIP ARTHROPLASTY Right 07/07/2012   Procedure: TOTAL HIP ARTHROPLASTY;  Surgeon: Loanne Drilling, MD;  Location: [facility] ORS;  Service: Orthopedics;  Laterality: Right;  . TOTAL HIP ARTHROPLASTY  2014     OB History   No obstetric history on file.      Home Medications    Prior to Admission medications   Medication Sig Start Date End Date Taking? Authorizing Provider  ALPRAZolam Prudy Feeler) 0.5 MG tablet Take 0.5 mg by mouth at bedtime as needed. Anxiety/sleep    [provider]  ATORVASTATIN CALCIUM PO Take by mouth.    [provider]  ciprofloxacin (CIPRO) 500 MG tablet Take 1 tablet (500 mg total) by mouth 2 (two) times daily. 01/05/18   Terrilee Files, MD  FLUoxetine (PROZAC) 20 MG capsule Take 20 mg by mouth daily before breakfast.     [provider]  FLUoxetine (PROZAC) 40 MG capsule Take 40 mg by mouth daily.    [provider]  Meloxicam (MOBIC PO) Take by mouth.    [provider]  METFORMIN HCL PO Take by mouth.    [provider]  methylphenidate (RITALIN) 10 MG tablet Take 10 mg by mouth 2 (  two) times daily as needed. 1/2 tablet daily    [provider]  ondansetron (ZOFRAN ODT) 4 MG disintegrating tablet Take 1 tablet (4 mg total) by mouth every 8 (eight) hours as needed for nausea or vomiting. 08/06/18   Shaune Pollack, MD  oxyCODONE-acetaminophen (PERCOCET/ROXICET) 5-325 MG tablet Take 1-2 tablets by mouth every 6 (six) hours as needed for moderate pain or severe pain. 08/06/18   Shaune Pollack, MD  predniSONE (DELTASONE) 20 MG tablet Take 2 tablets (40 mg total) by mouth daily. 06/16/16   Gwyneth Sprout, MD  tamsulosin (FLOMAX) 0.4 MG CAPS capsule Take 1 capsule (0.4 mg total) by mouth daily after supper for 5 days. 08/06/18 08/11/18  Shaune Pollack, MD    Family History Family History  Problem Relation Age of Onset  . Diabetes Mother   . Heart Problems  Mother   . Breast cancer Neg Hx     Social History Social History   Tobacco Use  . Smoking status: Never Smoker  . Smokeless tobacco: Never Used  Substance Use Topics  . Alcohol use: No  . Drug use: No     Allergies   Penicillins   Review of Systems Review of Systems  Constitutional: Negative for chills and fever.  HENT: Negative for congestion, rhinorrhea and sore throat.   Eyes: Negative for visual disturbance.  Respiratory: Negative for cough, shortness of breath and wheezing.   Cardiovascular: Negative for chest pain and leg swelling.  Gastrointestinal: Positive for abdominal pain. Negative for diarrhea, nausea and vomiting.  Genitourinary: Positive for flank pain and hematuria. Negative for dysuria, vaginal bleeding and vaginal discharge.  Musculoskeletal: Negative for neck pain.  Skin: Negative for rash.  Allergic/Immunologic: Negative for immunocompromised state.  Neurological: Negative for syncope and headaches.  Hematological: Does not bruise/bleed easily.  All other systems reviewed and are negative.    Physical Exam Updated Vital Signs BP (!) 166/92 (BP Location: Right Arm)   Pulse 71   Temp 98.2 F (36.8 C) (Oral)   Resp 18   Ht  (1.651 m)   Wt 104.3 kg   SpO2 98%   BMI 38.27 kg/m   Physical Exam Vitals signs and nursing note reviewed.  Constitutional:      General: She is not in acute distress.    Appearance: She is well-developed.     Comments: Well appearing, smiling in NAD  HENT:     Head: Normocephalic and atraumatic.  Eyes:     Conjunctiva/sclera: Conjunctivae normal.  Neck:     Musculoskeletal: Neck supple.  Cardiovascular:     Rate and Rhythm: Normal rate and regular rhythm.     Heart sounds: Normal heart sounds. No murmur. No friction rub.  Pulmonary:     Effort: Pulmonary effort is normal. No respiratory distress.     Breath sounds: Normal breath sounds. No wheezing or rales.  Abdominal:     General: There is no  distension.     Palpations: Abdomen is soft.     Tenderness: There is no abdominal tenderness.     Comments: No CVAT. No abd TTP.  Skin:    General: Skin is warm.     Capillary Refill: Capillary refill takes less than 2 seconds.  Neurological:     Mental Status: She is alert and oriented to person, place, and time.     Motor: No abnormal muscle tone.      ED Treatments / Results  Labs (all labs ordered are listed, but only  abnormal results are displayed) Labs Reviewed  URINALYSIS, ROUTINE W REFLEX MICROSCOPIC - Abnormal; Notable for the following components:      Result Value   Hgb urine dipstick LARGE (*)    Leukocytes,Ua TRACE (*)    All other components within normal limits  CBC WITH DIFFERENTIAL/PLATELET - Abnormal; Notable for the following components:   WBC 11.8 (*)    Neutro Abs 7.9 (*)    All other components within normal limits  BASIC METABOLIC PANEL - Abnormal; Notable for the following components:   Glucose, Bld 168 (*)    Creatinine, Ser 1.19 (*)    GFR calc non Af Amer 48 (*)    GFR calc Af Amer 55 (*)    All other components within normal limits  URINE CULTURE  URINALYSIS, MICROSCOPIC (REFLEX)    EKG None  Radiology Ct Renal Stone Study  Result Date: 08/06/2018 CLINICAL DATA:  Right flank pain and hematuria. Patient was diagnosed with a UTI yesterday. EXAM: CT ABDOMEN AND PELVIS WITHOUT CONTRAST TECHNIQUE: Multidetector CT imaging of the abdomen and pelvis was performed following the standard protocol without IV contrast. COMPARISON:  09/01/2008 CT FINDINGS: Lower chest: Borderline cardiomegaly without pericardial effusion. Small hiatal hernia. Clear lung bases. Hepatobiliary: No focal liver abnormality is seen. Status post cholecystectomy. No biliary dilatation. Pancreas: Unremarkable. No pancreatic ductal dilatation or surrounding inflammatory changes. Spleen: Normal in size without focal abnormality. Adrenals/Urinary Tract: Normal bilateral adrenal glands.  Bilateral nonobstructing renal calculi with right 3 mm proximal right ureteral stone, seen just beyond the right UPJ with mild right-sided hydronephrosis. Perinephric fat stranding is seen about the right kidney. No calculus along the left ureter. The urinary bladder is somewhat thick-walled in appearance and cystitis is not excluded though most of this may be due to underdistention. Stomach/Bowel: Stomach is within normal limits. Appendix appears normal. No evidence of bowel wall thickening, distention, or inflammatory changes. Vascular/Lymphatic: No significant vascular findings are present. Scattered aortic atherosclerosis without aneurysm. No enlarged abdominal or pelvic lymph nodes. Reproductive: Uterus and bilateral adnexa are unremarkable. Other: No free air nor free fluid. Musculoskeletal: Uncemented right hip arthroplasty without complicating features. Degenerative disc disease at L5-S1 and of the included lower thoracic spine from T10 through L1. IMPRESSION: 1. 3 mm calculus just beyond the right UPJ resulting in mild right-sided hydronephrosis and perinephric fat stranding. 2. Bilateral non-obstructing additional renal calculi are noted. 3. Nonaneurysmal atherosclerotic aorta. 4. Right hip arthroplasty without complicating features. 5. Mild degenerative change along the included lower thoracic spine and at L5-S1. Electronically Signed   By: Tollie Eth M.D.   On: 08/06/2018 02:18    Procedures Procedures (including critical care time)  Medications Ordered in ED Medications  sodium chloride 0.9 % bolus 1,000 mL (1,000 mLs Intravenous New Bag/Given 08/06/18 0250)  acetaminophen (TYLENOL) tablet 1,000 mg (1,000 mg Oral Given 08/06/18 0255)  ketorolac (TORADOL) 15 MG/ML injection 15 mg (15 mg Intravenous Given 08/06/18 0254)     Initial Impression / Assessment and Plan / ED Course  I have reviewed the triage vital signs and the nursing notes.  Pertinent labs & imaging results that were available  during my care of the patient were reviewed by me and considered in my medical decision making (see chart for details).        68 yo F here with R flank pain, now mostly improved. Labs show mild leukocytosis. UA shows hematuria but no bacteria, no WBCs - doubt UTI. BMP with minimal increase in Cr -  fluids given. CT imaging is c/w 3 mm stone right UPJ. Pain improved in ED and pt has been given fluids, analgesics. She is tolerating PO. No signs of septic stone, intractable pain or n/v. Will start on home meds, have her continue Cipro to be safe, and refer to Urology  Final Clinical Impressions(s) / ED Diagnoses   Final diagnoses:  Kidney stone  Ureteral colic    ED Discharge Orders         Ordered    oxyCODONE-acetaminophen (PERCOCET/ROXICET) 5-325 MG tablet  Every 6 hours PRN     08/06/18 0354    ondansetron (ZOFRAN ODT) 4 MG disintegrating tablet  Every 8 hours PRN     08/06/18 0354    tamsulosin (FLOMAX) 0.4 MG CAPS capsule  Daily after supper     08/06/18 0354           Shaune Pollack, MD 08/06/18 [phone]

## 2018-08-06 NOTE — Telephone Encounter (Signed)
Patient's medications were sent to the wrong pharmacy and she is requesting them to be sent to [facility].  When calling [facility] they could not transfer the medications to [place] till Monday and she was found to have a kidney stone and is requesting that she needs the pain medication now.

## 2018-08-07 LAB — URINE CULTURE: Culture: <10000 — AB

## 2018-08-08 ENCOUNTER — Other Ambulatory Visit: Payer: Self-pay

## 2018-08-08 ENCOUNTER — Emergency Department (HOSPITAL_BASED_OUTPATIENT_CLINIC_OR_DEPARTMENT_OTHER)
Admission: EM | Admit: 2018-08-08 | Discharge: 2018-08-08 | Disposition: A | Discharge status: Home / Self Care | Payer: Medicare Other | Attending: Emergency Medicine | Admitting: Emergency Medicine

## 2018-08-08 ENCOUNTER — Encounter (HOSPITAL_BASED_OUTPATIENT_CLINIC_OR_DEPARTMENT_OTHER): Payer: Self-pay | Admitting: Emergency Medicine

## 2018-08-08 DIAGNOSIS — E119 Type 2 diabetes mellitus without complications: Secondary | ICD-10-CM | POA: Diagnosis not present

## 2018-08-08 DIAGNOSIS — N200 Calculus of kidney: Secondary | ICD-10-CM | POA: Insufficient documentation

## 2018-08-08 DIAGNOSIS — R109 Unspecified abdominal pain: Secondary | ICD-10-CM | POA: Diagnosis present

## 2018-08-08 DIAGNOSIS — Z96651 Presence of right artificial knee joint: Secondary | ICD-10-CM | POA: Diagnosis not present

## 2018-08-08 DIAGNOSIS — Z79899 Other long term (current) drug therapy: Secondary | ICD-10-CM | POA: Diagnosis not present

## 2018-08-08 DIAGNOSIS — N23 Unspecified renal colic: Secondary | ICD-10-CM | POA: Diagnosis not present

## 2018-08-08 MED ORDER — ONDANSETRON HCL 4 MG/2ML IJ SOLN
4.0000 mg | Freq: Once | INTRAMUSCULAR | Status: AC
Start: 2018-08-08 — End: 2018-08-08
  Administered 2018-08-08: 4 mg via INTRAVENOUS
  Filled 2018-08-08: qty 2

## 2018-08-08 MED ORDER — KETOROLAC TROMETHAMINE 30 MG/ML IJ SOLN
30.0000 mg | Freq: Once | INTRAMUSCULAR | Status: AC
  Administered 2018-08-08: 30 mg via INTRAVENOUS
  Filled 2018-08-08: qty 1

## 2018-08-08 MED ORDER — HYDROMORPHONE HCL 1 MG/ML IJ SOLN
1.0000 mg | Freq: Once | INTRAMUSCULAR | Status: AC
  Administered 2018-08-08: 1 mg via INTRAVENOUS
  Filled 2018-08-08: qty 1

## 2018-08-08 NOTE — ED Triage Notes (Signed)
Pt reports continues to have pain from kidney stone. Pt reports here Thursday night for right flank pain and had a CT done.

## 2018-08-08 NOTE — Discharge Instructions (Signed)
It was our pleasure to provide your ER care today - we hope that you feel better.  Drink plenty of fluids. Take your medication/pain medication as need.   Follow up with urologist tomorrow as planned.   Your blood pressure is high today - follow up with primary care doctor for recheck in the next couple of weeks.   Return to ER if worse, new symptoms, fevers, persistent vomiting, intractable pain, other concern.   You were given pain medication in the ER - no driving for the next 6 hours.

## 2018-08-08 NOTE — ED Provider Notes (Signed)
[facility] EMERGENCY DEPARTMENT Provider Note   CSN: [id number] Arrival date & time: 08/08/18  0732    History   Chief Complaint Chief Complaint  Patient presents with  . Nephrolithiasis    HPI Olivia Walker is a 68 y.o. female.     Patient c/o acute onset right flank pain 3 days ago. Was seen in ED and dx with 3 mm right proximal ureteral stone. Pain waxing and waning since, and worse this AM. Pain constant, dull, severe, right flank laterally. +nausea. No vomiting or diarrhea. No dysuria or hematuria. No fever or chills. Tried percocet at home but pain no better.   The history is provided by the patient.    Past Medical History:  Diagnosis Date  . Ankle swelling   . Anxiety   . Arthritis   . Chronic pain    knees, hips  . Depression   . Depression with anxiety 10/08/2012    Treated since 1995 with Prozac, disability.   . Diabetes mellitus without complication (HCC)    prediabetic  . Difficulty concentrating   . GERD (gastroesophageal reflux disease)   . Hemorrhoids    mild  . High cholesterol   . Impaired glucose tolerance   . Kidney stone   . Mental disorder   . OA (osteoarthritis)    hips, knees  . Obesity   . Stones in the urinary tract     Patient Active Problem List   Diagnosis Date Noted  . Insomnia, persistent 10/08/2012  . Depression with anxiety 10/08/2012  . OA (osteoarthritis) of hip 07/07/2012    Past Surgical History:  Procedure Laterality Date  . CHOLECYSTECTOMY  12/30/2011   Procedure: LAPAROSCOPIC CHOLECYSTECTOMY;  Surgeon: Emelia Loron, MD;  Location: [facility] OR;  Service: General;  Laterality: N/A;  . HAND SURGERY     rt carpal tunnel 90  . kidney stones    . KNEE SURGERY Bilateral 01:left,1985:right   torn meniscus lft  . KNEE SURGERY  1985   lateral release  . NASAL SEPTUM SURGERY    . TOTAL HIP ARTHROPLASTY Right 07/07/2012   Procedure: TOTAL HIP ARTHROPLASTY;  Surgeon: Loanne Drilling, MD;  Location: [facility] ORS;   Service: Orthopedics;  Laterality: Right;  . TOTAL HIP ARTHROPLASTY  2014     OB History   No obstetric history on file.      Home Medications    Prior to Admission medications   Medication Sig Start Date End Date Taking? Authorizing Provider  ALPRAZolam Prudy Feeler) 0.5 MG tablet Take 0.5 mg by mouth at bedtime as needed. Anxiety/sleep    [provider]  ATORVASTATIN CALCIUM PO Take by mouth.    [provider]  ciprofloxacin (CIPRO) 500 MG tablet Take 1 tablet (500 mg total) by mouth 2 (two) times daily. 01/05/18   Terrilee Files, MD  FLUoxetine (PROZAC) 20 MG capsule Take 20 mg by mouth daily before breakfast.     [provider]  FLUoxetine (PROZAC) 40 MG capsule Take 40 mg by mouth daily.    [provider]  Meloxicam (MOBIC PO) Take by mouth.    [provider]  METFORMIN HCL PO Take by mouth.    [provider]  methylphenidate (RITALIN) 10 MG tablet Take 10 mg by mouth 2 (two) times daily as needed. 1/2 tablet daily    [provider]  ondansetron (ZOFRAN ODT) 4 MG disintegrating tablet Take 1 tablet (4 mg total) by mouth every 8 (eight) hours  as needed for nausea or vomiting. 08/06/18   Gwyneth Sprout, MD  oxyCODONE-acetaminophen (PERCOCET/ROXICET) 5-325 MG tablet Take 1-2 tablets by mouth every 6 (six) hours as needed for moderate pain or severe pain. 08/06/18   Gwyneth Sprout, MD  predniSONE (DELTASONE) 20 MG tablet Take 2 tablets (40 mg total) by mouth daily. 06/16/16   Gwyneth Sprout, MD  tamsulosin (FLOMAX) 0.4 MG CAPS capsule Take 1 capsule (0.4 mg total) by mouth daily after supper for 5 days. 08/06/18 08/11/18  Gwyneth Sprout, MD    Family History Family History  Problem Relation Age of Onset  . Diabetes Mother   . Heart Problems Mother   . Breast cancer Neg Hx     Social History Social History   Tobacco Use  . Smoking status: Never Smoker  . Smokeless tobacco: Never Used  Substance Use  Topics  . Alcohol use: No  . Drug use: No     Allergies   Penicillins   Review of Systems Review of Systems  Constitutional: Negative for chills and fever.  HENT: Negative for sore throat.   Eyes: Negative for redness.  Respiratory: Negative for shortness of breath.   Cardiovascular: Negative for chest pain.  Gastrointestinal: Positive for nausea.  Endocrine: Negative for polyuria.  Genitourinary: Positive for flank pain. Negative for dysuria.  Musculoskeletal: Negative for back pain and neck pain.  Skin: Negative for rash.  Neurological: Negative for headaches.  Hematological: Does not bruise/bleed easily.  Psychiatric/Behavioral: Negative for confusion.     Physical Exam Updated Vital Signs BP (!) 170/90 (BP Location: Right Arm)   Pulse 86   Temp 98.2 F (36.8 C) (Oral)   Resp 18   Ht 1.651 m (5\' 5" )   Wt 104.3 kg   SpO2 95%   BMI 38.26 kg/m   Physical Exam Vitals signs and nursing note reviewed.  Constitutional:      Appearance: Normal appearance. She is well-developed.  HENT:     Head: Atraumatic.     Nose: Nose normal.     Mouth/Throat:     Mouth: Mucous membranes are moist.  Eyes:     General: No scleral icterus.    Conjunctiva/sclera: Conjunctivae normal.  Neck:     Musculoskeletal: Normal range of motion and neck supple. No neck rigidity or muscular tenderness.     Trachea: No tracheal deviation.  Cardiovascular:     Rate and Rhythm: Normal rate and regular rhythm.     Pulses: Normal pulses.     Heart sounds: Normal heart sounds. No murmur. No friction rub. No gallop.   Pulmonary:     Effort: Pulmonary effort is normal. No respiratory distress.     Breath sounds: Normal breath sounds.  Abdominal:     General: Bowel sounds are normal. There is no distension.     Palpations: Abdomen is soft. There is no mass.     Tenderness: There is no abdominal tenderness. There is no guarding.     Hernia: No hernia is present.  Genitourinary:    Comments:  No cva tenderness.  Musculoskeletal:        General: No swelling.  Skin:    General: Skin is warm and dry.     Findings: No rash.  Neurological:     Mental Status: She is alert.     Comments: Alert, speech normal.   Psychiatric:        Mood and Affect: Mood normal.      ED Treatments / Results  Labs (all labs ordered are listed, but only abnormal results are displayed) Labs Reviewed - No data to display  EKG None  Radiology No results found.  Procedures Procedures (including critical care time)  Medications Ordered in ED Medications  HYDROmorphone (DILAUDID) injection 1 mg (has no administration in time range)  ketorolac (TORADOL) 30 MG/ML injection 30 mg (has no administration in time range)  ondansetron (ZOFRAN) injection 4 mg (has no administration in time range)     Initial Impression / Assessment and Plan / ED Course  I have reviewed the triage vital signs and the nursing notes.  Pertinent labs & imaging results that were available during my care of the patient were reviewed by me and considered in my medical decision making (see chart for details).  Iv ns. Dilaudid 1 mg iv. zofran iv. toradol iv.  Reviewed nursing notes and prior charts for additional history.  CT 3 days ago shows 3 mm kidney stone in the proximal right ureter.  Urine culture from prior visit now back - negative.   Recheck pain resolved. No nv.   Patient currently appears stable for d/c.  Pt indicates has plans to f/u with her urologist tomorrow.       Final Clinical Impressions(s) / ED Diagnoses   Final diagnoses:  None    ED Discharge Orders    None       Cathren Laine, MD 08/08/18 [phone]

## 2018-08-12 ENCOUNTER — Emergency Department (HOSPITAL_BASED_OUTPATIENT_CLINIC_OR_DEPARTMENT_OTHER)
Admission: EM | Admit: 2018-08-12 | Discharge: 2018-08-12 | Disposition: A | Discharge status: Home / Self Care | Payer: Medicare Other | Attending: Emergency Medicine | Admitting: Emergency Medicine

## 2018-08-12 ENCOUNTER — Emergency Department (HOSPITAL_BASED_OUTPATIENT_CLINIC_OR_DEPARTMENT_OTHER): Payer: Medicare Other

## 2018-08-12 ENCOUNTER — Encounter (HOSPITAL_BASED_OUTPATIENT_CLINIC_OR_DEPARTMENT_OTHER): Payer: Self-pay

## 2018-08-12 ENCOUNTER — Other Ambulatory Visit: Payer: Self-pay

## 2018-08-12 DIAGNOSIS — R109 Unspecified abdominal pain: Secondary | ICD-10-CM | POA: Diagnosis present

## 2018-08-12 DIAGNOSIS — Z96641 Presence of right artificial hip joint: Secondary | ICD-10-CM | POA: Insufficient documentation

## 2018-08-12 DIAGNOSIS — E119 Type 2 diabetes mellitus without complications: Secondary | ICD-10-CM | POA: Diagnosis not present

## 2018-08-12 DIAGNOSIS — Z79899 Other long term (current) drug therapy: Secondary | ICD-10-CM | POA: Insufficient documentation

## 2018-08-12 DIAGNOSIS — N200 Calculus of kidney: Secondary | ICD-10-CM

## 2018-08-12 MED ORDER — OXYCODONE-ACETAMINOPHEN 5-325 MG PO TABS: 1.0000 | ORAL_TABLET | Freq: Four times a day (QID) | ORAL | 0 refills | Status: DC | PRN

## 2018-08-12 MED ORDER — ONDANSETRON 8 MG PO TBDP
ORAL_TABLET | ORAL | Status: AC
  Administered 2018-08-12: 8 mg
  Filled 2018-08-12: qty 1

## 2018-08-12 MED ORDER — DICLOFENAC SODIUM ER 100 MG PO TB24: 100.0000 mg | ORAL_TABLET | Freq: Every day | ORAL | 0 refills | Status: DC

## 2018-08-12 MED ORDER — FENTANYL CITRATE (PF) 100 MCG/2ML IJ SOLN
INTRAMUSCULAR | Status: AC
  Administered 2018-08-12: 50 ug
  Filled 2018-08-12: qty 2

## 2018-08-12 MED ORDER — ONDANSETRON 8 MG PO TBDP: ORAL_TABLET | ORAL | 0 refills | Status: DC

## 2018-08-12 MED ORDER — TAMSULOSIN HCL 0.4 MG PO CAPS: 0.4000 mg | ORAL_CAPSULE | Freq: Every day | ORAL | 0 refills | Status: AC

## 2018-08-12 NOTE — ED Triage Notes (Signed)
Pt states she was diagnosed with kidney stones on 3/13 but has not passed it yet.

## 2018-08-12 NOTE — ED Provider Notes (Signed)
[facility] EMERGENCY DEPARTMENT Provider Note   CSN: [id number] Arrival date & time: 08/12/18  0035    History   Chief Complaint Chief Complaint  Patient presents with  . Flank Pain    HPI Olivia Walker is a 68 y.o. female.     The history is provided by the patient.  Flank Pain  This is a new problem. The current episode started more than 1 week ago. The problem occurs constantly. The problem has not changed since onset.Pertinent negatives include no chest pain, no abdominal pain, no headaches and no shortness of breath. Nothing aggravates the symptoms. Nothing relieves the symptoms. Treatments tried: percocet, flomax  The treatment provided no relief.  Patient with known R UPJ stone, has not been able to follow up urology as she wanted until May and continues to have pain.  No f/c/r/.  No dysuria.    Past Medical History:  Diagnosis Date  . Ankle swelling   . Anxiety   . Arthritis   . Chronic pain    knees, hips  . Depression   . Depression with anxiety 10/08/2012    Treated since 1995 with Prozac, disability.   . Diabetes mellitus without complication (HCC)    prediabetic  . Difficulty concentrating   . GERD (gastroesophageal reflux disease)   . Hemorrhoids    mild  . High cholesterol   . Impaired glucose tolerance   . Kidney stone   . Mental disorder   . OA (osteoarthritis)    hips, knees  . Obesity   . Stones in the urinary tract     Patient Active Problem List   Diagnosis Date Noted  . Insomnia, persistent 10/08/2012  . Depression with anxiety 10/08/2012  . OA (osteoarthritis) of hip 07/07/2012    Past Surgical History:  Procedure Laterality Date  . CHOLECYSTECTOMY  12/30/2011   Procedure: LAPAROSCOPIC CHOLECYSTECTOMY;  Surgeon: Emelia Loron, MD;  Location: [facility] OR;  Service: General;  Laterality: N/A;  . HAND SURGERY     rt carpal tunnel 90  . kidney stones    . KNEE SURGERY Bilateral 01:left,1985:right   torn meniscus lft  . KNEE  SURGERY  1985   lateral release  . NASAL SEPTUM SURGERY    . TOTAL HIP ARTHROPLASTY Right 07/07/2012   Procedure: TOTAL HIP ARTHROPLASTY;  Surgeon: Loanne Drilling, MD;  Location: [facility] ORS;  Service: Orthopedics;  Laterality: Right;  . TOTAL HIP ARTHROPLASTY  2014     OB History   No obstetric history on file.      Home Medications    Prior to Admission medications   Medication Sig Start Date End Date Taking? Authorizing Provider  ALPRAZolam Prudy Feeler) 0.5 MG tablet Take 0.5 mg by mouth at bedtime as needed. Anxiety/sleep    [provider]  ATORVASTATIN CALCIUM PO Take by mouth.    [provider]  ciprofloxacin (CIPRO) 500 MG tablet Take 1 tablet (500 mg total) by mouth 2 (two) times daily. 01/05/18   Terrilee Files, MD  FLUoxetine (PROZAC) 20 MG capsule Take 20 mg by mouth daily before breakfast.     [provider]  FLUoxetine (PROZAC) 40 MG capsule Take 40 mg by mouth daily.    [provider]  Meloxicam (MOBIC PO) Take by mouth.    [provider]  METFORMIN HCL PO Take by mouth.    [provider]  methylphenidate (RITALIN) 10 MG tablet Take 10 mg by mouth 2 (two) times  daily as needed. 1/2 tablet daily    [provider]  ondansetron (ZOFRAN ODT) 4 MG disintegrating tablet Take 1 tablet (4 mg total) by mouth every 8 (eight) hours as needed for nausea or vomiting. 08/06/18   Gwyneth Sprout, MD  oxyCODONE-acetaminophen (PERCOCET/ROXICET) 5-325 MG tablet Take 1-2 tablets by mouth every 6 (six) hours as needed for moderate pain or severe pain. 08/06/18   Gwyneth Sprout, MD  predniSONE (DELTASONE) 20 MG tablet Take 2 tablets (40 mg total) by mouth daily. 06/16/16   Gwyneth Sprout, MD    Family History Family History  Problem Relation Age of Onset  . Diabetes Mother   . Heart Problems Mother   . Breast cancer Neg Hx     Social History Social History   Tobacco Use  . Smoking status: Never Smoker  . Smokeless  tobacco: Never Used  Substance Use Topics  . Alcohol use: No  . Drug use: No     Allergies   Penicillins   Review of Systems Review of Systems  Constitutional: Negative for fever.  Respiratory: Negative for shortness of breath.   Cardiovascular: Negative for chest pain.  Gastrointestinal: Negative for abdominal pain, nausea and vomiting.  Genitourinary: Positive for flank pain. Negative for dysuria.  Neurological: Negative for headaches.  All other systems reviewed and are negative.    Physical Exam Updated Vital Signs BP (!) 154/85   Pulse 91   Temp 98.4 F (36.9 C) (Oral)   Resp 16   Ht 5' 5.5" (1.664 m)   Wt 104.3 kg   SpO2 96%   BMI 37.69 kg/m   Physical Exam Vitals signs and nursing note reviewed.  Constitutional:      Appearance: Normal appearance. She is obese.  HENT:     Head: Normocephalic and atraumatic.     Nose: Nose normal.     Mouth/Throat:     Mouth: Mucous membranes are moist.     Pharynx: Oropharynx is clear.  Eyes:     Conjunctiva/sclera: Conjunctivae normal.     Pupils: Pupils are equal, round, and reactive to light.  Neck:     Musculoskeletal: Normal range of motion and neck supple.  Cardiovascular:     Rate and Rhythm: Normal rate and regular rhythm.     Pulses: Normal pulses.     Heart sounds: Normal heart sounds.  Pulmonary:     Effort: Pulmonary effort is normal.     Breath sounds: Normal breath sounds. No wheezing.  Abdominal:     General: Abdomen is flat. Bowel sounds are normal.     Tenderness: There is no abdominal tenderness. There is no guarding or rebound.  Musculoskeletal: Normal range of motion.  Skin:    General: Skin is warm and dry.     Capillary Refill: Capillary refill takes less than 2 seconds.  Neurological:     General: No focal deficit present.     Mental Status: She is alert and oriented to person, place, and time.  Psychiatric:        Mood and Affect: Mood normal.        Behavior: Behavior normal.       ED Treatments / Results  Labs (all labs ordered are listed, but only abnormal results are displayed) Labs Reviewed - No data to display  EKG None  Radiology No results found.  Procedures Procedures (including critical care time)  Medications Ordered in ED meds given during downtime   Advised she must follow up with  [facility] as they will be the physicians to provide definitive care for this issue.    Final Clinical Impressions(s) / ED Diagnoses   Final diagnoses:  Nephrolithiasis   Return for intractable cough, coughing up blood,fevers >100.4 unrelieved by medication, shortness of breath, intractable vomiting, chest pain, shortness of breath, weakness,numbness, changes in speech, facial asymmetry,abdominal pain, passing out,Inability to tolerate liquids or food, cough, altered mental status or any concerns. No signs of systemic illness or infection. The patient is nontoxic-appearing on exam and vital signs are within normal limits.   I have reviewed the triage vital signs and the nursing notes. Pertinent labs &imaging results that were available during my care of the patient were reviewed by me and considered in my medical decision making (see chart for details).  After history, exam, and medical workup I feel the patient has been appropriately medically screened and is safe for discharge home. Pertinent diagnoses were discussed with the patient. Patient was given return precautions.   Tametra Ahart, MD 08/12/18 [phone]

## 2019-02-02 DIAGNOSIS — E669 Obesity, unspecified: Secondary | ICD-10-CM | POA: Insufficient documentation

## 2019-02-02 DIAGNOSIS — E1169 Type 2 diabetes mellitus with other specified complication: Secondary | ICD-10-CM | POA: Insufficient documentation

## 2019-02-11 ENCOUNTER — Other Ambulatory Visit: Payer: Self-pay | Admitting: Surgical Oncology

## 2019-02-11 DIAGNOSIS — K219 Gastro-esophageal reflux disease without esophagitis: Secondary | ICD-10-CM

## 2020-04-15 IMAGING — CT CT RENAL STONE PROTOCOL
2 of 4 series · 16 of 46 positions shown, 18 images · non-contrast
Comparison: 08/06/2018

CLINICAL DATA: Periodic rlq and rt lateral abd pain x 6 days, pain
increased tonight, known 3mm calculus beyond rt upj w/ mild
hydronephrosis, waiting x 2 months to see urologist

EXAM:
CT ABDOMEN AND PELVIS WITHOUT CONTRAST
TECHNIQUE: Multidetector CT imaging of the abdomen and pelvis was performed
following the standard protocol without IV contrast.

[Series 2: axial st · axial · 0.82mm/px · z∈[-476,-71]mm · 13 of 89 slices shown, 15 images]
[im 4/89  soft-tissue]
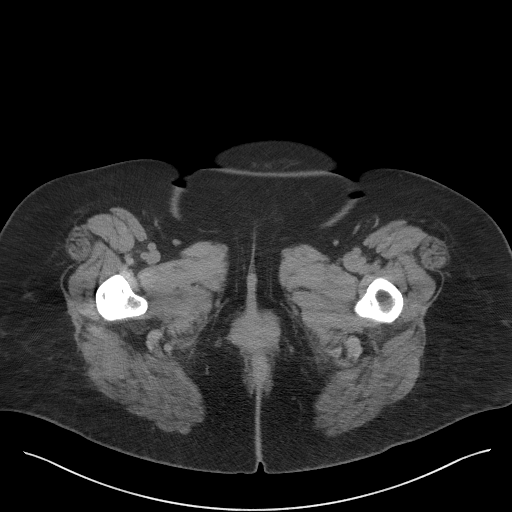
[im 4/89  bone]
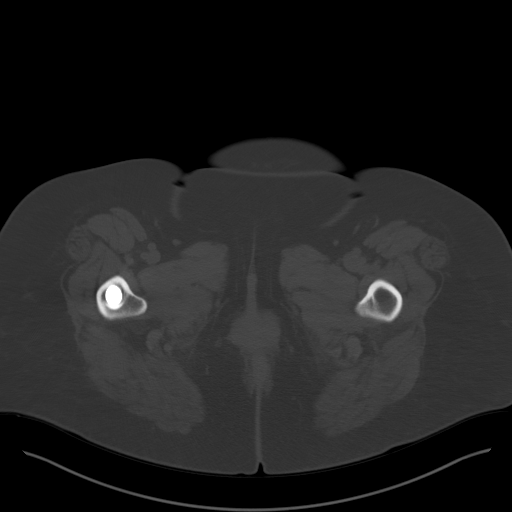
[im 12/89  soft-tissue]
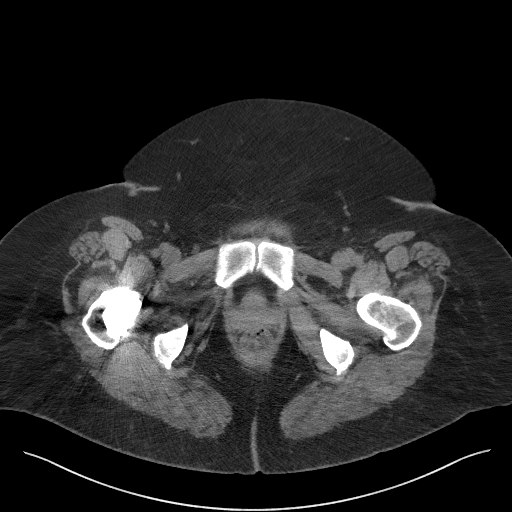
[im 20/89  soft-tissue]
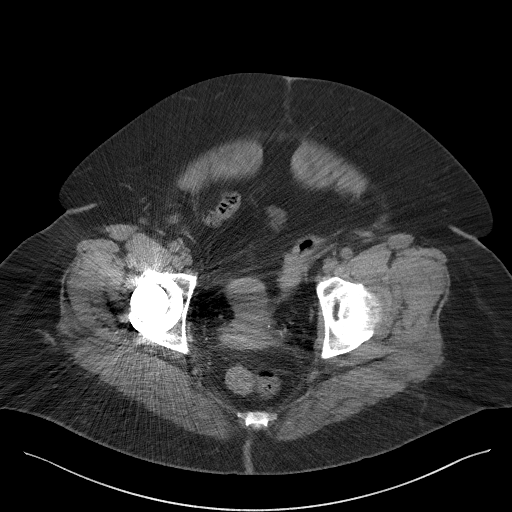
[im 23/89  soft-tissue]
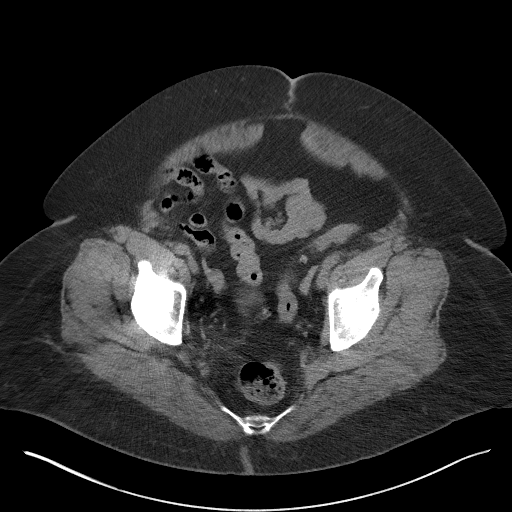
[im 31/89  soft-tissue]
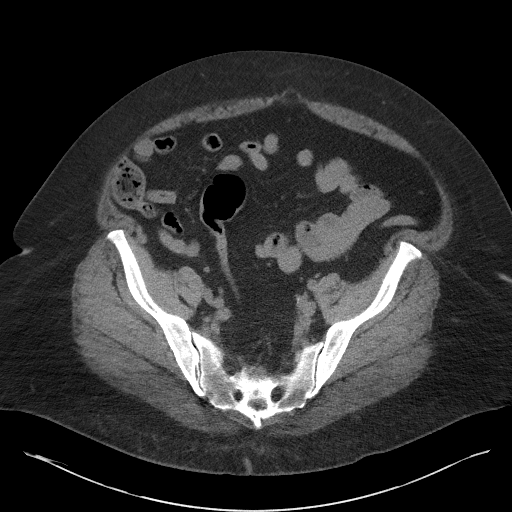
[im 39/89  soft-tissue]
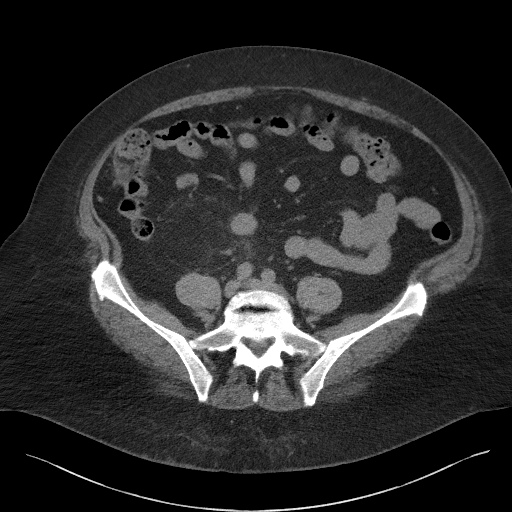
[im 46/89  soft-tissue]
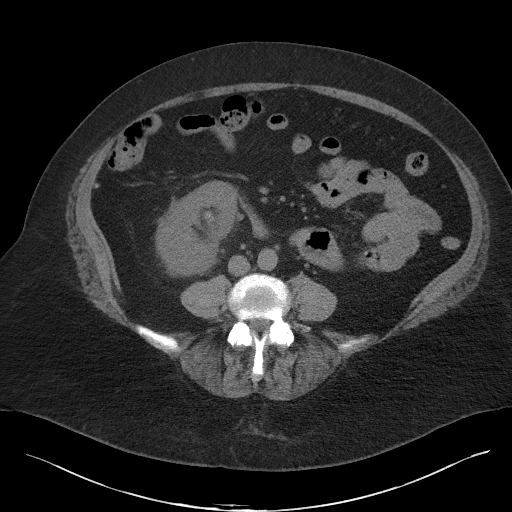
[im 50/89  soft-tissue]
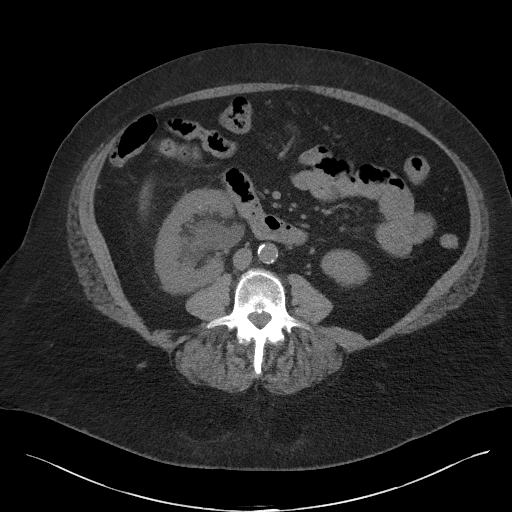
[im 58/89  soft-tissue]
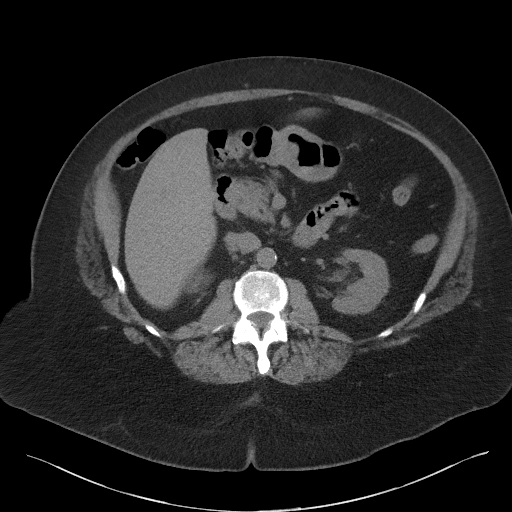
[im 58/89  bone]
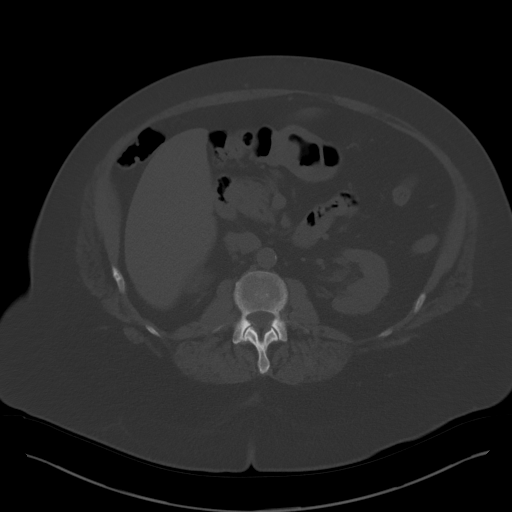
[im 66/89  soft-tissue]
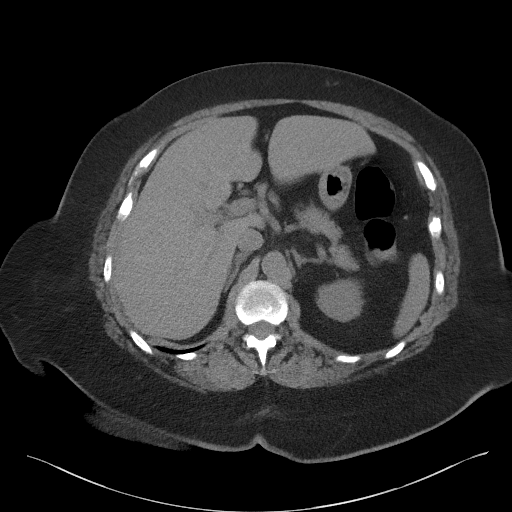
[im 69/89  soft-tissue]
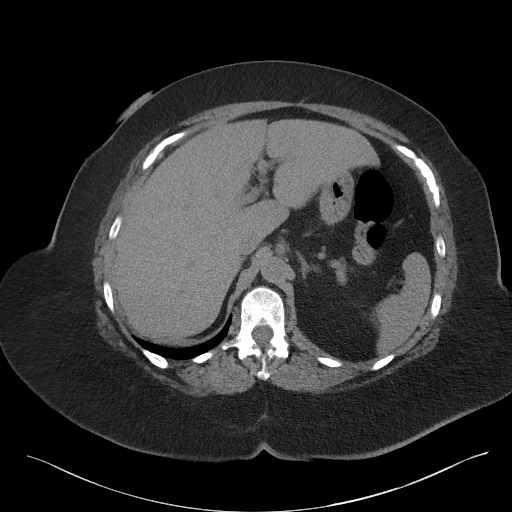
[im 77/89  soft-tissue]
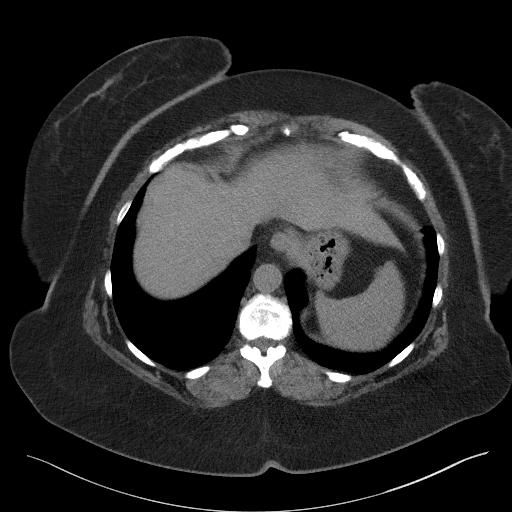
[im 85/89  soft-tissue]
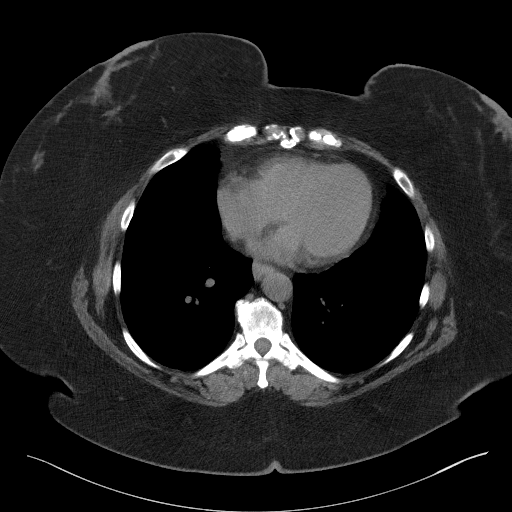

[Series 5: coronal st · coronal · 0.90mm/px · 3 of 97 slices shown]
[im 33/97  soft-tissue]
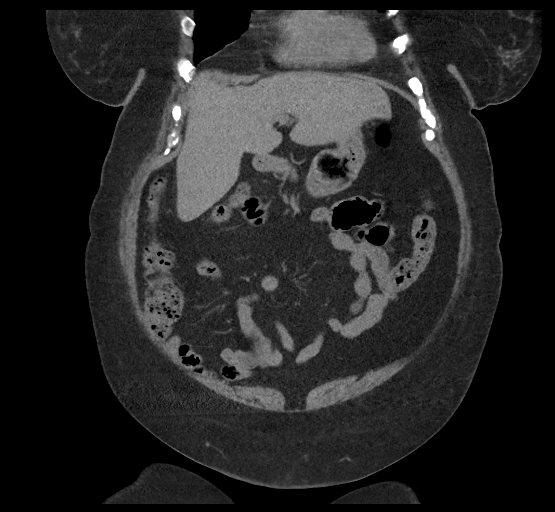
[im 43/97  soft-tissue]
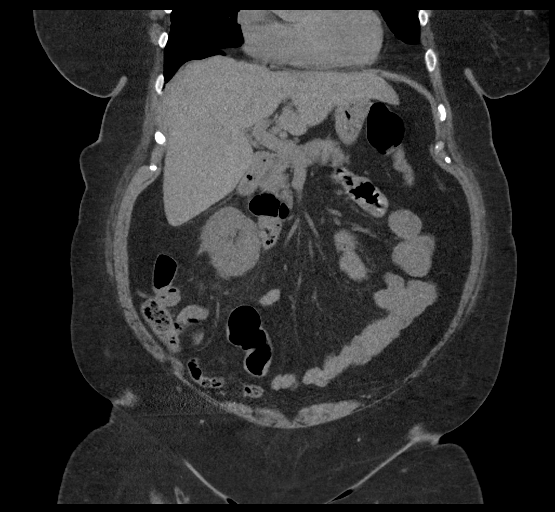
[im 54/97  soft-tissue]
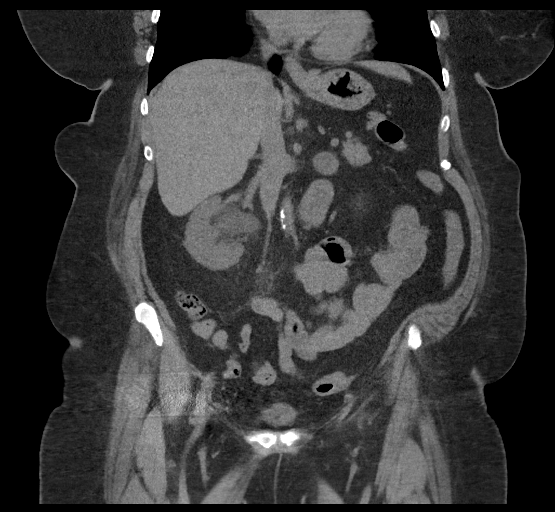

[16 of 46 positions shown; findings below may reference images not displayed]

FINDINGS: Lower chest: Clear lung bases.  Heart normal in size.

Hepatobiliary: No focal liver abnormality is seen. Status post
cholecystectomy. No biliary dilatation.

Pancreas: Unremarkable. No pancreatic ductal dilatation or
surrounding inflammatory changes.

Spleen: Normal in size without focal abnormality.

Adrenals/Urinary Tract: 2 cm hypoattenuating left adrenal mass
consistent with an adenoma and stable from the recent prior CT, as
well as similar to a CT dated 09/01/2008. Normal right adrenal
gland.

Moderate right hydronephrosis with hazy inflammation in the renal
sinus and perinephric fat. This is due to a 3 mm stone in the
proximal right ureter. Ureter below this stone is decompressed with
no additional stones. Small nonobstructing stones noted in the upper
lower poles of the right kidney. Are small nonobstructing stones in
the left kidney with small renal sinus cysts. No other renal masses.
No left hydronephrosis. Normal left ureter.

Bladder is unremarkable.

Stomach/Bowel: Stomach is within normal limits. Appendix appears
normal. No evidence of bowel wall thickening, distention, or
inflammatory changes.

Vascular/Lymphatic: Aortic atherosclerosis. No enlarged abdominal or
pelvic lymph nodes.

Reproductive: Uterus and bilateral adnexa are unremarkable.

Other: No abdominal wall hernia or abnormality. No abdominopelvic
ascites.

Musculoskeletal: Well-positioned total right hip arthroplasty. No
fracture or acute finding. No osteoblastic or osteolytic lesions.
IMPRESSION: 1. Moderate right hydronephrosis with renal sinus and perinephric
fat stranding. Hydronephrosis has mildly increased from the recent
prior exam. The 3 mm stone in the proximal right ureter is unchanged
in position.
2. Stable bilateral nonobstructing intrarenal stones. No other
ureteral stones. No other acute abnormality.
3. Left adrenal adenoma.
4. Aortic atherosclerosis.

## 2020-08-30 ENCOUNTER — Encounter: Payer: Self-pay | Admitting: Dietician

## 2020-08-30 ENCOUNTER — Other Ambulatory Visit: Payer: Self-pay

## 2020-08-30 ENCOUNTER — Encounter: Payer: Medicare Other | Attending: Internal Medicine | Admitting: Dietician

## 2020-08-30 DIAGNOSIS — E119 Type 2 diabetes mellitus without complications: Secondary | ICD-10-CM | POA: Diagnosis not present

## 2020-08-30 NOTE — Patient Instructions (Addendum)
Take your metformin after a meal on a full stomach.  Work towards eating three meals a day, about 5-6 hours apart!  Begin to recognize carbohydrates in your food choices!  Have 3 carb choices at each meal (45 g).   Begin to build your meals using the proportions of the Balanced Plate. . First, select your carb choice(s) for the meal, and determine how much you should have to equal 3 carb choices (45 g). . Next, select your source of protein to pair with your carb choice(s). . Finally, complete the remaining half of your meal with a variety of non-starchy vegetables.  Consider Harvest Snaps for a good snack with a crunch.

## 2020-08-30 NOTE — Progress Notes (Signed)
Diabetes Self-Management Education  Visit Type: First/Initial  Appt. Start Time: 1515 Appt. End Time: 1620  08/30/2020  Ms. Olivia Walker, identified by name and date of birth, is a 70 y.o. female with a diagnosis of Diabetes: Type 2.   ASSESSMENT Pt friend Noreene Larsson is present during the appointment.  Pt reports arthritis pain in their knees and need knee replacements. Pt reports being a candidate for knee replacement surgery. Takes prednisone for the knee pain, course of 4-5 days, then a break for about a month. Pt reports a bad bout of kidney stones recently, states they had hematuria. Pt has had 3 instances of kidney stones in the last 12 years. Pt reports diarrhea from metformin, was changed to extended release to help but it has not changed. Pt also reports frequent diarrhea after meals. Pt reports not sleeping well at all, sometimes sleeps less than an hour a night. Pt reports cooking more at home since the pandemic, lives by themselves. Pt reports trying to keep undesirable choices out of their house to avoid eating them. Pt checks FBG once a week, usually sees 110-120. Pt reports cantaloupe, pineapple, bananas, cucumubers, raw onions give them an upset stomach, and beans give them gas. Pt describes the stomach pain as their stomach is on fire. Pt reports being a grazer, eats cheez it, pecan spinwheels, nutrigrain bars.  Pt reports drinking 4-5 bottles of water a day.   There were no vitals taken for this visit. There is no height or weight on file to calculate BMI.   Diabetes Self-Management Education - 08/30/20 1511      Visit Information   Visit Type First/Initial      Initial Visit   Diabetes Type Type 2    Are you currently following a meal plan? No    Are you taking your medications as prescribed? Yes    Date Diagnosed 3 years      Health Coping   How would you rate your overall health? Fair      Psychosocial Assessment   Patient Belief/Attitude about Diabetes Other  (comment)   "just a fact of life"   Self-care barriers None;Unsteady gait/risk for falls   needs knee replacements   Self-management support Doctor's office;Family;Friends    Other persons present Patient;Friend    Patient Concerns Nutrition/Meal planning;Healthy Lifestyle    Special Needs None    Preferred Learning Style No preference indicated    Learning Readiness Ready    How often do you need to have someone help you when you read instructions, pamphlets, or other written materials from your doctor or pharmacy? 1 - Never    What is the last grade level you completed in school? 12      Pre-Education Assessment   Patient understands the diabetes disease and treatment process. Needs Instruction    Patient understands incorporating nutritional management into lifestyle. Needs Instruction    Patient undertands incorporating physical activity into lifestyle. Needs Instruction    Patient understands using medications safely. Needs Instruction    Patient understands monitoring blood glucose, interpreting and using results Needs Instruction    Patient understands prevention, detection, and treatment of acute complications. Needs Instruction    Patient understands prevention, detection, and treatment of chronic complications. Needs Instruction    Patient understands how to develop strategies to address psychosocial issues. Needs Instruction    Patient understands how to develop strategies to promote health/change behavior. Needs Instruction      Complications   Last HgB A1C  per patient/outside source 8.7 %   07/10/2020   How often do you check your blood sugar? 1-2 times/day    Fasting Blood glucose range (mg/dL) 79-024    Have you had a dilated eye exam in the past 12 months? Yes    Have you had a dental exam in the past 12 months? No    Are you checking your feet? Yes    How many days per week are you checking your feet? 7      Dietary Intake   Breakfast brown sugar and cinnamon oatmeal  with black berries and strawberries    Snack (morning) none    Lunch open faced [place] sandwich    Snack (afternoon) none    Dinner Hot n spicy honey chicken, egg roll, half sweet tea    Snack (evening) none    Beverage(s) water, half sweet tea      Exercise   Exercise Type ADL's    How many days per week to you exercise? 0    How many minutes per day do you exercise? 0    Total minutes per week of exercise 0      Patient Education   Disease state  Definition of diabetes, type 1 and 2, and the diagnosis of diabetes;Factors that contribute to the development of diabetes    Nutrition management  Role of diet in the treatment of diabetes and the relationship between the three main macronutrients and blood glucose level;Carbohydrate counting;Food label reading, portion sizes and measuring food.    Physical activity and exercise  Role of exercise on diabetes management, blood pressure control and cardiac health.    Medications Reviewed patients medication for diabetes, action, purpose, timing of dose and side effects.    Chronic complications Nephropathy, what it is, prevention of, the use of ACE, ARB's and early detection of through urine microalbumia.;Assessed and discussed foot care and prevention of foot problems    Psychosocial adjustment Role of stress on diabetes;Identified and addressed patients feelings and concerns about diabetes      Individualized Goals (developed by patient)   Nutrition Follow meal plan discussed    Physical Activity Not Applicable    Medications take my medication as prescribed    Monitoring  test my blood glucose as discussed      Post-Education Assessment   Patient understands the diabetes disease and treatment process. Needs Review    Patient understands incorporating nutritional management into lifestyle. Needs Review    Patient undertands incorporating physical activity into lifestyle. Needs Review    Patient understands using medications safely. Needs  Review    Patient understands monitoring blood glucose, interpreting and using results Needs Review    Patient understands prevention, detection, and treatment of acute complications. Needs Review    Patient understands prevention, detection, and treatment of chronic complications. Needs Review    Patient understands how to develop strategies to address psychosocial issues. Needs Review    Patient understands how to develop strategies to promote health/change behavior. Needs Review      Outcomes   Expected Outcomes Demonstrated limited interest in learning.  Expect minimal changes    Future DMSE PRN    Program Status Completed           Individualized Plan for Diabetes Self-Management Training:   Learning Objective:  Patient will have a greater understanding of diabetes self-management. Patient education plan is to attend individual and/or group sessions per assessed needs and concerns.   Plan:  Patient Instructions  Take your metformin after a meal on a full stomach.  Work towards eating three meals a day, about 5-6 hours apart!  Begin to recognize carbohydrates in your food choices!  Have 3 carb choices at each meal (45 g).   Begin to build your meals using the proportions of the Balanced Plate. . First, select your carb choice(s) for the meal, and determine how much you should have to equal 3 carb choices (45 g). . Next, select your source of protein to pair with your carb choice(s). . Finally, complete the remaining half of your meal with a variety of non-starchy vegetables.  Consider Harvest Snaps for a good snack with a crunch.    Expected Outcomes:  Demonstrated limited interest in learning.  Expect minimal changes  Education material provided: Food label handouts, Meal plan card and My Plate  If problems or questions, patient to contact team via:  Phone and Email  Future DSME appointment: PRN

## 2021-01-29 ENCOUNTER — Other Ambulatory Visit: Payer: Self-pay | Admitting: Internal Medicine

## 2021-01-29 DIAGNOSIS — Z1231 Encounter for screening mammogram for malignant neoplasm of breast: Secondary | ICD-10-CM

## 2021-02-14 ENCOUNTER — Ambulatory Visit: Payer: Medicare Other

## 2021-03-06 ENCOUNTER — Ambulatory Visit
Admission: RE | Admit: 2021-03-06 | Discharge: 2021-03-06 | Disposition: A | Discharge status: Home / Self Care | Payer: Medicare Other | Source: Ambulatory Visit | Attending: Internal Medicine | Admitting: Internal Medicine

## 2021-03-06 ENCOUNTER — Other Ambulatory Visit: Payer: Self-pay

## 2021-03-06 DIAGNOSIS — Z1231 Encounter for screening mammogram for malignant neoplasm of breast: Secondary | ICD-10-CM

## 2021-05-29 ENCOUNTER — Other Ambulatory Visit: Payer: Self-pay

## 2021-05-29 ENCOUNTER — Encounter (HOSPITAL_BASED_OUTPATIENT_CLINIC_OR_DEPARTMENT_OTHER): Payer: Self-pay | Admitting: Emergency Medicine

## 2021-05-29 ENCOUNTER — Emergency Department (HOSPITAL_BASED_OUTPATIENT_CLINIC_OR_DEPARTMENT_OTHER): Payer: Medicare Other

## 2021-05-29 ENCOUNTER — Emergency Department (HOSPITAL_BASED_OUTPATIENT_CLINIC_OR_DEPARTMENT_OTHER)
Admission: EM | Admit: 2021-05-29 | Discharge: 2021-05-29 | Disposition: A | Discharge status: Home / Self Care | Payer: Medicare Other | Attending: Emergency Medicine | Admitting: Emergency Medicine

## 2021-05-29 DIAGNOSIS — R10A1 Flank pain, right side: Secondary | ICD-10-CM

## 2021-05-29 DIAGNOSIS — R109 Unspecified abdominal pain: Secondary | ICD-10-CM

## 2021-05-29 DIAGNOSIS — N390 Urinary tract infection, site not specified: Secondary | ICD-10-CM | POA: Diagnosis not present

## 2021-05-29 DIAGNOSIS — Z79899 Other long term (current) drug therapy: Secondary | ICD-10-CM | POA: Diagnosis not present

## 2021-05-29 LAB — COMPREHENSIVE METABOLIC PANEL
ALT: 16 U/L (ref 0–44)
AST: 18 U/L (ref 15–41)
Albumin: 3.4 g/dL — ABNORMAL LOW (ref 3.5–5.0)
Alkaline Phosphatase: 59 U/L (ref 38–126)
Anion gap: 11 (ref 5–15)
BUN: 13 mg/dL (ref 8–23)
CO2: 23 mmol/L (ref 22–32)
Calcium: 8.8 mg/dL — ABNORMAL LOW (ref 8.9–10.3)
Chloride: 103 mmol/L (ref 98–111)
Creatinine, Ser: 0.94 mg/dL (ref 0.44–1.00)
GFR, Estimated: >60 mL/min (ref >60)
Glucose, Bld: 243 mg/dL — ABNORMAL HIGH (ref 70–99)
Potassium: 3.8 mmol/L (ref 3.5–5.1)
Sodium: 137 mmol/L (ref 135–145)
Total Bilirubin: 0.5 mg/dL (ref 0.3–1.2)
Total Protein: 7 g/dL (ref 6.5–8.1)

## 2021-05-29 LAB — CBC WITH DIFFERENTIAL/PLATELET
Abs Immature Granulocytes: 0.03 10*3/uL (ref 0.00–0.07)
Basophils Absolute: 0 10*3/uL (ref 0.0–0.1)
Basophils Relative: 0 %
Eosinophils Absolute: 0.2 10*3/uL (ref 0.0–0.5)
Eosinophils Relative: 2 %
HCT: 35 % — ABNORMAL LOW (ref 36.0–46.0)
Hemoglobin: 11.1 g/dL — ABNORMAL LOW (ref 12.0–15.0)
Immature Granulocytes: 0 %
Lymphocytes Relative: 21 %
Lymphs Abs: 1.9 10*3/uL (ref 0.7–4.0)
MCH: 26.7 pg (ref 26.0–34.0)
MCHC: 31.7 g/dL (ref 30.0–36.0)
MCV: 84.1 fL (ref 80.0–100.0)
Monocytes Absolute: 0.6 10*3/uL (ref 0.1–1.0)
Monocytes Relative: 7 %
Neutro Abs: 6.4 10*3/uL (ref 1.7–7.7)
Neutrophils Relative %: 70 %
Platelets: 267 10*3/uL (ref 150–400)
RBC: 4.16 MIL/uL (ref 3.87–5.11)
RDW: 14.1 % (ref 11.5–15.5)
WBC: 9.3 10*3/uL (ref 4.0–10.5)
nRBC: 0 % (ref 0.0–0.2)

## 2021-05-29 LAB — URINALYSIS, ROUTINE W REFLEX MICROSCOPIC
Glucose, UA: 250 mg/dL — AB
Hgb urine dipstick: NEGATIVE
Ketones, ur: 15 mg/dL — AB
Nitrite: NEGATIVE
Protein, ur: 30 mg/dL — AB
Specific Gravity, Urine: >=1.03 (ref 1.005–1.030)
pH: 5.5 (ref 5.0–8.0)

## 2021-05-29 LAB — URINALYSIS, MICROSCOPIC (REFLEX): WBC, UA: >50 WBC/hpf (ref 0–5)

## 2021-05-29 MED ORDER — CEPHALEXIN 500 MG PO CAPS: 500.0000 mg | ORAL_CAPSULE | Freq: Two times a day (BID) | ORAL | 0 refills | Status: DC

## 2021-05-29 MED ORDER — CEPHALEXIN 250 MG PO CAPS
500.0000 mg | ORAL_CAPSULE | Freq: Once | ORAL | Status: AC
  Administered 2021-05-29: 500 mg via ORAL

## 2021-05-29 MED ORDER — MORPHINE SULFATE (PF) 4 MG/ML IV SOLN
4.0000 mg | Freq: Once | INTRAVENOUS | Status: AC
  Administered 2021-05-29: 4 mg via INTRAVENOUS

## 2021-05-29 MED ORDER — VALACYCLOVIR HCL 1 G PO TABS: 1000.0000 mg | ORAL_TABLET | Freq: Three times a day (TID) | ORAL | 0 refills | Status: DC

## 2021-05-29 NOTE — ED Provider Notes (Signed)
[facility] EMERGENCY DEPARTMENT Provider Note   CSN: [id number] Arrival date & time: 05/29/21  0208     History  Chief Complaint  Patient presents with   Flank Pain    Olivia Walker is a 71 y.o. female.  The history is provided by the patient and medical records.  Flank Pain [name] is a 71 y.o. female who presents to the Emergency Department complaining of flank pain.  She presents tot he ED for evaluation of right flank pain that started around noon.  Pain is described as miserable, cannot get comfortable.  Pain feels like prior kidney stones.  No fever, dysuria.  Took zofran and toradol 10 mg at 1630 and had some improvement in her sxs.  Took a second toradol 0030 without significant improvement in sxs.  She tried a heating pad without improvement in her symptoms.  Sees Dr. Logan Bores with Urology.       Home Medications Prior to Admission medications   Medication Sig Start Date End Date Taking? Authorizing Provider  cephALEXin (KEFLEX) 500 MG capsule Take 1 capsule (500 mg total) by mouth 2 (two) times daily. 05/29/21  Yes Tilden Fossa, MD  valACYclovir (VALTREX) 1000 MG tablet Take 1 tablet (1,000 mg total) by mouth 3 (three) times daily. 05/29/21  Yes Tilden Fossa, MD  ALPRAZolam Prudy Feeler) 0.5 MG tablet Take 0.5 mg by mouth at bedtime as needed. Anxiety/sleep    [provider]  ATORVASTATIN CALCIUM PO Take by mouth.    [provider]  ciprofloxacin (CIPRO) 500 MG tablet Take 1 tablet (500 mg total) by mouth 2 (two) times daily. 01/05/18   Terrilee Files, MD  Diclofenac Sodium CR 100 MG 24 hr tablet Take 1 tablet (100 mg total) by mouth daily. 08/12/18   Palumbo, April, MD  FLUoxetine (PROZAC) 20 MG capsule Take 20 mg by mouth daily before breakfast.     [provider]  FLUoxetine (PROZAC) 40 MG capsule Take 40 mg by mouth daily.    [provider]  Meloxicam (MOBIC PO) Take by mouth.    [provider]  METFORMIN  HCL PO Take by mouth.    [provider]  methylphenidate (RITALIN) 10 MG tablet Take 10 mg by mouth 2 (two) times daily as needed. 1/2 tablet daily    [provider]  ondansetron (ZOFRAN ODT) 4 MG disintegrating tablet Take 1 tablet (4 mg total) by mouth every 8 (eight) hours as needed for nausea or vomiting. 08/06/18   Gwyneth Sprout, MD  ondansetron (ZOFRAN ODT) 8 MG disintegrating tablet 8mg  ODT q8 hours prn nausea 08/12/18   Palumbo, April, MD  oxyCODONE-acetaminophen (PERCOCET) 5-325 MG tablet Take 1 tablet by mouth every 6 (six) hours as needed. 08/12/18   Palumbo, April, MD  oxyCODONE-acetaminophen (PERCOCET/ROXICET) 5-325 MG tablet Take 1-2 tablets by mouth every 6 (six) hours as needed for moderate pain or severe pain. 08/06/18   08/08/18, MD  predniSONE (DELTASONE) 20 MG tablet Take 2 tablets (40 mg total) by mouth daily. 06/16/16   06/18/16, MD  tamsulosin (FLOMAX) 0.4 MG CAPS capsule Take 1 capsule (0.4 mg total) by mouth daily. 08/12/18   Palumbo, April, MD      Allergies    Penicillins    Review of Systems   Review of Systems  Genitourinary:  Positive for flank pain.  All other systems reviewed and are negative.  Physical Exam Updated Vital Signs BP (!) 148/88    Pulse  95    Temp 99.2 F (37.3 C) (Oral)    Resp 18    Ht 5\' 5"  (1.651 m)    Wt 103 kg    SpO2 96%    BMI 37.77 kg/m  Physical Exam Vitals and nursing note reviewed.  Constitutional:      Appearance: She is well-developed.  HENT:     Head: Normocephalic and atraumatic.  Cardiovascular:     Rate and Rhythm: Normal rate and regular rhythm.     Heart sounds: No murmur heard. Pulmonary:     Effort: Pulmonary effort is normal. No respiratory distress.     Breath sounds: Normal breath sounds.  Abdominal:     Palpations: Abdomen is soft.     Tenderness: There is no abdominal tenderness. There is no guarding or rebound.     Comments: There is no CVA tenderness to palpation.   There are erythematous patches to the right lateral lower back  Musculoskeletal:        General: No swelling or tenderness.     Comments: 2+ DP pulses bilaterally  Skin:    General: Skin is warm and dry.  Neurological:     Mental Status: She is alert and oriented to person, place, and time.  Psychiatric:        Behavior: Behavior normal.    ED Results / Procedures / Treatments   Labs (all labs ordered are listed, but only abnormal results are displayed) Labs Reviewed  COMPREHENSIVE METABOLIC PANEL - Abnormal; Notable for the following components:      Result Value   Glucose, Bld 243 (*)    Calcium 8.8 (*)    Albumin 3.4 (*)    All other components within normal limits  CBC WITH DIFFERENTIAL/PLATELET - Abnormal; Notable for the following components:   Hemoglobin 11.1 (*)    HCT 35.0 (*)    All other components within normal limits  URINALYSIS, ROUTINE W REFLEX MICROSCOPIC - Abnormal; Notable for the following components:   APPearance CLOUDY (*)    Glucose, UA 250 (*)    Bilirubin Urine SMALL (*)    Ketones, ur 15 (*)    Protein, ur 30 (*)    Leukocytes,Ua SMALL (*)    All other components within normal limits  URINALYSIS, MICROSCOPIC (REFLEX) - Abnormal; Notable for the following components:   Bacteria, UA FEW (*)    All other components within normal limits  URINE CULTURE    EKG None  Radiology CT Renal Stone Study  Result Date: 05/29/2021 CLINICAL DATA:  Flank pain, kidney stone suspected. Right flank pain. Nephrolithiasis. EXAM: CT ABDOMEN AND PELVIS WITHOUT CONTRAST TECHNIQUE: Multidetector CT imaging of the abdomen and pelvis was performed following the standard protocol without IV contrast. COMPARISON:  08/12/2018 FINDINGS: Lower chest: No acute abnormality. Hepatobiliary: No focal liver abnormality is seen. Status post cholecystectomy. No biliary dilatation. Pancreas: Unremarkable Spleen: Unremarkable Adrenals/Urinary Tract: The adrenal glands are unremarkable. The  kidneys are normal in size and position. Bilateral nonobstructing nephrolithiasis is present with multiple small calculi measuring up to 3 mm in diameter. No hydronephrosis. No ureteral calculi. Parapelvic cyst noted within the upper pole the left kidney, unchanged. The bladder is unremarkable. Stomach/Bowel: Stomach is within normal limits. Appendix appears normal. No evidence of bowel wall thickening, distention, or inflammatory changes. No free intraperitoneal fluid. Vascular/Lymphatic: Aortic atherosclerosis. No enlarged abdominal or pelvic lymph nodes. Reproductive: Uterus and bilateral adnexa are unremarkable. Other: Tiny fat containing umbilical hernia Musculoskeletal: Right total hip arthroplasty  has been performed. Degenerative changes are seen within the lumbar spine. No lytic or blastic bone lesion. IMPRESSION: No acute intra-abdominal pathology identified. Mild bilateral nonobstructing nephrolithiasis. No hydronephrosis. No urolithiasis. Aortic Atherosclerosis (ICD10-I70.0). Electronically Signed   By: Helyn Numbers M.D.   On: 05/29/2021 04:09    Procedures Procedures    Medications Ordered in ED Medications  morphine 4 MG/ML injection 4 mg (4 mg Intravenous Given 05/29/21 0351)  cephALEXin (KEFLEX) capsule 500 mg (500 mg Oral Given 05/29/21 1779)    ED Course/ Medical Decision Making/ A&P                           Medical Decision Making  Patient with history of kidney stones here for evaluation of flank pain, similar to her prior kidney stones.  CT scan is negative for acute obstructing stones.  BMP with normal renal function, hyperglycemia.  CBC with stable anemia.  UA is concerning for urinary tract infection with numerous WBCs, clumped WBCs and rare bacteria.  Will start on antibiotics due to her flank pain.  She does have some erythematous patches to the right flank.  There are no discrete vesicles at this time and she did have a heating pad in the area.  There is concerned that her  pain may truly be related to shingles although it is difficult to distinguish if she has local redness persistent from her earlier heating pad use.  Would prefer to treat for shingles at this time due to better outcomes with earlier treatment.  She was treated with morphine for pain in the emergency department with improvement in her symptoms.  Discussed with patient findings of studies.  Discussed importance of outpatient follow-up as well as return precautions.        Final Clinical Impression(s) / ED Diagnoses Final diagnoses:  Right flank pain  Acute UTI    Rx / DC Orders ED Discharge Orders          Ordered    valACYclovir (VALTREX) 1000 MG tablet  3 times daily        05/29/21 0508    cephALEXin (KEFLEX) 500 MG capsule  2 times daily        05/29/21 3903              Tilden Fossa, MD 05/29/21 [phone]

## 2021-05-29 NOTE — ED Notes (Signed)
Patient transported to CT 

## 2021-05-29 NOTE — ED Triage Notes (Signed)
Right side pain starting today, has Hx of Kidney stones. Is followed by MD for, took  home meds in attempt to wait for appointment in morning.

## 2021-05-30 LAB — URINE CULTURE

## 2021-07-31 DIAGNOSIS — M79642 Pain in left hand: Secondary | ICD-10-CM | POA: Insufficient documentation

## 2021-08-08 DIAGNOSIS — E669 Obesity, unspecified: Secondary | ICD-10-CM | POA: Insufficient documentation

## 2021-11-28 ENCOUNTER — Ambulatory Visit: Payer: Medicare Other | Admitting: Cardiology

## 2021-11-28 ENCOUNTER — Encounter: Payer: Self-pay | Admitting: Cardiology

## 2021-11-28 VITALS — BP 130/82 | HR 88 | Ht 64.0 in | Wt 215.2 lb

## 2021-11-28 DIAGNOSIS — R0989 Other specified symptoms and signs involving the circulatory and respiratory systems: Secondary | ICD-10-CM

## 2021-11-28 DIAGNOSIS — R9431 Abnormal electrocardiogram [ECG] [EKG]: Secondary | ICD-10-CM | POA: Diagnosis not present

## 2021-11-28 DIAGNOSIS — Z01810 Encounter for preprocedural cardiovascular examination: Secondary | ICD-10-CM

## 2021-11-28 NOTE — Patient Instructions (Signed)
Medication Instructions:  No changes *If you need a refill on your cardiac medications before your next appointment, please call your pharmacy*   Lab Work: None ordered If you have labs (blood work) drawn today and your tests are completely normal, you will receive your results only by: MyChart Message (if you have MyChart) OR A paper copy in the mail If you have any lab test that is abnormal or we need to change your treatment, we will call you to review the results.   Testing/Procedures: Your physician has requested that you have a carotid duplex. This test is an ultrasound of the carotid arteries in your neck. It looks at blood flow through these arteries that supply the brain with blood. Allow one hour for this exam. There are no restrictions or special instructions. This will take place at [address], [address].  Follow-Up: At [facility], you and your health needs are our priority.  As part of our continuing mission to provide you with exceptional heart care, we have created designated Provider Care Teams.  These Care Teams include your primary Cardiologist (physician) and Advanced Practice Providers (APPs -  Physician Assistants and Nurse Practitioners) who all work together to provide you with the care you need, when you need it.  We recommend signing up for the patient portal called "MyChart".  Sign up information is provided on this After Visit Summary.  MyChart is used to connect with patients for Virtual Visits (Telemedicine).  Patients are able to view lab/test results, encounter notes, upcoming appointments, etc.  Non-urgent messages can be sent to your provider as well.   To learn more about what you can do with MyChart, go to [email or web address].    Your next appointment:   Follow up as needed with Dr. Antoine Poche

## 2021-11-28 NOTE — Progress Notes (Signed)
Cardiology Office Note   Date:  11/28/2021   ID:  [name], Walker 1950-09-28, MRN [id number]  PCP:  Burnis Medin, PA-C  Cardiologist:   Rollene Rotunda, MD Referring:    Chief Complaint  Patient presents with   Pre-op Exam      History of Present Illness: Olivia Walker is a 71 y.o. female who is referred by [place], [place], PA-C for evaluation of abnormal EKG.   She was noted to have an arrhythmia on EKG.   I do not actually have a copy of that EKG.  Today she is noted to have some premature atrial contractions.  She has felt these over the years.  She has not had any prior cardiac history.  She has not had any prior cardiac testing.  The patient denies any new symptoms such as chest discomfort, neck or arm discomfort. There has been no new shortness of breath, PND or orthopnea. There have been no reported palpitations, presyncope or syncope.   She is limited in her activities however because of her knee pain.  She can do some household chores and is sweeping.     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    Diabetes mellitus without complication (HCC)    prediabetic   GERD (gastroesophageal reflux disease)    Hemorrhoids    mild   High cholesterol    Impaired glucose tolerance    OA (osteoarthritis)    hips, knees   Obesity    Stones in the urinary tract     Past Surgical History:  Procedure Laterality Date   CHOLECYSTECTOMY  12/30/2011   Procedure: LAPAROSCOPIC CHOLECYSTECTOMY;  Surgeon: Emelia Loron, MD;  Location: [facility] OR;  Service: General;  Laterality: N/A;   HAND SURGERY     rt carpal tunnel 90   kidney stones     KNEE SURGERY Bilateral 01:left,1985:right   torn meniscus lft   KNEE SURGERY  1985   lateral release   NASAL SEPTUM SURGERY     TOTAL HIP ARTHROPLASTY Right 07/07/2012    Procedure: TOTAL HIP ARTHROPLASTY;  Surgeon: Loanne Drilling, MD;  Location: [facility] ORS;  Service: Orthopedics;  Laterality: Right;   TOTAL HIP ARTHROPLASTY  2014     Current Outpatient Medications  Medication Sig Dispense Refill   ALPRAZolam (XANAX) 0.5 MG tablet Take 0.5 mg by mouth at bedtime as needed. Anxiety/sleep     ATORVASTATIN CALCIUM PO Take 10 mg by mouth at bedtime.     FLUoxetine (PROZAC) 40 MG capsule Take 40 mg by mouth daily.     glucose blood (ONETOUCH VERIO) test strip Once daily testing E11.9     JANUVIA 100 MG tablet Take 100 mg by mouth daily.     metFORMIN (GLUCOPHAGE-XR) 750 MG 24 hr tablet Take 750 mg by mouth 2 (two) times daily.     omeprazole (PRILOSEC) 40 MG capsule Take 1 capsule by mouth daily.     tamsulosin (FLOMAX) 0.4 MG CAPS capsule Take 1 capsule (0.4 mg total) by mouth daily. 30 capsule 0   traZODone (DESYREL) 50 MG tablet Take 50 mg by mouth at bedtime.     cephALEXin (KEFLEX) 500 MG capsule Take 1 capsule (500 mg total) by mouth 2 (two) times daily. (Patient  not taking: Reported on 11/28/2021) 14 capsule 0   ciprofloxacin (CIPRO) 500 MG tablet Take 1 tablet (500 mg total) by mouth 2 (two) times daily. (Patient not taking: Reported on 11/28/2021) 14 tablet 0   Diclofenac Sodium CR 100 MG 24 hr tablet Take 1 tablet (100 mg total) by mouth daily. (Patient not taking: Reported on 11/28/2021) 10 tablet 0   FLUoxetine (PROZAC) 20 MG capsule Take 20 mg by mouth daily before breakfast.  (Patient not taking: Reported on 11/28/2021)     Meloxicam (MOBIC PO) Take by mouth. (Patient not taking: Reported on 11/28/2021)     METFORMIN HCL PO Take by mouth. (Patient not taking: Reported on 11/28/2021)     methylphenidate (RITALIN) 10 MG tablet Take 10 mg by mouth 2 (two) times daily as needed. 1/2 tablet daily (Patient not taking: Reported on 11/28/2021)     ondansetron (ZOFRAN ODT) 4 MG disintegrating tablet Take 1 tablet (4 mg total) by mouth every 8 (eight) hours as needed for nausea  or vomiting. (Patient not taking: Reported on 11/28/2021) 20 tablet 0   ondansetron (ZOFRAN ODT) 8 MG disintegrating tablet 8mg  ODT q8 hours prn nausea (Patient not taking: Reported on 11/28/2021) 6 tablet 0   oxyCODONE-acetaminophen (PERCOCET) 5-325 MG tablet Take 1 tablet by mouth every 6 (six) hours as needed. (Patient not taking: Reported on 11/28/2021) 5 tablet 0   oxyCODONE-acetaminophen (PERCOCET/ROXICET) 5-325 MG tablet Take 1-2 tablets by mouth every 6 (six) hours as needed for moderate pain or severe pain. (Patient not taking: Reported on 11/28/2021) 15 tablet 0   predniSONE (DELTASONE) 20 MG tablet Take 2 tablets (40 mg total) by mouth daily. (Patient not taking: Reported on 11/28/2021) 10 tablet 0   triamcinolone ointment (KENALOG) 0.5 % daily as needed. (Patient not taking: Reported on 11/28/2021)     valACYclovir (VALTREX) 1000 MG tablet Take 1 tablet (1,000 mg total) by mouth 3 (three) times daily. (Patient not taking: Reported on 11/28/2021) 21 tablet 0   No current facility-administered medications for this visit.    Allergies:   Penicillins    Social History:  The patient  reports that she has never smoked. She has never used smokeless tobacco. She reports that she does not drink alcohol and does not use drugs.   Family History:  The patient's family history includes Diabetes in her mother; Heart Problems in her mother.   She does not know her father's family history.  She was adopted but she did know her birth mother who had later onset bypass in her 15s.   ROS:  Please see the history of present illness.   Otherwise, review of systems are positive for none.   All other systems are reviewed and negative.    PHYSICAL EXAM: VS:  BP 130/82 (BP Location: Left Arm, Patient Position: Sitting, Cuff Size: Large)   Pulse 88   Ht 5\' 4"  (1.626 m)   Wt 215 lb 3.2 oz (97.6 kg)   SpO2 95%   BMI 36.94 kg/m  , BMI Body mass index is 36.94 kg/m. GENERAL:  Well appearing HEENT:  Pupils equal round  and reactive, fundi not visualized, oral mucosa unremarkable NECK:  No jugular venous distention, waveform within normal limits, carotid upstroke brisk and symmetric, right  bruit, no thyromegaly LYMPHATICS:  No cervical, inguinal adenopathy LUNGS:  Clear to auscultation bilaterally BACK:  No CVA tenderness CHEST:  Unremarkable HEART:  PMI not displaced or sustained,S1 and S2 within normal limits, no S3, no S4,  no clicks, no rubs, no murmurs ABD:  Flat, positive bowel sounds normal in frequency in pitch, no bruits, no rebound, no guarding, no midline pulsatile mass, no hepatomegaly, no splenomegaly EXT:  2 plus pulses throughout, no edema, no cyanosis no clubbing SKIN:  No rashes no nodules NEURO:  Cranial nerves II through XII grossly intact, motor grossly intact throughout PSYCH:  Cognitively intact, oriented to person place and time    EKG:  EKG is ordered today. The ekg ordered today demonstrates sinus rhythm, rate 98, premature atrial contractions, axis within normal limits, intervals within normal limits, no acute ST-T wave changes.   Recent Labs: 05/29/2021: ALT 16; BUN 13; Creatinine, Ser 0.94; Hemoglobin 11.1; Platelets 267; Potassium 3.8; Sodium 137    Lipid Panel No results found for: "CHOL", "TRIG", "HDL", "CHOLHDL", "VLDL", "LDLCALC", "LDLDIRECT"    Wt Readings from Last 3 Encounters:  11/28/21 215 lb 3.2 oz (97.6 kg)  05/29/21 227 lb (103 kg)  08/12/18 230 lb (104.3 kg)      Other studies Reviewed: Additional studies/ records that were reviewed today include:   Labs, primary care office records. Review of the above records demonstrates:  Please see elsewhere in the note.     ASSESSMENT AND PLAN:  ABNORMAL EKG: The patient does have premature atrial contractions.  I will get the EKG from her primary provider but it sounds like they were saying the same thing or sinus arrhythmia.  She has no symptoms.  There are no high risk findings or features.  No further testing  is suggested.  PREOP: The patient has a moderate functional level.  She has no high risk symptoms.  She is not going for high risk procedure.  No further cardiovascular testing is suggested.  Dyslipidemia: Her triglycerides were 257, total 190, LDL 81 HDL 58.  We discussed at length diet control for her hypertriglyceridemia.  DM: A1c is 7.3.  I will defer to [place], [place] E, PA-C  BRUIT: She does have a right carotid bruit and I will order carotid Dopplers.  Current medicines are reviewed at length with the patient today.  The patient does not have concerns regarding medicines.  The following changes have been made:  no change  Labs/ tests ordered today include:   Orders Placed This Encounter  Procedures   EKG 12-Lead   VAS US CAROTID     Disposition:   FU with me as needed.      Signed, Rollene Rotunda, MD  11/28/2021 10:51 AM    [facility]

## 2021-12-03 ENCOUNTER — Ambulatory Visit (HOSPITAL_COMMUNITY)
Admission: RE | Admit: 2021-12-03 | Discharge: 2021-12-03 | Disposition: A | Discharge status: Home / Self Care | Payer: Medicare Other | Source: Ambulatory Visit | Attending: Internal Medicine | Admitting: Internal Medicine

## 2021-12-03 DIAGNOSIS — R0989 Other specified symptoms and signs involving the circulatory and respiratory systems: Secondary | ICD-10-CM | POA: Diagnosis not present

## 2021-12-05 ENCOUNTER — Encounter: Payer: Self-pay | Admitting: *Deleted

## 2021-12-06 ENCOUNTER — Encounter: Payer: Self-pay | Admitting: Cardiology

## 2021-12-09 ENCOUNTER — Encounter (HOSPITAL_COMMUNITY): Payer: Self-pay

## 2021-12-09 NOTE — Progress Notes (Signed)
Anesthesia Review:  PCP: Cardiologist : Chest x-ray : EKG : 11/28/21  Carotids- 12/04/21  Echo : Stress test: Cardiac Cath :  Activity level:  Sleep Study/ CPAP : Fasting Blood Sugar :      / Checks Blood Sugar -- times a day:   Blood Thinner/ Instructions /Last Dose: ASA / Instructions/ Last Dose :  Hgba1c- 11/20/21- 7.3  DM- type

## 2021-12-09 NOTE — Progress Notes (Signed)
DUE TO COVID-19 ONLY ONE VISITOR IS ALLOWED TO COME WITH YOU AND STAY IN THE WAITING ROOM ONLY DURING PRE OP AND PROCEDURE DAY OF SURGERY.  2 VISITOR  MAY VISIT WITH YOU AFTER SURGERY IN YOUR PRIVATE ROOM DURING VISITING HOURS ONLY! YOU MAY HAVE ONE PERSON SPEND THE NITE WITH YOU IN YOUR ROOM AFTER SURGERY.       Your procedure is scheduled on:             12/23/21   Report to [facility]  Entrance Am   Report to admitting at                AM DO NOT BRING INSURANCE CARD, PICTURE ID OR WALLET DAY OF SURGERY.      Call this number if you have problems the morning of surgery [phone]    REMEMBER: NO  SOLID FOODS , CANDY, GUM OR MINTS AFTER MIDNITE THE NITE BEFORE SURGERY .       Marland Kitchen CLEAR LIQUIDS UNTIL                DAY OF am SURGERY.      PLEASE FINISH ENSURE DRINK PER SURGEON ORDER  WHICH NEEDS TO BE COMPLETED AT          MORNING OF SURGERY.  Am      CLEAR LIQUID DIET   Foods Allowed      WATER BLACK COFFEE ( SUGAR OK, NO MILK, CREAM OR CREAMER) REGULAR AND DECAF  TEA ( SUGAR OK NO MILK, CREAM, OR CREAMER) REGULAR AND DECAF  PLAIN JELLO ( NO RED)  FRUIT ICES ( NO RED, NO FRUIT PULP)  POPSICLES ( NO RED)  JUICE- APPLE, WHITE GRAPE AND WHITE CRANBERRY  SPORT DRINK LIKE GATORADE ( NO RED)  CLEAR BROTH ( VEGETABLE , CHICKEN OR BEEF)                                                                     BRUSH YOUR TEETH MORNING OF SURGERY AND RINSE YOUR MOUTH OUT, NO CHEWING GUM CANDY OR MINTS.     Take these medicines the morning of surgery with A SIP OF WATER:  omeprazole, eye drops as usual    DO NOT TAKE ANY DIABETIC MEDICATIONS DAY OF YOUR SURGERY                               You may not have any metal on your body including hair pins and              piercings  Do not wear jewelry, make-up, lotions, powders or perfumes, deodorant             Do not wear nail polish on your fingernails.              IF YOU ARE A FEMALE AND WANT TO SHAVE UNDER ARMS OR LEGS  PRIOR TO SURGERY YOU MUST DO SO AT LEAST 48 HOURS PRIOR TO SURGERY.              Men may shave face and neck.   Do not bring valuables to the hospital. [place] IS NOT  RESPONSIBLE   FOR VALUABLES.  Contacts, dentures or bridgework may not be worn into surgery.  Leave suitcase in the car. After surgery it may be brought to your room.     Patients discharged the day of surgery will not be allowed to drive home. IF YOU ARE HAVING SURGERY AND GOING HOME THE SAME DAY, YOU MUST HAVE AN ADULT TO DRIVE YOU HOME AND BE WITH YOU FOR 24 HOURS. YOU MAY GO HOME BY TAXI OR UBER OR ORTHERWISE, BUT AN ADULT MUST ACCOMPANY YOU HOME AND STAY WITH YOU FOR 24 HOURS.                Please read over the following fact sheets you were given: _____________________________________________________________________  [facility] - Preparing for Surgery Before surgery, you can play an important role.  Because skin is not sterile, your skin needs to be as free of germs as possible.  You can reduce the number of germs on your skin by washing with CHG (chlorahexidine gluconate) soap before surgery.  CHG is an antiseptic cleaner which kills germs and bonds with the skin to continue killing germs even after washing. Please DO NOT use if you have an allergy to CHG or antibacterial soaps.  If your skin becomes reddened/irritated stop using the CHG and inform your nurse when you arrive at Short Stay. Do not shave (including legs and underarms) for at least 48 hours prior to the first CHG shower.  You may shave your face/neck. Please follow these instructions carefully:  1.  Shower with CHG Soap the night before surgery and the  morning of Surgery.  2.  If you choose to wash your hair, wash your hair first as usual with your  normal  shampoo.  3.  After you shampoo, rinse your hair and body thoroughly to remove the  shampoo.                           4.  Use CHG as you would any other liquid soap.  You can apply chg  directly  to the skin and wash                       Gently with a scrungie or clean washcloth.  5.  Apply the CHG Soap to your body ONLY FROM THE NECK DOWN.   Do not use on face/ open                           Wound or open sores. Avoid contact with eyes, ears mouth and genitals (private parts).                       Wash face,  Genitals (private parts) with your normal soap.             6.  Wash thoroughly, paying special attention to the area where your surgery  will be performed.  7.  Thoroughly rinse your body with warm water from the neck down.  8.  DO NOT shower/wash with your normal soap after using and rinsing off  the CHG Soap.                9.  Pat yourself dry with a clean towel.            10.  Wear clean pajamas.  11.  Place clean sheets on your bed the night of your first shower and do not  sleep with pets. Day of Surgery : Do not apply any lotions/deodorants the morning of surgery.  Please wear clean clothes to the hospital/surgery center.  FAILURE TO FOLLOW THESE INSTRUCTIONS MAY RESULT IN THE CANCELLATION OF YOUR SURGERY PATIENT SIGNATURE_________________________________  NURSE SIGNATURE__________________________________  ________________________________________________________________________

## 2021-12-09 NOTE — H&P (Signed)
TOTAL KNEE ADMISSION H&P  Patient is being admitted for right total knee arthroplasty.  Subjective:  Chief Complaint: Right knee pain.  HPI: Olivia Walker, 71 y.o. female has a history of pain and functional disability in the right knee due to arthritis and has failed non-surgical conservative treatments for greater than 12 weeks to include NSAID's and/or analgesics, corticosteriod injections, viscosupplementation injections, and activity modification. Onset of symptoms was gradual, starting  several  years ago with gradually worsening course since that time. The patient noted no past surgery on the right knee.  Patient currently rates pain in the right knee at 9 out of 10 with activity. Patient has night pain, worsening of pain with activity and weight bearing, pain that interferes with activities of daily living, crepitus, and joint swelling. Patient has evidence of  bone-on-bone arthritis in the lateral and patellofemoral compartments of the bilateral knees, worse on the right than the left. There is valgus deformity in the right knee  by imaging studies. There is no active infection.  Patient Active Problem List   Diagnosis Date Noted   Nonspecific abnormal electrocardiogram (ECG) (EKG) 11/28/2021   Preop cardiovascular exam 11/28/2021   Insomnia, persistent 10/08/2012   Depression with anxiety 10/08/2012   OA (osteoarthritis) of hip 07/07/2012    Past Medical History:  Diagnosis Date   Ankle swelling    Anxiety    Arthritis    CHF (congestive heart failure) (HCC)    Chronic pain    knees, hips   Depression with anxiety 10/08/2012    Treated since 1995 with Prozac, disability.    Diabetes mellitus without complication (HCC)    prediabetic   Dyspnea    Fibromyalgia    GERD (gastroesophageal reflux disease)    Headache    Hemorrhoids    mild   High cholesterol    Hypertension    Impaired glucose tolerance    OA (osteoarthritis)    hips, knees   Obesity    PONV  (postoperative nausea and vomiting)    Sleep apnea    not using cpap   Stones in the urinary tract     Past Surgical History:  Procedure Laterality Date   CHOLECYSTECTOMY  12/30/2011   Procedure: LAPAROSCOPIC CHOLECYSTECTOMY;  Surgeon: Emelia Loron, MD;  Location: [facility] OR;  Service: General;  Laterality: N/A;   EYE SURGERY     HAND SURGERY     rt carpal tunnel 90   HYSTERECTOMY ABDOMINAL WITH SALPINGECTOMY     kidney stones     KNEE SURGERY Bilateral 01:left,1985:right   torn meniscus lft   KNEE SURGERY  05/27/1983   lateral release   NASAL SEPTUM SURGERY     TOTAL HIP ARTHROPLASTY Right 07/07/2012   Procedure: TOTAL HIP ARTHROPLASTY;  Surgeon: Loanne Drilling, MD;  Location: [facility] ORS;  Service: Orthopedics;  Laterality: Right;   TOTAL HIP ARTHROPLASTY  05/26/2012    Prior to Admission medications   Medication Sig Start Date End Date Taking? Authorizing Provider  ALPRAZolam Prudy Feeler) 0.5 MG tablet Take 0.25 mg by mouth at bedtime.   Yes [provider]  atorvastatin (LIPITOR) 10 MG tablet Take 10 mg by mouth at bedtime.   Yes [provider]  FLUoxetine (PROZAC) 40 MG capsule Take 40 mg by mouth at bedtime.   Yes [provider]  JANUVIA 100 MG tablet Take 100 mg by mouth daily. 10/23/21  Yes [provider]  metFORMIN (GLUCOPHAGE-XR) 750 MG 24 hr tablet Take 750 mg  by mouth 2 (two) times daily. 09/11/21  Yes [provider]  omeprazole (PRILOSEC) 40 MG capsule Take 40 mg by mouth daily. 09/18/21  Yes [provider]  ondansetron (ZOFRAN-ODT) 4 MG disintegrating tablet Take 4 mg by mouth every 8 (eight) hours as needed for nausea or vomiting.   Yes [provider]  prednisoLONE acetate (PRED FORTE) 1 % ophthalmic suspension Place 1 drop into the left eye 4 (four) times daily.   Yes [provider]  tamsulosin (FLOMAX) 0.4 MG CAPS capsule Take 1 capsule (0.4 mg total) by mouth daily. Patient taking differently:  Take 0.4 mg by mouth daily as needed (Kidney stones). 08/12/18  Yes Palumbo, April, MD  traZODone (DESYREL) 50 MG tablet Take 150 mg by mouth at bedtime. 10/04/21  Yes [provider]  glucose blood (ONETOUCH VERIO) test strip Once daily testing E11.9    [provider]    Allergies  Allergen Reactions   Penicillins     From when younger    Social History   Socioeconomic History   Marital status: Single    Spouse name: Not on file   Number of children: Not on file   Years of education: 12   Highest education level: Not on file  Occupational History    Employer: DISABLED  Tobacco Use   Smoking status: Never   Smokeless tobacco: Never  Vaping Use   Vaping Use: Never used  Substance and Sexual Activity   Alcohol use: No   Drug use: No   Sexual activity: Not on file  Other Topics Concern   Not on file  Social History Narrative   Not on file   Social Determinants of Health   Financial Resource Strain: Not on file  Food Insecurity: Not on file  Transportation Needs: Not on file  Physical Activity: Not on file  Stress: Not on file  Social Connections: Not on file  Intimate Partner Violence: Not on file    Tobacco Use: Low Risk  (12/09/2021)   Patient History    Smoking Tobacco Use: Never    Smokeless Tobacco Use: Never    Passive Exposure: Not on file   Social History   Substance and Sexual Activity  Alcohol Use No    Family History  Problem Relation Age of Onset   Diabetes Mother    Heart Problems Mother        CABG   Breast cancer Neg Hx     Review of Systems  Constitutional:  Negative for chills and fever.  HENT:  Negative for congestion, sore throat and tinnitus.   Eyes:  Negative for double vision, photophobia and pain.  Respiratory:  Negative for cough, shortness of breath and wheezing.   Cardiovascular:  Negative for chest pain, palpitations and orthopnea.  Gastrointestinal:  Negative for heartburn, nausea and vomiting.   Genitourinary:  Negative for dysuria, frequency and urgency.  Musculoskeletal:  Positive for joint pain.  Neurological:  Negative for dizziness, weakness and headaches.    Objective:  Physical Exam: Well nourished and well developed.  General: Alert and oriented x3, cooperative and pleasant, no acute distress.  Head: normocephalic, atraumatic, neck supple.  Eyes: EOMI.  Musculoskeletal:  Right Knee Exam: Valgus deformity. No effusion present. No swelling present. The range of motion is: 10 to 120 degrees. Marked crepitus on range of motion of the knee. Positive lateral greater than medial joint line tenderness. The knee is stable.  Calves soft and nontender. Motor function intact in  LE. Strength 5/5 LE bilaterally. Neuro: Distal pulses 2+. Sensation to light touch intact in LE.  Imaging Review Plain radiographs demonstrate severe degenerative joint disease of the right knee. The overall alignment is significant valgus. The bone quality appears to be adequate for age and reported activity level.  Assessment/Plan:  End stage arthritis, right knee   The patient history, physical examination, clinical judgment of the provider and imaging studies are consistent with end stage degenerative joint disease of the right knee and total knee arthroplasty is deemed medically necessary. The treatment options including medical management, injection therapy arthroscopy and arthroplasty were discussed at length. The risks and benefits of total knee arthroplasty were presented and reviewed. The risks due to aseptic loosening, infection, stiffness, patella tracking problems, thromboembolic complications and other imponderables were discussed. The patient acknowledged the explanation, agreed to proceed with the plan and consent was signed. Patient is being admitted for inpatient treatment for surgery, pain control, PT, OT, prophylactic antibiotics, VTE prophylaxis, progressive ambulation and ADLs and  discharge planning. The patient is planning to be discharged  home .   Patient's anticipated LOS is less than 2 midnights, meeting these requirements: - Lives within 1 hour of care - Has a competent adult at home to recover with post-op recover - NO history of  - Chronic pain requiring opioids  - Diabetes  - Coronary Artery Disease  - Heart failure  - Heart attack  - Stroke  - DVT/VTE  - Cardiac arrhythmia  - Respiratory Failure/COPD  - Renal failure  - Anemia  - Advanced Liver disease  Therapy Plans: Outpatient therapy at [facility] Disposition: Home with friend Planned DVT Prophylaxis: Aspirin 325 mg BID DME Needed: None PCP: [place], PA-C (clearance received) Cardiologist: Rollene Rotunda, MD (clearance received) TXA: IV Allergies: PCN (hives - childhood rxn) Anesthesia Concerns: None BMI: 37.6 Last HgbA1c: 7.3% (11/20/21) Pharmacy: [facility] [place])   - Patient was instructed on what medications to stop prior to surgery. - Follow-up visit in 2 weeks with Dr. Lequita Halt - Begin physical therapy following surgery - Pre-operative lab work as pre-surgical testing - Prescriptions will be provided in hospital at time of discharge  Arther Abbott, PA-C Orthopedic Surgery [facility] [place]

## 2021-12-11 ENCOUNTER — Encounter (HOSPITAL_COMMUNITY)
Admission: RE | Admit: 2021-12-11 | Discharge: 2021-12-11 | Disposition: A | Discharge status: Home / Self Care | Payer: Medicare Other | Source: Ambulatory Visit | Attending: Orthopedic Surgery | Admitting: Orthopedic Surgery

## 2021-12-11 ENCOUNTER — Other Ambulatory Visit: Payer: Self-pay

## 2021-12-11 ENCOUNTER — Other Ambulatory Visit (HOSPITAL_COMMUNITY): Payer: Medicare Other

## 2021-12-11 ENCOUNTER — Encounter (HOSPITAL_COMMUNITY): Payer: Self-pay

## 2021-12-11 VITALS — BP 150/69 | HR 37 | Temp 98.9°F | Resp 16 | Ht 64.0 in | Wt 215.2 lb

## 2021-12-11 DIAGNOSIS — I491 Atrial premature depolarization: Secondary | ICD-10-CM | POA: Diagnosis not present

## 2021-12-11 DIAGNOSIS — Z79899 Other long term (current) drug therapy: Secondary | ICD-10-CM | POA: Diagnosis not present

## 2021-12-11 DIAGNOSIS — Z01818 Encounter for other preprocedural examination: Secondary | ICD-10-CM | POA: Diagnosis present

## 2021-12-11 HISTORY — DX: Essential (primary) hypertension: I10

## 2021-12-11 HISTORY — DX: Headache, unspecified: R51.9

## 2021-12-11 HISTORY — DX: Heart failure, unspecified: I50.9

## 2021-12-11 HISTORY — DX: Fibromyalgia: M79.7

## 2021-12-11 HISTORY — DX: Other specified postprocedural states: Z98.890

## 2021-12-11 HISTORY — DX: Dyspnea, unspecified: R06.00

## 2021-12-11 HISTORY — DX: Personal history of urinary calculi: Z87.442

## 2021-12-11 HISTORY — DX: Nausea with vomiting, unspecified: R11.2

## 2021-12-11 LAB — CBC
HCT: 38.2 % (ref 36.0–46.0)
Hemoglobin: 11.8 g/dL — ABNORMAL LOW (ref 12.0–15.0)
MCH: 26.2 pg (ref 26.0–34.0)
MCHC: 30.9 g/dL (ref 30.0–36.0)
MCV: 84.9 fL (ref 80.0–100.0)
Platelets: 294 10*3/uL (ref 150–400)
RBC: 4.5 MIL/uL (ref 3.87–5.11)
RDW: 16 % — ABNORMAL HIGH (ref 11.5–15.5)
WBC: 14.3 10*3/uL — ABNORMAL HIGH (ref 4.0–10.5)
nRBC: 0 % (ref 0.0–0.2)

## 2021-12-11 LAB — BASIC METABOLIC PANEL
Anion gap: 10 (ref 5–15)
BUN: 16 mg/dL (ref 8–23)
CO2: 24 mmol/L (ref 22–32)
Calcium: 9.4 mg/dL (ref 8.9–10.3)
Chloride: 102 mmol/L (ref 98–111)
Creatinine, Ser: 0.96 mg/dL (ref 0.44–1.00)
GFR, Estimated: >60 mL/min (ref >60)
Glucose, Bld: 218 mg/dL — ABNORMAL HIGH (ref 70–99)
Potassium: 3.7 mmol/L (ref 3.5–5.1)
Sodium: 136 mmol/L (ref 135–145)

## 2021-12-11 LAB — SURGICAL PCR SCREEN
MRSA, PCR: NEGATIVE
Staphylococcus aureus: NEGATIVE

## 2021-12-11 LAB — GLUCOSE, CAPILLARY: Glucose-Capillary: 226 mg/dL — ABNORMAL HIGH (ref 70–99)

## 2021-12-12 ENCOUNTER — Encounter (HOSPITAL_COMMUNITY): Payer: Self-pay

## 2021-12-20 NOTE — Progress Notes (Signed)
Unable to reach pt with number provided. LVMM for return call back. Have not received return call back. Spoke with pts contact number Olivia Walker whom will be bringing pt on Monday. Aware to arrive at [facility] admitting at [facility] Pt to have no food after midnight; clear liquids from midnight  till 0515 consuming entire pre surgery drink by 0515 then nothing by mouth.

## 2021-12-23 ENCOUNTER — Encounter (HOSPITAL_COMMUNITY)
Admission: RE | Disposition: A | Discharge status: Home / Self Care | Payer: Self-pay | Source: Home / Self Care | Attending: Orthopedic Surgery

## 2021-12-23 ENCOUNTER — Encounter (HOSPITAL_COMMUNITY): Payer: Self-pay | Admitting: Orthopedic Surgery

## 2021-12-23 ENCOUNTER — Other Ambulatory Visit: Payer: Self-pay

## 2021-12-23 ENCOUNTER — Ambulatory Visit (HOSPITAL_COMMUNITY): Payer: Medicare Other | Admitting: Physician Assistant

## 2021-12-23 ENCOUNTER — Ambulatory Visit (HOSPITAL_BASED_OUTPATIENT_CLINIC_OR_DEPARTMENT_OTHER): Payer: Medicare Other | Admitting: Anesthesiology

## 2021-12-23 ENCOUNTER — Observation Stay (HOSPITAL_COMMUNITY)
Admission: RE | Admit: 2021-12-23 | Discharge: 2021-12-27 | Disposition: A | Discharge status: Home / Self Care | Payer: Medicare Other | Attending: Orthopedic Surgery | Admitting: Orthopedic Surgery

## 2021-12-23 DIAGNOSIS — M179 Osteoarthritis of knee, unspecified: Secondary | ICD-10-CM | POA: Diagnosis present

## 2021-12-23 DIAGNOSIS — I110 Hypertensive heart disease with heart failure: Secondary | ICD-10-CM | POA: Diagnosis not present

## 2021-12-23 DIAGNOSIS — Z96641 Presence of right artificial hip joint: Secondary | ICD-10-CM | POA: Insufficient documentation

## 2021-12-23 DIAGNOSIS — M1711 Unilateral primary osteoarthritis, right knee: Principal | ICD-10-CM | POA: Insufficient documentation

## 2021-12-23 DIAGNOSIS — E119 Type 2 diabetes mellitus without complications: Secondary | ICD-10-CM | POA: Diagnosis not present

## 2021-12-23 DIAGNOSIS — I509 Heart failure, unspecified: Secondary | ICD-10-CM | POA: Insufficient documentation

## 2021-12-23 DIAGNOSIS — Z01818 Encounter for other preprocedural examination: Secondary | ICD-10-CM

## 2021-12-23 DIAGNOSIS — M25561 Pain in right knee: Secondary | ICD-10-CM | POA: Insufficient documentation

## 2021-12-23 DIAGNOSIS — Z79899 Other long term (current) drug therapy: Secondary | ICD-10-CM | POA: Insufficient documentation

## 2021-12-23 DIAGNOSIS — Z7984 Long term (current) use of oral hypoglycemic drugs: Secondary | ICD-10-CM | POA: Diagnosis not present

## 2021-12-23 HISTORY — PX: TOTAL KNEE ARTHROPLASTY: SHX125

## 2021-12-23 LAB — GLUCOSE, CAPILLARY
Glucose-Capillary: 119 mg/dL — ABNORMAL HIGH (ref 70–99)
Glucose-Capillary: 97 mg/dL (ref 70–99)
Glucose-Capillary: 237 mg/dL — ABNORMAL HIGH (ref 70–99)
Glucose-Capillary: 221 mg/dL — ABNORMAL HIGH (ref 70–99)

## 2021-12-23 LAB — HEMOGLOBIN A1C
Hgb A1c MFr Bld: 7.4 % — ABNORMAL HIGH (ref 4.8–5.6)
Mean Plasma Glucose: 165.68 mg/dL

## 2021-12-23 SURGERY — ARTHROPLASTY, KNEE, TOTAL
Anesthesia: Spinal | Site: Knee | Laterality: Right

## 2021-12-23 MED ORDER — ACETAMINOPHEN 10 MG/ML IV SOLN
1000.0000 mg | Freq: Once | INTRAVENOUS | Status: DC | PRN
Start: 2021-12-23 — End: 2021-12-23

## 2021-12-23 MED ORDER — LIDOCAINE HCL (PF) 2 % IJ SOLN
INTRAMUSCULAR | Status: AC
  Filled 2021-12-23: qty 5

## 2021-12-23 MED ORDER — OXYCODONE HCL 5 MG/5ML PO SOLN: 5.0000 mg | Freq: Once | ORAL | Status: DC | PRN

## 2021-12-23 MED ORDER — MIDAZOLAM HCL 2 MG/2ML IJ SOLN
1.0000 mg | INTRAMUSCULAR | Status: DC
  Filled 2021-12-23: qty 2

## 2021-12-23 MED ORDER — TRANEXAMIC ACID-NACL 1000-0.7 MG/100ML-% IV SOLN
1000.0000 mg | INTRAVENOUS | Status: DC
  Filled 2021-12-23: qty 100

## 2021-12-23 MED ORDER — METHOCARBAMOL 1000 MG/10ML IJ SOLN: 500.0000 mg | Freq: Four times a day (QID) | INTRAVENOUS | Status: DC | PRN

## 2021-12-23 MED ORDER — ALPRAZOLAM 0.25 MG PO TABS
0.2500 mg | ORAL_TABLET | Freq: Every day | ORAL | Status: DC
  Administered 2021-12-23 – 2021-12-26 (×4): 0.25 mg via ORAL
  Filled 2021-12-23 (×4): qty 1

## 2021-12-23 MED ORDER — DIPHENHYDRAMINE HCL 12.5 MG/5ML PO ELIX: 12.5000 mg | ORAL_SOLUTION | ORAL | Status: DC | PRN

## 2021-12-23 MED ORDER — CEFAZOLIN SODIUM-DEXTROSE 2-4 GM/100ML-% IV SOLN
2.0000 g | Freq: Four times a day (QID) | INTRAVENOUS | Status: AC
  Administered 2021-12-23 (×2): 2 g via INTRAVENOUS
  Filled 2021-12-23 (×2): qty 100

## 2021-12-23 MED ORDER — ONDANSETRON HCL 4 MG/2ML IJ SOLN
4.0000 mg | Freq: Four times a day (QID) | INTRAMUSCULAR | Status: DC | PRN
  Administered 2021-12-24: 4 mg via INTRAVENOUS
  Filled 2021-12-23: qty 2

## 2021-12-23 MED ORDER — FLUOXETINE HCL 20 MG PO CAPS
40.0000 mg | ORAL_CAPSULE | Freq: Every day | ORAL | Status: DC
  Administered 2021-12-23 – 2021-12-26 (×4): 40 mg via ORAL
  Filled 2021-12-23 (×4): qty 2

## 2021-12-23 MED ORDER — INSULIN ASPART 100 UNIT/ML IJ SOLN
0.0000 [IU] | Freq: Three times a day (TID) | INTRAMUSCULAR | Status: DC
  Administered 2021-12-23 – 2021-12-24 (×2): 5 [IU] via SUBCUTANEOUS
  Administered 2021-12-25: 3 [IU] via SUBCUTANEOUS
  Administered 2021-12-25: 5 [IU] via SUBCUTANEOUS
  Administered 2021-12-25 – 2021-12-26 (×4): 3 [IU] via SUBCUTANEOUS
  Administered 2021-12-27 (×2): 5 [IU] via SUBCUTANEOUS

## 2021-12-23 MED ORDER — METOCLOPRAMIDE HCL 5 MG/ML IJ SOLN: 5.0000 mg | Freq: Three times a day (TID) | INTRAMUSCULAR | Status: DC | PRN

## 2021-12-23 MED ORDER — DEXAMETHASONE SODIUM PHOSPHATE 10 MG/ML IJ SOLN
INTRAMUSCULAR | Status: AC
  Filled 2021-12-23: qty 1

## 2021-12-23 MED ORDER — HYDROMORPHONE HCL 1 MG/ML IJ SOLN: 0.2500 mg | INTRAMUSCULAR | Status: DC | PRN

## 2021-12-23 MED ORDER — POLYETHYLENE GLYCOL 3350 17 G PO PACK: 17.0000 g | PACK | Freq: Every day | ORAL | Status: DC | PRN

## 2021-12-23 MED ORDER — OXYCODONE HCL 5 MG PO TABS
5.0000 mg | ORAL_TABLET | ORAL | Status: DC | PRN
  Administered 2021-12-24 – 2021-12-27 (×6): 5 mg via ORAL
  Filled 2021-12-23 (×6): qty 1

## 2021-12-23 MED ORDER — TRAZODONE HCL 100 MG PO TABS
150.0000 mg | ORAL_TABLET | Freq: Every day | ORAL | Status: DC
  Administered 2021-12-23 – 2021-12-26 (×4): 150 mg via ORAL
  Filled 2021-12-23 (×4): qty 1

## 2021-12-23 MED ORDER — OXYCODONE HCL 5 MG PO TABS: 5.0000 mg | ORAL_TABLET | Freq: Once | ORAL | Status: DC | PRN

## 2021-12-23 MED ORDER — BUPIVACAINE LIPOSOME 1.3 % IJ SUSP: 20.0000 mL | Freq: Once | INTRAMUSCULAR | Status: DC

## 2021-12-23 MED ORDER — METOCLOPRAMIDE HCL 5 MG PO TABS: 5.0000 mg | ORAL_TABLET | Freq: Three times a day (TID) | ORAL | Status: DC | PRN

## 2021-12-23 MED ORDER — POVIDONE-IODINE 10 % EX SWAB: 2.0000 "application " | Freq: Once | CUTANEOUS | Status: DC

## 2021-12-23 MED ORDER — LACTATED RINGERS IV SOLN: INTRAVENOUS | Status: DC

## 2021-12-23 MED ORDER — LIDOCAINE 2% (20 MG/ML) 5 ML SYRINGE
INTRAMUSCULAR | Status: DC | PRN
  Administered 2021-12-23: 40 mg via INTRAVENOUS

## 2021-12-23 MED ORDER — ROPIVACAINE HCL 5 MG/ML IJ SOLN
INTRAMUSCULAR | Status: DC | PRN
  Administered 2021-12-23: 20 mL via PERINEURAL

## 2021-12-23 MED ORDER — SODIUM CHLORIDE (PF) 0.9 % IJ SOLN
INTRAMUSCULAR | Status: DC | PRN
  Administered 2021-12-23: 60 mL via INTRAVENOUS

## 2021-12-23 MED ORDER — PHENYLEPHRINE 80 MCG/ML (10ML) SYRINGE FOR IV PUSH (FOR BLOOD PRESSURE SUPPORT)
PREFILLED_SYRINGE | INTRAVENOUS | Status: DC | PRN
  Administered 2021-12-23: 80 ug via INTRAVENOUS
  Administered 2021-12-23: 40 ug via INTRAVENOUS
  Administered 2021-12-23 (×2): 80 ug via INTRAVENOUS
  Administered 2021-12-23: 40 ug via INTRAVENOUS

## 2021-12-23 MED ORDER — CHLORHEXIDINE GLUCONATE 0.12 % MT SOLN
15.0000 mL | Freq: Once | OROMUCOSAL | Status: AC
  Administered 2021-12-23: 15 mL via OROMUCOSAL

## 2021-12-23 MED ORDER — ACETAMINOPHEN 500 MG PO TABS
1000.0000 mg | ORAL_TABLET | Freq: Four times a day (QID) | ORAL | Status: AC
  Administered 2021-12-23 – 2021-12-24 (×4): 1000 mg via ORAL
  Filled 2021-12-23 (×4): qty 2

## 2021-12-23 MED ORDER — TAMSULOSIN HCL 0.4 MG PO CAPS: 0.4000 mg | ORAL_CAPSULE | Freq: Every day | ORAL | Status: DC | PRN

## 2021-12-23 MED ORDER — ACETAMINOPHEN 10 MG/ML IV SOLN
1000.0000 mg | Freq: Four times a day (QID) | INTRAVENOUS | Status: DC
  Administered 2021-12-23: 1000 mg via INTRAVENOUS
  Filled 2021-12-23: qty 100

## 2021-12-23 MED ORDER — SODIUM CHLORIDE (PF) 0.9 % IJ SOLN
INTRAMUSCULAR | Status: AC
Start: 2021-12-23 — End: ?
  Filled 2021-12-23: qty 50

## 2021-12-23 MED ORDER — DOCUSATE SODIUM 100 MG PO CAPS
100.0000 mg | ORAL_CAPSULE | Freq: Two times a day (BID) | ORAL | Status: DC
  Administered 2021-12-23 – 2021-12-27 (×9): 100 mg via ORAL
  Filled 2021-12-23 (×9): qty 1

## 2021-12-23 MED ORDER — CEFAZOLIN SODIUM-DEXTROSE 2-4 GM/100ML-% IV SOLN
2.0000 g | INTRAVENOUS | Status: AC
  Administered 2021-12-23: 2 g via INTRAVENOUS
  Filled 2021-12-23: qty 100

## 2021-12-23 MED ORDER — DEXAMETHASONE SODIUM PHOSPHATE 10 MG/ML IJ SOLN: 8.0000 mg | Freq: Once | INTRAMUSCULAR | Status: DC

## 2021-12-23 MED ORDER — PROPOFOL 1000 MG/100ML IV EMUL
INTRAVENOUS | Status: AC
  Filled 2021-12-23: qty 100

## 2021-12-23 MED ORDER — ONDANSETRON HCL 4 MG PO TABS: 4.0000 mg | ORAL_TABLET | Freq: Four times a day (QID) | ORAL | Status: DC | PRN

## 2021-12-23 MED ORDER — BUPIVACAINE LIPOSOME 1.3 % IJ SUSP
INTRAMUSCULAR | Status: AC
  Filled 2021-12-23: qty 20

## 2021-12-23 MED ORDER — ORAL CARE MOUTH RINSE: 15.0000 mL | Freq: Once | OROMUCOSAL | Status: AC

## 2021-12-23 MED ORDER — PREDNISOLONE ACETATE 1 % OP SUSP
1.0000 [drp] | Freq: Four times a day (QID) | OPHTHALMIC | Status: DC
  Administered 2021-12-24: 1 [drp] via OPHTHALMIC
  Filled 2021-12-23: qty 5

## 2021-12-23 MED ORDER — INSULIN ASPART 100 UNIT/ML IJ SOLN
0.0000 [IU] | Freq: Every day | INTRAMUSCULAR | Status: DC
  Administered 2021-12-23 – 2021-12-26 (×2): 2 [IU] via SUBCUTANEOUS

## 2021-12-23 MED ORDER — TRANEXAMIC ACID-NACL 1000-0.7 MG/100ML-% IV SOLN
INTRAVENOUS | Status: DC | PRN
  Administered 2021-12-23: 1000 mg via INTRAVENOUS

## 2021-12-23 MED ORDER — PROPOFOL 10 MG/ML IV BOLUS
INTRAVENOUS | Status: DC | PRN
  Administered 2021-12-23: 30 mg via INTRAVENOUS

## 2021-12-23 MED ORDER — FENTANYL CITRATE PF 50 MCG/ML IJ SOSY
50.0000 ug | PREFILLED_SYRINGE | INTRAMUSCULAR | Status: DC
  Administered 2021-12-23: 50 ug via INTRAVENOUS
  Filled 2021-12-23: qty 2

## 2021-12-23 MED ORDER — BISACODYL 10 MG RE SUPP: 10.0000 mg | Freq: Every day | RECTAL | Status: DC | PRN

## 2021-12-23 MED ORDER — BUPIVACAINE IN DEXTROSE 0.75-8.25 % IT SOLN
INTRATHECAL | Status: DC | PRN
  Administered 2021-12-23: 1.6 mL via INTRATHECAL

## 2021-12-23 MED ORDER — SODIUM CHLORIDE 0.9 % IV SOLN: INTRAVENOUS | Status: DC

## 2021-12-23 MED ORDER — PROPOFOL 500 MG/50ML IV EMUL
INTRAVENOUS | Status: DC | PRN
  Administered 2021-12-23: 75 ug/kg/min via INTRAVENOUS

## 2021-12-23 MED ORDER — METHOCARBAMOL 500 MG PO TABS
500.0000 mg | ORAL_TABLET | Freq: Four times a day (QID) | ORAL | Status: DC | PRN
  Administered 2021-12-23 – 2021-12-27 (×6): 500 mg via ORAL
  Filled 2021-12-23 (×6): qty 1

## 2021-12-23 MED ORDER — FLEET ENEMA 7-19 GM/118ML RE ENEM: 1.0000 | ENEMA | Freq: Once | RECTAL | Status: DC | PRN

## 2021-12-23 MED ORDER — SODIUM CHLORIDE (PF) 0.9 % IJ SOLN
INTRAMUSCULAR | Status: AC
  Filled 2021-12-23: qty 10

## 2021-12-23 MED ORDER — LINAGLIPTIN 5 MG PO TABS
5.0000 mg | ORAL_TABLET | Freq: Every day | ORAL | Status: DC
  Administered 2021-12-24 – 2021-12-27 (×4): 5 mg via ORAL
  Filled 2021-12-23 (×5): qty 1

## 2021-12-23 MED ORDER — ONDANSETRON HCL 4 MG/2ML IJ SOLN
INTRAMUSCULAR | Status: AC
  Filled 2021-12-23: qty 2

## 2021-12-23 MED ORDER — MORPHINE SULFATE (PF) 2 MG/ML IV SOLN
1.0000 mg | INTRAVENOUS | Status: DC | PRN
  Administered 2021-12-23 – 2021-12-24 (×3): 2 mg via INTRAVENOUS
  Filled 2021-12-23 (×3): qty 1

## 2021-12-23 MED ORDER — PHENOL 1.4 % MT LIQD: 1.0000 | OROMUCOSAL | Status: DC | PRN

## 2021-12-23 MED ORDER — ONDANSETRON HCL 4 MG/2ML IJ SOLN
4.0000 mg | Freq: Once | INTRAMUSCULAR | Status: AC | PRN
Start: 2021-12-23 — End: 2021-12-23
  Administered 2021-12-23: 4 mg via INTRAVENOUS

## 2021-12-23 MED ORDER — MENTHOL 3 MG MT LOZG: 1.0000 | LOZENGE | OROMUCOSAL | Status: DC | PRN

## 2021-12-23 MED ORDER — SODIUM CHLORIDE 0.9 % IR SOLN
Status: DC | PRN
  Administered 2021-12-23: 1000 mL

## 2021-12-23 MED ORDER — PANTOPRAZOLE SODIUM 40 MG PO TBEC
80.0000 mg | DELAYED_RELEASE_TABLET | Freq: Every day | ORAL | Status: DC
  Administered 2021-12-24 – 2021-12-27 (×4): 80 mg via ORAL
  Filled 2021-12-23 (×4): qty 2

## 2021-12-23 MED ORDER — OXYCODONE HCL 5 MG PO TABS
10.0000 mg | ORAL_TABLET | ORAL | Status: DC | PRN
  Administered 2021-12-23 – 2021-12-27 (×10): 10 mg via ORAL
  Filled 2021-12-23 (×10): qty 2

## 2021-12-23 MED ORDER — ASPIRIN 325 MG PO TBEC
325.0000 mg | DELAYED_RELEASE_TABLET | Freq: Two times a day (BID) | ORAL | Status: DC
  Administered 2021-12-24 – 2021-12-27 (×7): 325 mg via ORAL
  Filled 2021-12-23 (×7): qty 1

## 2021-12-23 MED ORDER — GABAPENTIN 300 MG PO CAPS
300.0000 mg | ORAL_CAPSULE | Freq: Three times a day (TID) | ORAL | Status: DC
  Administered 2021-12-24 – 2021-12-27 (×9): 300 mg via ORAL
  Filled 2021-12-23 (×10): qty 1

## 2021-12-23 MED ORDER — DEXAMETHASONE SODIUM PHOSPHATE 4 MG/ML IJ SOLN
INTRAMUSCULAR | Status: DC | PRN
  Administered 2021-12-23: 4 mg via INTRAVENOUS

## 2021-12-23 MED ORDER — PHENYLEPHRINE 80 MCG/ML (10ML) SYRINGE FOR IV PUSH (FOR BLOOD PRESSURE SUPPORT)
PREFILLED_SYRINGE | INTRAVENOUS | Status: AC
  Filled 2021-12-23: qty 10

## 2021-12-23 MED ORDER — POVIDONE-IODINE 10 % EX SWAB: Freq: Once | CUTANEOUS | Status: AC

## 2021-12-23 MED ORDER — ATORVASTATIN CALCIUM 10 MG PO TABS
10.0000 mg | ORAL_TABLET | Freq: Every day | ORAL | Status: DC
  Administered 2021-12-24 – 2021-12-26 (×3): 10 mg via ORAL
  Filled 2021-12-23 (×3): qty 1

## 2021-12-23 MED ORDER — BUPIVACAINE LIPOSOME 1.3 % IJ SUSP
INTRAMUSCULAR | Status: DC | PRN
  Administered 2021-12-23: 20 mL

## 2021-12-23 SURGICAL SUPPLY — 61 items
ATTUNE MED DOME PAT 38 KNEE (Knees) ×1 IMPLANT
ATTUNE PS FEM RT SZ 5 CEM KNEE (Femur) ×1 IMPLANT
ATTUNE PSRP INSE SZ5 7 KNEE (Insert) ×1 IMPLANT
BAG COUNTER SPONGE SURGICOUNT (BAG) IMPLANT
BAG SPEC THK2 15X12 ZIP CLS (MISCELLANEOUS) ×1
BAG SPNG CNTER NS LX DISP (BAG)
BAG ZIPLOCK 12X15 (MISCELLANEOUS) ×3 IMPLANT
BASE TIBIAL ROT PLAT SZ 5 KNEE (Knees) IMPLANT
BLADE SAG 18X100X1.27 (BLADE) ×3 IMPLANT
BLADE SAW SGTL 11.0X1.19X90.0M (BLADE) ×3 IMPLANT
BNDG ELASTIC 6X5.8 VLCR STR LF (GAUZE/BANDAGES/DRESSINGS) ×3 IMPLANT
BOWL SMART MIX CTS (DISPOSABLE) ×3 IMPLANT
BSPLAT TIB 5 CMNT ROT PLAT STR (Knees) ×1 IMPLANT
CEMENT HV SMART SET (Cement) ×6 IMPLANT
CLSR STERI-STRIP ANTIMIC 1/2X4 (GAUZE/BANDAGES/DRESSINGS) ×1 IMPLANT
COVER SURGICAL LIGHT HANDLE (MISCELLANEOUS) ×3 IMPLANT
CUFF TOURN SGL QUICK 34 (TOURNIQUET CUFF) ×2
CUFF TRNQT CYL 34X4.125X (TOURNIQUET CUFF) ×2 IMPLANT
DRAPE INCISE IOBAN 66X45 STRL (DRAPES) ×3 IMPLANT
DRAPE U-SHAPE 47X51 STRL (DRAPES) ×3 IMPLANT
DRSG AQUACEL AG ADV 3.5X10 (GAUZE/BANDAGES/DRESSINGS) ×3 IMPLANT
DURAPREP 26ML APPLICATOR (WOUND CARE) ×3 IMPLANT
ELECT REM PT RETURN 15FT ADLT (MISCELLANEOUS) ×3 IMPLANT
GLOVE BIO SURGEON STRL SZ 6.5 (GLOVE) IMPLANT
GLOVE BIO SURGEON STRL SZ7.5 (GLOVE) IMPLANT
GLOVE BIO SURGEON STRL SZ8 (GLOVE) ×3 IMPLANT
GLOVE BIOGEL PI IND STRL 6.5 (GLOVE) IMPLANT
GLOVE BIOGEL PI IND STRL 7.0 (GLOVE) IMPLANT
GLOVE BIOGEL PI IND STRL 8 (GLOVE) ×2 IMPLANT
GLOVE BIOGEL PI INDICATOR 6.5 (GLOVE)
GLOVE BIOGEL PI INDICATOR 7.0 (GLOVE)
GLOVE BIOGEL PI INDICATOR 8 (GLOVE) ×1
GOWN STRL REUS W/ TWL LRG LVL3 (GOWN DISPOSABLE) ×2 IMPLANT
GOWN STRL REUS W/ TWL XL LVL3 (GOWN DISPOSABLE) IMPLANT
GOWN STRL REUS W/TWL LRG LVL3 (GOWN DISPOSABLE) ×2
GOWN STRL REUS W/TWL XL LVL3 (GOWN DISPOSABLE)
HANDPIECE INTERPULSE COAX TIP (DISPOSABLE) ×2
HOLDER FOLEY CATH W/STRAP (MISCELLANEOUS) IMPLANT
IMMOBILIZER KNEE 20 (SOFTGOODS) ×2
IMMOBILIZER KNEE 20 THIGH 36 (SOFTGOODS) ×2 IMPLANT
KIT TURNOVER KIT A (KITS) IMPLANT
MANIFOLD NEPTUNE II (INSTRUMENTS) ×3 IMPLANT
NS IRRIG 1000ML POUR BTL (IV SOLUTION) ×3 IMPLANT
PACK TOTAL KNEE CUSTOM (KITS) ×3 IMPLANT
PADDING CAST COTTON 6X4 STRL (CAST SUPPLIES) ×6 IMPLANT
PADDING CAST SYN 6 (CAST SUPPLIES) ×1
PADDING CAST SYNTHETIC 6X4 NS (CAST SUPPLIES) IMPLANT
PROTECTOR NERVE ULNAR (MISCELLANEOUS) ×3 IMPLANT
SET HNDPC FAN SPRY TIP SCT (DISPOSABLE) ×2 IMPLANT
SPIKE FLUID TRANSFER (MISCELLANEOUS) ×3 IMPLANT
STRIP CLOSURE SKIN 1/2X4 (GAUZE/BANDAGES/DRESSINGS) ×6 IMPLANT
SUT MNCRL AB 4-0 PS2 18 (SUTURE) ×3 IMPLANT
SUT STRATAFIX 0 PDS 27 VIOLET (SUTURE) ×2
SUT VIC AB 2-0 CT1 27 (SUTURE) ×6
SUT VIC AB 2-0 CT1 TAPERPNT 27 (SUTURE) ×6 IMPLANT
SUTURE STRATFX 0 PDS 27 VIOLET (SUTURE) ×2 IMPLANT
TIBIAL BASE ROT PLAT SZ 5 KNEE (Knees) ×2 IMPLANT
TRAY FOLEY MTR SLVR 16FR STAT (SET/KITS/TRAYS/PACK) ×3 IMPLANT
TUBE SUCTION HIGH CAP CLEAR NV (SUCTIONS) ×3 IMPLANT
WATER STERILE IRR 1000ML POUR (IV SOLUTION) ×6 IMPLANT
WRAP KNEE MAXI GEL POST OP (GAUZE/BANDAGES/DRESSINGS) ×3 IMPLANT

## 2021-12-23 NOTE — Discharge Instructions (Signed)
 Frank Aluisio, MD Total Joint Specialist [facility] [place] [address] [place], [place] [address] [phone]  TOTAL KNEE REPLACEMENT POSTOPERATIVE DIRECTIONS    Knee Rehabilitation, Guidelines Following Surgery  Results after knee surgery are often greatly improved when you follow the exercise, range of motion and muscle strengthening exercises prescribed by your doctor. Safety measures are also important to protect the knee from further injury. If any of these exercises cause you to have increased pain or swelling in your knee joint, decrease the amount until you are comfortable again and slowly increase them. If you have problems or questions, call your caregiver or physical therapist for advice.   BLOOD CLOT PREVENTION Take a 325 mg Aspirin two times a day for three weeks following surgery. Then take an 81 mg Aspirin once a day for three weeks. Then discontinue Aspirin. You may resume your vitamins/supplements upon discharge from the hospital. Do not take any NSAIDs (Advil, Aleve, Ibuprofen, Meloxicam, etc.) until you have discontinued the 325 mg Aspirin.  HOME CARE INSTRUCTIONS  Remove items at home which could result in a fall. This includes throw rugs or furniture in walking pathways.  ICE to the affected knee as much as tolerated. Icing helps control swelling. If the swelling is well controlled you will be more comfortable and rehab easier. Continue to use ice on the knee for pain and swelling from surgery. You may notice swelling that will progress down to the foot and ankle. This is normal after surgery. Elevate the leg when you are not up walking on it.    Continue to use the breathing machine which will help keep your temperature down. It is common for your temperature to cycle up and down following surgery, especially at night when you are not up moving around and exerting yourself. The breathing machine keeps your lungs expanded and your temperature  down. Do not place pillow under the operative knee, focus on keeping the knee straight while resting  DIET You may resume your previous home diet once you are discharged from the hospital.  DRESSING / WOUND CARE / SHOWERING Keep your bulky bandage on for 2 days. On the third post-operative day you may remove the Ace bandage and gauze. There is a waterproof adhesive bandage on your skin which will stay in place until your first follow-up appointment. Once you remove this you will not need to place another bandage You may begin showering 3 days following surgery, but do not submerge the incision under water.  ACTIVITY For the first 5 days, the key is rest and control of pain and swelling Do your home exercises twice a day starting on post-operative day 3. On the days you go to physical therapy, just do the home exercises once that day. You should rest, ice and elevate the leg for 50 minutes out of every hour. Get up and walk/stretch for 10 minutes per hour. After 5 days you can increase your activity slowly as tolerated. Walk with your walker as instructed. Use the walker until you are comfortable transitioning to a cane. Walk with the cane in the opposite hand of the operative leg. You may discontinue the cane once you are comfortable and walking steadily. Avoid periods of inactivity such as sitting longer than an hour when not asleep. This helps prevent blood clots.  You may discontinue the knee immobilizer once you are able to perform a straight leg raise while lying down. You may resume a sexual relationship in one month   or when given the OK by your doctor.  You may return to work once you are cleared by your doctor.  Do not drive a car for 6 weeks or until released by your surgeon.  Do not drive while taking narcotics.  TED HOSE STOCKINGS Wear the elastic stockings on both legs for three weeks following surgery during the day. You may remove them at night for sleeping.  WEIGHT  BEARING Weight bearing as tolerated with assist device (walker, cane, etc) as directed, use it as long as suggested by your surgeon or therapist, typically at least 4-6 weeks.  POSTOPERATIVE CONSTIPATION PROTOCOL Constipation - defined medically as fewer than three stools per week and severe constipation as less than one stool per week.  One of the most common issues patients have following surgery is constipation.  Even if you have a regular bowel pattern at home, your normal regimen is likely to be disrupted due to multiple reasons following surgery.  Combination of anesthesia, postoperative narcotics, change in appetite and fluid intake all can affect your bowels.  In order to avoid complications following surgery, here are some recommendations in order to help you during your recovery period.  Colace (docusate) - Pick up an over-the-counter form of Colace or another stool softener and take twice a day as long as you are requiring postoperative pain medications.  Take with a full glass of water daily.  If you experience loose stools or diarrhea, hold the colace until you stool forms back up. If your symptoms do not get better within 1 week or if they get worse, check with your doctor. Dulcolax (bisacodyl) - Pick up over-the-counter and take as directed by the product packaging as needed to assist with the movement of your bowels.  Take with a full glass of water.  Use this product as needed if not relieved by Colace only.  MiraLax (polyethylene glycol) - Pick up over-the-counter to have on hand. MiraLax is a solution that will increase the amount of water in your bowels to assist with bowel movements.  Take as directed and can mix with a glass of water, juice, soda, coffee, or tea. Take if you go more than two days without a movement. Do not use MiraLax more than once per day. Call your doctor if you are still constipated or irregular after using this medication for 7 days in a row.  If you continue  to have problems with postoperative constipation, please contact the office for further assistance and recommendations.  If you experience "the worst abdominal pain ever" or develop nausea or vomiting, please contact the office immediatly for further recommendations for treatment.  ITCHING If you experience itching with your medications, try taking only a single pain pill, or even half a pain pill at a time.  You can also use Benadryl over the counter for itching or also to help with sleep.   MEDICATIONS See your medication summary on the "After Visit Summary" that the nursing staff will review with you prior to discharge.  You may have some home medications which will be placed on hold until you complete the course of blood thinner medication.  It is important for you to complete the blood thinner medication as prescribed by your surgeon.  Continue your approved medications as instructed at time of discharge.  PRECAUTIONS If you experience chest pain or shortness of breath - call 911 immediately for transfer to the hospital emergency department.  If you develop a fever greater that 101 F,   purulent drainage from wound, increased redness or drainage from wound, foul odor from the wound/dressing, or calf pain - CONTACT YOUR SURGEON.                                                   FOLLOW-UP APPOINTMENTS Make sure you keep all of your appointments after your operation with your surgeon and caregivers. You should call the office at the above phone number and make an appointment for approximately two weeks after the date of your surgery or on the date instructed by your surgeon outlined in the "After Visit Summary".  RANGE OF MOTION AND STRENGTHENING EXERCISES  Rehabilitation of the knee is important following a knee injury or an operation. After just a few days of immobilization, the muscles of the thigh which control the knee become weakened and shrink (atrophy). Knee exercises are designed to build up  the tone and strength of the thigh muscles and to improve knee motion. Often times heat used for twenty to thirty minutes before working out will loosen up your tissues and help with improving the range of motion but do not use heat for the first two weeks following surgery. These exercises can be done on a training (exercise) mat, on the floor, on a table or on a bed. Use what ever works the best and is most comfortable for you Knee exercises include:  Leg Lifts - While your knee is still immobilized in a splint or cast, you can do straight leg raises. Lift the leg to 60 degrees, hold for 3 sec, and slowly lower the leg. Repeat 10-20 times 2-3 times daily. Perform this exercise against resistance later as your knee gets better.  Quad and Hamstring Sets - Tighten up the muscle on the front of the thigh (Quad) and hold for 5-10 sec. Repeat this 10-20 times hourly. Hamstring sets are done by pushing the foot backward against an object and holding for 5-10 sec. Repeat as with quad sets.  Leg Slides: Lying on your back, slowly slide your foot toward your buttocks, bending your knee up off the floor (only go as far as is comfortable). Then slowly slide your foot back down until your leg is flat on the floor again. Angel Wings: Lying on your back spread your legs to the side as far apart as you can without causing discomfort.  A rehabilitation program following serious knee injuries can speed recovery and prevent re-injury in the future due to weakened muscles. Contact your doctor or a physical therapist for more information on knee rehabilitation.   POST-OPERATIVE OPIOID TAPER INSTRUCTIONS: It is important to wean off of your opioid medication as soon as possible. If you do not need pain medication after your surgery it is ok to stop day one. Opioids include: Codeine, Hydrocodone(Norco, Vicodin), Oxycodone(Percocet, oxycontin) and hydromorphone amongst others.  Long term and even short term use of opiods can  cause: Increased pain response Dependence Constipation Depression Respiratory depression And more.  Withdrawal symptoms can include Flu like symptoms Nausea, vomiting And more Techniques to manage these symptoms Hydrate well Eat regular healthy meals Stay active Use relaxation techniques(deep breathing, meditating, yoga) Do Not substitute Alcohol to help with tapering If you have been on opioids for less than two weeks and do not have pain than it is ok to stop all together.  Plan   to wean off of opioids This plan should start within one week post op of your joint replacement. Maintain the same interval or time between taking each dose and first decrease the dose.  Cut the total daily intake of opioids by one tablet each day Next start to increase the time between doses. The last dose that should be eliminated is the evening dose.   IF YOU ARE TRANSFERRED TO A SKILLED REHAB FACILITY If the patient is transferred to a skilled rehab facility following release from the hospital, a list of the current medications will be sent to the facility for the patient to continue.  When discharged from the skilled rehab facility, please have the facility set up the patient's Home Health Physical Therapy prior to being released. Also, the skilled facility will be responsible for providing the patient with their medications at time of release from the facility to include their pain medication, the muscle relaxants, and their blood thinner medication. If the patient is still at the rehab facility at time of the two week follow up appointment, the skilled rehab facility will also need to assist the patient in arranging follow up appointment in our office and any transportation needs.  MAKE SURE YOU:  Understand these instructions.  Get help right away if you are not doing well or get worse.   DENTAL ANTIBIOTICS:  In most cases prophylactic antibiotics for Dental procdeures after total joint surgery are  not necessary.  Exceptions are as follows:  1. History of prior total joint infection  2. Severely immunocompromised (Organ Transplant, cancer chemotherapy, Rheumatoid biologic medications such as Humera)  3. Poorly controlled diabetes (A1C &gt; 8.0, blood glucose over 200)  If you have one of these conditions, contact your surgeon for an antibiotic prescription, prior to your dental procedure.    Pick up stool softner and laxative for home use following surgery while on pain medications. Do not submerge incision under water. Please use good hand washing techniques while changing dressing each day. May shower starting three days after surgery. Please use a clean towel to pat the incision dry following showers. Continue to use ice for pain and swelling after surgery. Do not use any lotions or creams on the incision until instructed by your surgeon.  

## 2021-12-23 NOTE — Anesthesia Preprocedure Evaluation (Signed)
Anesthesia Evaluation  Patient identified by MRN, date of birth, ID band Patient awake    Reviewed: Allergy & Precautions, NPO status , Patient's Chart, lab work & pertinent test results  History of Anesthesia Complications (+) PONV and history of anesthetic complications  Airway Mallampati: II  TM Distance: >3 FB Neck ROM: Full    Dental no notable dental hx.    Pulmonary neg pulmonary ROS,    Pulmonary exam normal breath sounds clear to auscultation       Cardiovascular negative cardio ROS Normal cardiovascular exam Rhythm:Regular Rate:Normal     Neuro/Psych negative neurological ROS  negative psych ROS   GI/Hepatic Neg liver ROS, GERD  ,  Endo/Other  diabetes, Type 2  Renal/GU negative Renal ROS  negative genitourinary   Musculoskeletal negative musculoskeletal ROS (+)   Abdominal   Peds negative pediatric ROS (+)  Hematology negative hematology ROS (+)   Anesthesia Other Findings   Reproductive/Obstetrics negative OB ROS                             Anesthesia Physical Anesthesia Plan  ASA: 2  Anesthesia Plan: Spinal   Post-op Pain Management: Regional block*   Induction: Intravenous  PONV Risk Score and Plan: 2 and Ondansetron, Dexamethasone, Propofol infusion and Treatment may vary due to age or medical condition  Airway Management Planned: Simple Face Mask  Additional Equipment:   Intra-op Plan:   Post-operative Plan:   Informed Consent: I have reviewed the patients History and Physical, chart, labs and discussed the procedure including the risks, benefits and alternatives for the proposed anesthesia with the patient or authorized representative who has indicated his/her understanding and acceptance.     Dental advisory given  Plan Discussed with: CRNA and Surgeon  Anesthesia Plan Comments:         Anesthesia Quick Evaluation

## 2021-12-23 NOTE — Anesthesia Procedure Notes (Signed)
Procedure Name: MAC Date/Time: 12/23/2021 8:20 AM  Performed by: Bonney Aid, CRNAPre-anesthesia Checklist: Patient identified, Emergency Drugs available, Suction available, Patient being monitored and Timeout performed Oxygen Delivery Method: Simple face mask Placement Confirmation: positive ETCO2

## 2021-12-23 NOTE — Interval H&P Note (Signed)
History and Physical Interval Note:  12/23/2021 6:31 AM  Olivia Walker  has presented today for surgery, with the diagnosis of right knee osteoarthritis.  The various methods of treatment have been discussed with the patient and family. After consideration of risks, benefits and other options for treatment, the patient has consented to  Procedure(s): TOTAL KNEE ARTHROPLASTY (Right) as a surgical intervention.  The patient's history has been reviewed, patient examined, no change in status, stable for surgery.  I have reviewed the patient's chart and labs.  Questions were answered to the patient's satisfaction.     Homero Fellers Kathline Banbury

## 2021-12-23 NOTE — Progress Notes (Signed)
Assisted Dr. Okey Dupre with right, adductor canal block. Side rails up, monitors on throughout procedure. See vital signs in flow sheet. Tolerated Procedure well.

## 2021-12-23 NOTE — Transfer of Care (Signed)
Immediate Anesthesia Transfer of Care Note  Patient: Olivia Walker  Procedure(s) Performed: TOTAL KNEE ARTHROPLASTY (Right: Knee)  Patient Location: PACU  Anesthesia Type:Spinal  Level of Consciousness: awake, alert  and oriented  Airway & Oxygen Therapy: Patient Spontanous Breathing and Patient connected to face mask oxygen  Post-op Assessment: Report given to RN, T10 level   Post vital signs: Reviewed and stable  Last Vitals:  Vitals Value Taken Time  BP 102/61 12/23/21 0953  Temp    Pulse 68 12/23/21 0955  Resp 20 12/23/21 0955  SpO2 99 % 12/23/21 0955  Vitals shown include unvalidated device data.  Last Pain:  Vitals:   12/23/21 0627  TempSrc:   PainSc: 0-No pain      Patients Stated Pain Goal: 5 (12/23/21 5784)  Complications: No notable events documented.

## 2021-12-23 NOTE — TOC Transition Note (Signed)
Transition of Care [facility]) - CM/SW Discharge Note   Patient Details  Name: Olivia Walker MRN: [id number] Date of Birth: March 03, 1951  Transition of [facility] CM/SW Contact:  Lennart Pall, LCSW Phone Number: 12/23/2021, 2:05 PM   Clinical Narrative:    Met with pt and confirming she has all needed DME at home.  OPPT already set up with [facility].  No TOC needs.   Final next level of care: OP Rehab Barriers to Discharge: No Barriers Identified   Patient Goals and CMS Choice Patient states their goals for this hospitalization and ongoing recovery are:: return home      Discharge Placement                       Discharge Plan and Services                DME Arranged: N/A DME Agency: NA                  Social Determinants of Health (SDOH) Interventions     Readmission Risk Interventions     No data to display

## 2021-12-23 NOTE — Anesthesia Procedure Notes (Signed)
Spinal  Patient location during procedure: OR Start time: 12/23/2021 8:20 AM End time: 12/23/2021 8:24 AM Reason for block: surgical anesthesia Staffing Performed: anesthesiologist  Anesthesiologist: Eilene Ghazi, MD Performed by: Eilene Ghazi, MD Authorized by: Eilene Ghazi, MD   Preanesthetic Checklist Completed: patient identified, IV checked, site marked, risks and benefits discussed, surgical consent, monitors and equipment checked, pre-op evaluation and timeout performed Spinal Block Patient position: sitting Prep: Betadine Patient monitoring: heart rate, continuous pulse ox and blood pressure Approach: midline Location: L3-4 Injection technique: single-shot Needle Needle type: Sprotte  Needle gauge: 24 G Needle length: 9 cm Assessment Sensory level: T6 Events: CSF return Additional Notes

## 2021-12-23 NOTE — Anesthesia Postprocedure Evaluation (Signed)
Anesthesia Post Note  Patient: Olivia Walker  Procedure(s) Performed: TOTAL KNEE ARTHROPLASTY (Right: Knee)     Patient location during evaluation: PACU Anesthesia Type: Spinal Level of consciousness: oriented and awake and alert Pain management: pain level controlled Vital Signs Assessment: post-procedure vital signs reviewed and stable Respiratory status: spontaneous breathing, respiratory function stable and patient connected to nasal cannula oxygen Cardiovascular status: blood pressure returned to baseline and stable Postop Assessment: no headache, no backache and no apparent nausea or vomiting Anesthetic complications: no   No notable events documented.  Last Vitals:  Vitals:   12/23/21 0804 12/23/21 0955  BP:  102/61  Pulse: 69 66  Resp: 14 19  Temp:    SpO2: 98% 100%    Last Pain:  Vitals:   12/23/21 0955  TempSrc:   PainSc: 0-No pain                 Robertlee Rogacki S

## 2021-12-23 NOTE — Anesthesia Procedure Notes (Signed)
Anesthesia Procedure Image    

## 2021-12-23 NOTE — Progress Notes (Signed)
Orthopedic Tech Progress Note Patient Details:  DELANO SCARDINO 1951-04-25 [id number]  Patient ID: [name], female   DOB: June 13, 1950, 71 y.o.   MRN: [id number]  [name] 12/23/2021, 10:55 AM Cpm applied in pacu

## 2021-12-23 NOTE — Op Note (Signed)
OPERATIVE REPORT-TOTAL KNEE ARTHROPLASTY   Pre-operative diagnosis- Osteoarthritis  Right knee(s)  Post-operative diagnosis- Osteoarthritis Right knee(s)  Procedure-  Right  Total Knee Arthroplasty  Surgeon- Gus Rankin. Mitchelle Goerner, MD  Assistant- Arther Abbott, PA-C   Anesthesia-   Adductor canal block and spinal  EBL- 25 ml   Drains None  Tourniquet time-  Total Tourniquet Time Documented: Thigh (Right) - 35 minutes Total: Thigh (Right) - 35 minutes     Complications- None  Condition-PACU - hemodynamically stable.   Brief Clinical Note  Olivia Walker is a 71 y.o. year old female with end stage OA of her right knee with progressively worsening pain and dysfunction. She has constant pain, with activity and at rest and significant functional deficits with difficulties even with ADLs. She has had extensive non-op management including analgesics, injections of cortisone and viscosupplements, and home exercise program, but remains in significant pain with significant dysfunction.Radiographs show bone on bone arthritis lateral and patellofemoral. She presents now for right Total Knee Arthroplasty.     Procedure in detail---   The patient is brought into the operating room and positioned supine on the operating table. After successful administration of  Adductor canal block and spinal,   a tourniquet is placed high on the  Right thigh(s) and the lower extremity is prepped and draped in the usual sterile fashion. Time out is performed by the operating team and then the  Right lower extremity is wrapped in Esmarch, knee flexed and the tourniquet inflated to 300 mmHg.       A midline incision is made with a ten blade through the subcutaneous tissue to the level of the extensor mechanism. A fresh blade is used to make a medial parapatellar arthrotomy. Soft tissue over the proximal medial tibia is subperiosteally elevated to the joint line with a knife and into the semimembranosus bursa with a  Cobb elevator. Soft tissue over the proximal lateral tibia is elevated with attention being paid to avoiding the patellar tendon on the tibial tubercle. The patella is everted, knee flexed 90 degrees and the ACL and PCL are removed. Findings are bone on bone lateral and patellofemoral with large global osteophytes       The drill is used to create a starting hole in the distal femur and the canal is thoroughly irrigated with sterile saline to remove the fatty contents. The 5 degree Right  valgus alignment guide is placed into the femoral canal and the distal femoral cutting block is pinned to remove 9 mm off the distal femur. Resection is made with an oscillating saw.      The tibia is subluxed forward and the menisci are removed. The extramedullary alignment guide is placed referencing proximally at the medial aspect of the tibial tubercle and distally along the second metatarsal axis and tibial crest. The block is pinned to remove 54mm off the more deficient lateral  side. Resection is made with an oscillating saw. Size 5is the most appropriate size for the tibia and the proximal tibia is prepared with the modular drill and keel punch for that size.      The femoral sizing guide is placed and size 5 is most appropriate. Rotation is marked off the epicondylar axis and confirmed by creating a rectangular flexion gap at 90 degrees. The size 5 cutting block is pinned in this rotation and the anterior, posterior and chamfer cuts are made with the oscillating saw. The intercondylar block is then placed and that cut is made.  Trial size 5 tibial component, trial size 5 posterior stabilized femur and a 7  mm posterior stabilized rotating platform insert trial is placed. Full extension is achieved with excellent varus/valgus and anterior/posterior balance throughout full range of motion. The patella is everted and thickness measured to be 22  mm. Free hand resection is taken to 12 mm, a 38 template is placed, lug  holes are drilled, trial patella is placed, and it tracks normally. Osteophytes are removed off the posterior femur with the trial in place. All trials are removed and the cut bone surfaces prepared with pulsatile lavage. Cement is mixed and once ready for implantation, the size 5 tibial implant, size  5 posterior stabilized femoral component, and the size 38 patella are cemented in place and the patella is held with the clamp. The trial insert is placed and the knee held in full extension. The Exparel (20 ml mixed with 60 ml saline) is injected into the extensor mechanism, posterior capsule, medial and lateral gutters and subcutaneous tissues.  All extruded cement is removed and once the cement is hard the permanent 7 mm posterior stabilized rotating platform insert is placed into the tibial tray.      The wound is copiously irrigated with saline solution and the extensor mechanism closed with # 0 Stratofix suture. The tourniquet is released for a total tourniquet time of 35  minutes. Flexion against gravity is 140 degrees and the patella tracks normally. Subcutaneous tissue is closed with 2.0 vicryl and subcuticular with running 4.0 Monocryl. The incision is cleaned and dried and steri-strips and a bulky sterile dressing are applied. The limb is placed into a knee immobilizer and the patient is awakened and transported to recovery in stable condition.      Please note that a surgical assistant was a medical necessity for this procedure in order to perform it in a safe and expeditious manner. Surgical assistant was necessary to retract the ligaments and vital neurovascular structures to prevent injury to them and also necessary for proper positioning of the limb to allow for anatomic placement of the prosthesis.   Gus Rankin Diera Wirkkala, MD    12/23/2021, 9:27 AM

## 2021-12-23 NOTE — Anesthesia Procedure Notes (Signed)
Anesthesia Regional Block: Adductor canal block   Pre-Anesthetic Checklist: , timeout performed,  Correct Patient, Correct Site, Correct Laterality,  Correct Procedure, Correct Position, site marked,  Risks and benefits discussed,  Surgical consent,  Pre-op evaluation,  At surgeon's request and post-op pain management  Laterality: Right  Prep: chloraprep       Needles:  Injection technique: Single-shot  Needle Type: Echogenic Needle     Needle Length: 9cm      Additional Needles:   Procedures:,,,, ultrasound used (permanent image in chart),,    Narrative:  Start time: 12/23/2021 7:58 AM End time: 12/23/2021 8:04 AM Injection made incrementally with aspirations every 5 mL.  Performed by: Personally  Anesthesiologist: Eilene Ghazi, MD  Additional Notes: Patient tolerated the procedure well without complications

## 2021-12-23 NOTE — Evaluation (Signed)
Physical Therapy Evaluation Patient Details Name: Olivia Walker MRN: [id number] DOB: May 24, 1951 Today's Date: 12/23/2021  History of Present Illness  Pt is a 71yo female presenting s/p R-TKA on 12/23/21. PMH: chronic pain, anxiety & depression, DM, GERD, R-THA 2014.   Clinical Impression  Olivia Walker is a 71 y.o. female POD 0 s/p R-TKA. Patient reports IND with mobility at baseline. Patient is now limited by functional impairments (see PT problem list below) and requires supervision for bed mobility and min assist for transfers. Patient was able to ambulate 40 feet with RW and min guard level of assist. Patient instructed in exercise to facilitate ROM and circulation to manage edema. Provided incentive spirometer and with Vcs pt able to achieve . Patient will benefit from continued skilled PT interventions to address impairments and progress towards PLOF. Acute PT will follow to progress mobility and stair training in preparation for safe discharge home.       Recommendations for follow up therapy are one component of a multi-disciplinary discharge planning process, led by the attending physician.  Recommendations may be updated based on patient status, additional functional criteria and insurance authorization.  Follow Up Recommendations Follow physician's recommendations for discharge plan and follow up therapies      Assistance Recommended at Discharge Intermittent Supervision/Assistance  Patient can return home with the following  A little help with walking and/or transfers;A little help with bathing/dressing/bathroom;Assistance with cooking/housework;Help with stairs or ramp for entrance;Assist for transportation    Equipment Recommendations None recommended by PT  Recommendations for Other Services       Functional Status Assessment Patient has had a recent decline in their functional status and demonstrates the ability to make significant improvements in function in a reasonable  and predictable amount of time.     Precautions / Restrictions Precautions Precautions: Fall Restrictions Weight Bearing Restrictions: No RLE Weight Bearing: Weight bearing as tolerated      Mobility  Bed Mobility Overal bed mobility: Needs Assistance Bed Mobility: Supine to Sit     Supine to sit: Supervision     General bed mobility comments: Supervision for safety, no physical assist required    Transfers Overall transfer level: Needs assistance Equipment used: Rolling walker (2 wheels) Transfers: Sit to/from Stand Sit to Stand: From elevated surface, Min assist           General transfer comment: Min assist for steadying RW, VCs for placing BUE on bed and pushing up.    Ambulation/Gait Ambulation/Gait assistance: Min guard, +2 safety/equipment Gait Distance (Feet): 40 Feet Assistive device: Rolling walker (2 wheels) Gait Pattern/deviations: Step-to pattern Gait velocity: decreased     General Gait Details: Pt ambulated with RW and min guard, +2 for recliner follow, no physical assist rquired or overt LOB noted.  Stairs            Wheelchair Mobility    Modified Rankin (Stroke Patients Only)       Balance Overall balance assessment: Needs assistance Sitting-balance support: Feet supported, No upper extremity supported Sitting balance-Leahy Scale: Good     Standing balance support: Reliant on assistive device for balance, During functional activity, Bilateral upper extremity supported Standing balance-Leahy Scale: Poor                               Pertinent Vitals/Pain Pain Assessment Pain Assessment: 0-10 Pain Score: 5  Pain Location: right calf Pain Descriptors / Indicators: Aching, Operative site  guarding, Discomfort Pain Intervention(s): Limited activity within patient's tolerance, Monitored during session, Repositioned, Ice applied    Home Living Family/patient expects to be discharged to:: Private residence Living  Arrangements: Alone Available Help at Discharge: Friend(s);Available 24 hours/day Phillip Heal) Type of Home: House Home Access: Level entry       Home Layout: One level Home Equipment: Agricultural consultant (2 wheels);Wheelchair - manual;Grab bars - tub/shower;Grab bars - toilet;Cane - single point Additional Comments: Home environment describes friend Jeff's house where she will be staying    Prior Function Prior Level of Function : Independent/Modified Independent;Driving             Mobility Comments: IND ADLs Comments: IND     Hand Dominance        Extremity/Trunk Assessment   Upper Extremity Assessment Upper Extremity Assessment: Overall WFL for tasks assessed    Lower Extremity Assessment Lower Extremity Assessment: RLE deficits/detail;LLE deficits/detail RLE Deficits / Details: MMT ank DF/PF 5/5, no extensor lag noted RLE Sensation: WNL LLE Deficits / Details: MMT ank DF/PF 5/5 LLE Sensation: WNL    Cervical / Trunk Assessment Cervical / Trunk Assessment: Kyphotic  Communication   Communication: No difficulties  Cognition Arousal/Alertness: Awake/alert Behavior During Therapy: WFL for tasks assessed/performed Overall Cognitive Status: Within Functional Limits for tasks assessed                                          General Comments General comments (skin integrity, edema, etc.): Phillip Heal present for session    Exercises Total Joint Exercises Ankle Circles/Pumps: AROM, Both, 10 reps   Assessment/Plan    PT Assessment Patient needs continued PT services  PT Problem List Decreased strength;Decreased range of motion;Decreased activity tolerance;Decreased balance;Decreased mobility;Decreased coordination;Pain       PT Treatment Interventions DME instruction;Gait training;Stair training;Functional mobility training;Therapeutic activities;Therapeutic exercise;Balance training;Neuromuscular re-education;Patient/family education    PT  Goals (Current goals can be found in the Care Plan section)  Acute Rehab PT Goals Patient Stated Goal: To improve my quality of life PT Goal Formulation: With patient Time For Goal Achievement: 12/30/21 Potential to Achieve Goals: Good    Frequency 7X/week     Co-evaluation               AM-PAC PT "6 Clicks" Mobility  Outcome Measure Help needed turning from your back to your side while in a flat bed without using bedrails?: None Help needed moving from lying on your back to sitting on the side of a flat bed without using bedrails?: A Little Help needed moving to and from a bed to a chair (including a wheelchair)?: A Little Help needed standing up from a chair using your arms (e.g., wheelchair or bedside chair)?: A Little Help needed to walk in hospital room?: A Little Help needed climbing 3-5 steps with a railing? : A Lot 6 Click Score: 18    End of Session Equipment Utilized During Treatment: Gait belt Activity Tolerance: Patient tolerated treatment well Patient left: in chair;with call bell/phone within reach;with chair alarm set;with family/visitor present;with SCD's reapplied Nurse Communication: Mobility status;Patient requests pain meds PT Visit Diagnosis: Pain;Difficulty in walking, not elsewhere classified (R26.2) Pain - Right/Left: Right Pain - part of body: Knee    Time: 0109-3235 PT Time Calculation (min) (ACUTE ONLY): 25 min   Charges:   PT Evaluation $PT Eval Low Complexity: 1 Low PT Treatments $  Gait Training: 8-22 mins       Jamesetta Geralds, [place], DPT [facility] Department Office: [phone] Pager: [phone]  Jamesetta Geralds 12/23/2021, 2:57 PM

## 2021-12-24 DIAGNOSIS — M1711 Unilateral primary osteoarthritis, right knee: Secondary | ICD-10-CM | POA: Diagnosis not present

## 2021-12-24 LAB — BASIC METABOLIC PANEL
Anion gap: 9 (ref 5–15)
BUN: 13 mg/dL (ref 8–23)
CO2: 25 mmol/L (ref 22–32)
Calcium: 8.3 mg/dL — ABNORMAL LOW (ref 8.9–10.3)
Chloride: 103 mmol/L (ref 98–111)
Creatinine, Ser: 1.09 mg/dL — ABNORMAL HIGH (ref 0.44–1.00)
GFR, Estimated: 55 mL/min — ABNORMAL LOW (ref >60)
Glucose, Bld: 178 mg/dL — ABNORMAL HIGH (ref 70–99)
Potassium: 3.9 mmol/L (ref 3.5–5.1)
Sodium: 137 mmol/L (ref 135–145)

## 2021-12-24 LAB — CBC
HCT: 30.5 % — ABNORMAL LOW (ref 36.0–46.0)
Hemoglobin: 9.5 g/dL — ABNORMAL LOW (ref 12.0–15.0)
MCH: 26.8 pg (ref 26.0–34.0)
MCHC: 31.1 g/dL (ref 30.0–36.0)
MCV: 86.2 fL (ref 80.0–100.0)
Platelets: 221 10*3/uL (ref 150–400)
RBC: 3.54 MIL/uL — ABNORMAL LOW (ref 3.87–5.11)
RDW: 15.3 % (ref 11.5–15.5)
WBC: 12.3 10*3/uL — ABNORMAL HIGH (ref 4.0–10.5)
nRBC: 0 % (ref 0.0–0.2)

## 2021-12-24 LAB — GLUCOSE, CAPILLARY
Glucose-Capillary: 133 mg/dL — ABNORMAL HIGH (ref 70–99)
Glucose-Capillary: 147 mg/dL — ABNORMAL HIGH (ref 70–99)
Glucose-Capillary: 223 mg/dL — ABNORMAL HIGH (ref 70–99)
Glucose-Capillary: 175 mg/dL — ABNORMAL HIGH (ref 70–99)

## 2021-12-24 MED ORDER — METHOCARBAMOL 500 MG PO TABS
500.0000 mg | ORAL_TABLET | Freq: Four times a day (QID) | ORAL | 0 refills | Status: DC | PRN
Start: 2021-12-24 — End: 2023-08-21

## 2021-12-24 MED ORDER — OXYCODONE HCL 5 MG PO TABS: 5.0000 mg | ORAL_TABLET | Freq: Four times a day (QID) | ORAL | 0 refills | Status: DC | PRN

## 2021-12-24 MED ORDER — GABAPENTIN 300 MG PO CAPS: ORAL_CAPSULE | ORAL | 0 refills | Status: AC

## 2021-12-24 MED ORDER — ASPIRIN 325 MG PO TBEC: 325.0000 mg | DELAYED_RELEASE_TABLET | Freq: Two times a day (BID) | ORAL | 0 refills | Status: AC

## 2021-12-24 NOTE — Progress Notes (Signed)
Physical Therapy Treatment Patient Details Name: Olivia Walker MRN: [id number] DOB: May 26, 1951 Today's Date: 12/24/2021   History of Present Illness Pt is a 71yo female presenting s/p R-TKA on 12/23/21. PMH: chronic pain, anxiety & depression, DM, GERD, R-THA 2014.    PT Comments    POD # 1 am session General Comments: AxO x 3 having "more pain" today.  Assisted OOB to amb in hallway required increased time.  General bed mobility comments: demonstarted and instructed on how to use strap to self assist LE off and onto bed with increased time due to increased pain today. General Gait Details: decreased amb distance this session due to increased c/o pain. General stair comments: one step with 50% VC's on proper walker placement, proper sequencing as well as increased time to complete. Assisted back to bed per pt request.  Unable to tolerate any TE's due to pain level as well as increased c/o nausea.  Pt requesting Zofran (by name)  Pt will need another PT session to address HEP, increase her mobility plus pt has yet to void.   Recommendations for follow up therapy are one component of a multi-disciplinary discharge planning process, led by the attending physician.  Recommendations may be updated based on patient status, additional functional criteria and insurance authorization.  Follow Up Recommendations  Follow physician's recommendations for discharge plan and follow up therapies     Assistance Recommended at Discharge Intermittent Supervision/Assistance  Patient can return home with the following A little help with walking and/or transfers;A little help with bathing/dressing/bathroom;Assistance with cooking/housework;Help with stairs or ramp for entrance;Assist for transportation   Equipment Recommendations  None recommended by PT    Recommendations for Other Services       Precautions / Restrictions Precautions Precautions: Fall Precaution Comments: instructed no pillow under  knee Restrictions Weight Bearing Restrictions: No RLE Weight Bearing: Weight bearing as tolerated     Mobility  Bed Mobility Overal bed mobility: Needs Assistance Bed Mobility: Supine to Sit     Supine to sit: Min guard     General bed mobility comments: demonstarted and instructed on how to use strap to self assist LE off and onto bed with increased time due to increased pain today.    Transfers Overall transfer level: Needs assistance Equipment used: Rolling walker (2 wheels) Transfers: Sit to/from Stand Sit to Stand: From elevated surface, Min assist, Min guard           General transfer comment: had to elevate bed as pt struggled to rise from bed.    Ambulation/Gait Ambulation/Gait assistance: Min assist, Mod assist Gait Distance (Feet): 38 Feet Assistive device: Rolling walker (2 wheels) Gait Pattern/deviations: Step-to pattern Gait velocity: decreased     General Gait Details: decreased amb distance this session due to increased c/o pain.   Stairs Stairs: Yes Stairs assistance: Min guard Stair Management: No rails, Step to pattern, Forwards, With walker Number of Stairs: 1 General stair comments: one step with 50% VC's on proper walker placement, proper sequencing as well as increased time to complete.   Wheelchair Mobility    Modified Rankin (Stroke Patients Only)       Balance                                            Cognition Arousal/Alertness: Awake/alert Behavior During Therapy: WFL for tasks assessed/performed Overall Cognitive Status: Within  Functional Limits for tasks assessed                                 General Comments: AxO x 3 having "more pain" today        Exercises      General Comments        Pertinent Vitals/Pain Pain Assessment Pain Assessment: Faces Faces Pain Scale: Hurts little more Pain Location: R knee Pain Descriptors / Indicators: Aching, Operative site guarding,  Discomfort Pain Intervention(s): Monitored during session, Premedicated before session, Repositioned, Ice applied    Home Living                          Prior Function            PT Goals (current goals can now be found in the care plan section) Progress towards PT goals: Progressing toward goals    Frequency    7X/week      PT Plan Current plan remains appropriate    Co-evaluation              AM-PAC PT "6 Clicks" Mobility   Outcome Measure  Help needed turning from your back to your side while in a flat bed without using bedrails?: A Little Help needed moving from lying on your back to sitting on the side of a flat bed without using bedrails?: A Little Help needed moving to and from a bed to a chair (including a wheelchair)?: A Little Help needed standing up from a chair using your arms (e.g., wheelchair or bedside chair)?: A Little Help needed to walk in hospital room?: A Little Help needed climbing 3-5 steps with a railing? : A Lot 6 Click Score: 17    End of Session Equipment Utilized During Treatment: Gait belt Activity Tolerance: Patient limited by pain Patient left: in bed;with call bell/phone within reach;with bed alarm set Nurse Communication: Mobility status PT Visit Diagnosis: Pain;Difficulty in walking, not elsewhere classified (R26.2) Pain - Right/Left: Right Pain - part of body: Knee     Time: 0932-3557 PT Time Calculation (min) (ACUTE ONLY): 28 min  Charges:  $Gait Training: 8-22 mins $Therapeutic Activity: 8-22 mins                     {Liliana Dang  PTA Acute  Rehabilitation [employer] M-F          [phone] Weekend pager [phone]

## 2021-12-24 NOTE — Progress Notes (Signed)
Subjective: 1 Day Post-Op Procedure(s) (LRB): TOTAL KNEE ARTHROPLASTY (Right) Patient reports pain as mild.   Patient seen in rounds by Dr. Lequita Halt. Patient is well, and has had no acute complaints or problems No issues overnight. Denies chest pain, SOB, or calf pain. Foley catheter to be removed this AM.  We will continue therapy today, ambulated 40' yesterday.   Objective: Vital signs in last 24 hours: Temp:  [97.9 F (36.6 C)-98.6 F (37 C)] 98.6 F (37 C) (08/01 0543) Pulse Rate:  [51-78] 57 (08/01 0543) Resp:  [0-23] 18 (08/01 0543) BP: (102-156)/(61-96) 137/70 (08/01 0543) SpO2:  [92 %-100 %] 92 % (08/01 0543)  Intake/Output from previous day:  Intake/Output Summary (Last 24 hours) at 12/24/2021 0750 Last data filed at 12/24/2021 0600 Gross per 24 hour  Intake 3394.73 ml  Output 1710 ml  Net 1684.73 ml     Intake/Output this shift: No intake/output data recorded.  Labs: Recent Labs    12/24/21 0240  HGB 9.5*   Recent Labs    12/24/21 0240  WBC 12.3*  RBC 3.54*  HCT 30.5*  PLT 221   Recent Labs    12/24/21 0240  NA 137  K 3.9  CL 103  CO2 25  BUN 13  CREATININE 1.09*  GLUCOSE 178*  CALCIUM 8.3*   No results for input(s): "LABPT", "INR" in the last 72 hours.  Exam: General - Patient is Alert and Oriented Extremity - Neurologically intact Neurovascular intact Sensation intact distally Dorsiflexion/Plantar flexion intact Dressing - dressing C/D/I Motor Function - intact, moving foot and toes well on exam.   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    Diabetes mellitus without complication (HCC)    prediabetic   Dyspnea    GERD (gastroesophageal reflux disease)    Hemorrhoids    mild   High cholesterol    History of kidney stones    Impaired glucose tolerance    OA  (osteoarthritis)    hips, knees   Obesity    PONV (postoperative nausea and vomiting)    Stones in the urinary tract     Assessment/Plan: 1 Day Post-Op Procedure(s) (LRB): TOTAL KNEE ARTHROPLASTY (Right) Principal Problem:   OA (osteoarthritis) of knee Active Problems:   Osteoarthritis of right knee  Estimated body mass index is 36.93 kg/m as calculated from the following:   Height as of this encounter: 5\' 4"  (1.626 m).   Weight as of this encounter: 97.6 kg. Advance diet Up with therapy D/C IV fluids   Patient's anticipated LOS is less than 2 midnights, meeting these requirements: - Lives within 1 hour of care - Has a competent adult at home to recover with post-op recover - NO history of  - Chronic pain requiring opioids  - Coronary Artery Disease  - Heart failure  - Heart attack  - Stroke  - DVT/VTE  - Cardiac arrhythmia  - Respiratory Failure/COPD  - Renal failure  - Anemia  - Advanced Liver disease  DVT Prophylaxis - Aspirin Weight bearing as tolerated. Continue therapy.  Plan is to go Home after hospital stay. Plan for discharge later today if progresses with therapy and meeting goals. Scheduled for OPPT at [facility]. Follow-up in the office in 2 weeks.  The PDMP database was reviewed today  prior to any opioid medications being prescribed to this patient.  Arther Abbott, PA-C Orthopedic Surgery [phone] 12/24/2021, 7:50 AM

## 2021-12-24 NOTE — Progress Notes (Signed)
Physical Therapy Treatment Patient Details Name: Olivia Walker MRN: [id number] DOB: 1951-03-08 Today's Date: 12/24/2021   History of Present Illness Pt is a 71yo female presenting s/p R-TKA on 12/23/21. PMH: chronic pain, anxiety & depression, DM, GERD, R-THA 2014.    PT Comments    POD # 1 pm session Pt having increased pain today.  RN assisted to bathroom about 1:45 pm in which pt " barley was able to walk".  Pt seen in bed.  Performed bed level TE's however pt kept falling asleep.  Pt was given 2 my IV Morphine at about 1 pm.  Placed pt in CPM 10 - 45 degrees. Bed alarm active.  Pt has NOT met her mobility goals to safely D/C to home today.     Recommendations for follow up therapy are one component of a multi-disciplinary discharge planning process, led by the attending physician.  Recommendations may be updated based on patient status, additional functional criteria and insurance authorization.  Follow Up Recommendations  Follow physician's recommendations for discharge plan and follow up therapies     Assistance Recommended at Discharge Intermittent Supervision/Assistance  Patient can return home with the following A little help with walking and/or transfers;A little help with bathing/dressing/bathroom;Assistance with cooking/housework;Help with stairs or ramp for entrance;Assist for transportation   Equipment Recommendations  None recommended by PT    Recommendations for Other Services       Precautions / Restrictions Precautions Precautions: Fall Precaution Comments: instructed no pillow under knee Restrictions Weight Bearing Restrictions: No RLE Weight Bearing: Weight bearing as tolerated     Mobility     Wheelchair Mobility    Modified Rankin (Stroke Patients Only)       Balance                                            Cognition Arousal/Alertness: Awake/alert Behavior During Therapy: WFL for tasks assessed/performed Overall Cognitive  Status: Within Functional Limits for tasks assessed                                 General Comments: AxO x 3 having "more pain" today        Exercises  Total Knee Replacement TE's following HEP handout 10 reps B LE ankle pumps 05 reps towel squeezes 05 reps knee presses 05 reps heel slides   Educated on use of gait belt to assist with TE's Followed by ICE     General Comments        Pertinent Vitals/Pain Pain Assessment Pain Assessment: Faces Faces Pain Scale: Hurts even more Pain Location: R knee Pain Descriptors / Indicators: Aching, Operative site guarding, Discomfort Pain Intervention(s): Monitored during session, Repositioned, Ice applied, Premedicated before session    Home Living                          Prior Function            PT Goals (current goals can now be found in the care plan section) Progress towards PT goals: Progressing toward goals    Frequency    7X/week      PT Plan Current plan remains appropriate    Co-evaluation              AM-PAC PT "6  Clicks" Mobility   Outcome Measure  Help needed turning from your back to your side while in a flat bed without using bedrails?: A Little Help needed moving from lying on your back to sitting on the side of a flat bed without using bedrails?: A Little Help needed moving to and from a bed to a chair (including a wheelchair)?: A Little Help needed standing up from a chair using your arms (e.g., wheelchair or bedside chair)?: A Little Help needed to walk in hospital room?: A Little Help needed climbing 3-5 steps with a railing? : A Lot 6 Click Score: 17    End of Session Equipment Utilized During Treatment: Gait belt Activity Tolerance: Patient limited by pain Patient left: in bed;with call bell/phone within reach;with bed alarm set Nurse Communication: Mobility status PT Visit Diagnosis: Pain;Difficulty in walking, not elsewhere classified (R26.2) Pain -  Right/Left: Right Pain - part of body: Knee     Time: 1452-1510 PT Time Calculation (min) (ACUTE ONLY): 18 min  Charges:  $Therapeutic Exercise: 8-22 mins                      {Yale Golla  PTA Acute  Rehabilitation Services Office M-F          [phone] Weekend pager [phone]

## 2021-12-25 ENCOUNTER — Encounter (HOSPITAL_COMMUNITY): Payer: Self-pay | Admitting: Orthopedic Surgery

## 2021-12-25 DIAGNOSIS — M1711 Unilateral primary osteoarthritis, right knee: Secondary | ICD-10-CM | POA: Diagnosis not present

## 2021-12-25 DIAGNOSIS — R262 Difficulty in walking, not elsewhere classified: Secondary | ICD-10-CM | POA: Insufficient documentation

## 2021-12-25 LAB — CBC
HCT: 32.9 % — ABNORMAL LOW (ref 36.0–46.0)
Hemoglobin: 10.4 g/dL — ABNORMAL LOW (ref 12.0–15.0)
MCH: 27.5 pg (ref 26.0–34.0)
MCHC: 31.6 g/dL (ref 30.0–36.0)
MCV: 87 fL (ref 80.0–100.0)
Platelets: 228 10*3/uL (ref 150–400)
RBC: 3.78 MIL/uL — ABNORMAL LOW (ref 3.87–5.11)
RDW: 15.6 % — ABNORMAL HIGH (ref 11.5–15.5)
WBC: 12.4 10*3/uL — ABNORMAL HIGH (ref 4.0–10.5)
nRBC: 0 % (ref 0.0–0.2)

## 2021-12-25 LAB — GLUCOSE, CAPILLARY
Glucose-Capillary: 184 mg/dL — ABNORMAL HIGH (ref 70–99)
Glucose-Capillary: 243 mg/dL — ABNORMAL HIGH (ref 70–99)
Glucose-Capillary: 169 mg/dL — ABNORMAL HIGH (ref 70–99)
Glucose-Capillary: 185 mg/dL — ABNORMAL HIGH (ref 70–99)

## 2021-12-25 NOTE — Progress Notes (Signed)
Orthopedic Tech Progress Note Patient Details:  Olivia Walker 23-Nov-1950 [id number]  Patient ID: [name], female   DOB: [birth year], 71 y.o.   MRN: [id number]  [name] 12/25/2021, 4:21 PM Cpm applied

## 2021-12-25 NOTE — Progress Notes (Signed)
   Subjective: 2 Days Post-Op Procedure(s) (LRB): TOTAL KNEE ARTHROPLASTY (Right) Patient reports pain as moderate.   Patient seen in rounds by Dr. Lequita Halt. Patient was doing very well yesterday morning, has had issues with pain control since block wore off. Reviewed med report, has not been maxing out pain medication so will keep regimen as is. Denies chest pain or SOB. Voiding without difficulty.  Plan is to go Home after hospital stay.  Objective: Vital signs in last 24 hours: Temp:  [97.8 F (36.6 C)-100.7 F (38.2 C)] 100.7 F (38.2 C) (08/02 0545) Pulse Rate:  [70-96] 95 (08/02 0545) Resp:  [16-18] 18 (08/02 0545) BP: (130-162)/(54-81) 146/71 (08/02 0545) SpO2:  [89 %-94 %] 89 % (08/02 0545)  Intake/Output from previous day:  Intake/Output Summary (Last 24 hours) at 12/25/2021 0810 Last data filed at 12/25/2021 0600 Gross per 24 hour  Intake 571.84 ml  Output 150 ml  Net 421.84 ml    Intake/Output this shift: No intake/output data recorded.  Labs: Recent Labs    12/24/21 0240 12/25/21 0217  HGB 9.5* 10.4*   Recent Labs    12/24/21 0240 12/25/21 0217  WBC 12.3* 12.4*  RBC 3.54* 3.78*  HCT 30.5* 32.9*  PLT 221 228   Recent Labs    12/24/21 0240  NA 137  K 3.9  CL 103  CO2 25  BUN 13  CREATININE 1.09*  GLUCOSE 178*  CALCIUM 8.3*   No results for input(s): "LABPT", "INR" in the last 72 hours.  Exam: General - Patient is Alert and Oriented Extremity - Neurologically intact Neurovascular intact Sensation intact distally Dorsiflexion/Plantar flexion intact Dressing/Incision - clean, dry, no drainage. Bulky dressing removed, aquacel in place.  Motor Function - intact, moving foot and toes well on exam.   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    Diabetes mellitus without complication (HCC)     prediabetic   Dyspnea    GERD (gastroesophageal reflux disease)    Hemorrhoids    mild   High cholesterol    History of kidney stones    Impaired glucose tolerance    OA (osteoarthritis)    hips, knees   Obesity    PONV (postoperative nausea and vomiting)    Stones in the urinary tract     Assessment/Plan: 2 Days Post-Op Procedure(s) (LRB): TOTAL KNEE ARTHROPLASTY (Right) Principal Problem:   OA (osteoarthritis) of knee Active Problems:   Osteoarthritis of right knee  Estimated body mass index is 36.93 kg/m as calculated from the following:   Height as of this encounter: 5\' 4"  (1.626 m).   Weight as of this encounter: 97.6 kg. Up with therapy  DVT Prophylaxis - Aspirin Weight-bearing as tolerated  Discharge later today if pain control improves and patient is cleared with PT.  , PA-C Orthopedic Surgery [phone] 12/25/2021, 8:10 AM

## 2021-12-25 NOTE — Progress Notes (Signed)
Physical Therapy Treatment Patient Details Name: Olivia Walker MRN: [id number] DOB: Dec 25, 1950 Today's Date: 12/25/2021   History of Present Illness Pt is a 71yo female presenting s/p R-TKA on 12/23/21. PMH: chronic pain, anxiety & depression, DM, GERD, R-THA 2014.    PT Comments    POD # 2 pm session Had Pt's friend Trey Paula come in for a PT session as he will be helping her at home.  General transfer comment: this pm session, pt was unable to stand from bed with + 1 assist.  Had her friend Trey Paula attempt as pt is scheduled to go home today.  Pt kept scooting forward with near slide onto floor.  Called for + 2 assist to get pt's hip back onto bed then lay her down.  General bed mobility comments: pt self able to transition to EOB with increased time and use of belt to support LE.  Pt c/o "other" knee "giving way" and MAX c/o fatigue/weakness.  Pt also exhibiting difficulty with coordination and cognitive focus.    ? Pain meds  Pt did NOT meet her mobility goals to safely D/C to home today.  Reported to RN.   Recommendations for follow up therapy are one component of a multi-disciplinary discharge planning process, led by the attending physician.  Recommendations may be updated based on patient status, additional functional criteria and insurance authorization.  Follow Up Recommendations  Follow physician's recommendations for discharge plan and follow up therapies     Assistance Recommended at Discharge Intermittent Supervision/Assistance  Patient can return home with the following A little help with walking and/or transfers;A little help with bathing/dressing/bathroom;Assistance with cooking/housework;Help with stairs or ramp for entrance;Assist for transportation   Equipment Recommendations  None recommended by PT    Recommendations for Other Services       Precautions / Restrictions Precautions Precautions: Fall Precaution Comments: instructed no pillow under knee Restrictions Weight  Bearing Restrictions: No RLE Weight Bearing: Weight bearing as tolerated Other Position/Activity Restrictions: AxO x 3 very pleasant lady but c/o feeling "tired" and "drunk".  Pt stated, "I try to text and my finger wont's work". Present with mild tremor.     Mobility  Bed Mobility Overal bed mobility: Needs Assistance Bed Mobility: Supine to Sit           General bed mobility comments: pt self able to transition to EOB with increased time and use of belt to support LE    Transfers Overall transfer level: Needs assistance Equipment used: Rolling walker (2 wheels) Transfers: Sit to/from Stand Sit to Stand: From elevated surface, Max assist, Total assist, +2 physical assistance, +2 safety/equipment           General transfer comment: this pm session, pt was unable to stand from bed with + 1 assist.  Had her friend Trey Paula attempt as pt is scheduled to go home today.  Pt kept scooting forward with near slide onto floor.  Called for + 2 assist to get pt's hip back onto bed then lay her down.    Ambulation/Gait     General Gait Details: unable due to max instability and lack of coordination.   Stairs             Wheelchair Mobility    Modified Rankin (Stroke Patients Only)       Balance  Cognition Arousal/Alertness: Awake/alert Behavior During Therapy: WFL for tasks assessed/performed Overall Cognitive Status: Within Functional Limits for tasks assessed                                 General Comments: AxO x 3 having difficulty getting OOB and c/o MAX fatigue.        Exercises      General Comments        Pertinent Vitals/Pain Pain Assessment Pain Assessment: Faces Pain Score: 7  Faces Pain Scale: Hurts little more Pain Location: R knee Pain Descriptors / Indicators: Aching, Operative site guarding, Discomfort Pain Intervention(s): Monitored during session, Premedicated  before session, Repositioned, Ice applied    Home Living                          Prior Function            PT Goals (current goals can now be found in the care plan section) Progress towards PT goals: Progressing toward goals    Frequency    7X/week      PT Plan Current plan remains appropriate    Co-evaluation              AM-PAC PT "6 Clicks" Mobility   Outcome Measure  Help needed turning from your back to your side while in a flat bed without using bedrails?: A Lot Help needed moving from lying on your back to sitting on the side of a flat bed without using bedrails?: A Lot Help needed moving to and from a bed to a chair (including a wheelchair)?: A Lot Help needed standing up from a chair using your arms (e.g., wheelchair or bedside chair)?: A Lot Help needed to walk in hospital room?: Total Help needed climbing 3-5 steps with a railing? : Total 6 Click Score: 10    End of Session Equipment Utilized During Treatment: Gait belt Activity Tolerance: Patient limited by pain;Patient limited by fatigue Patient left: in bed;with call bell/phone within reach;with chair alarm set Nurse Communication: Mobility status PT Visit Diagnosis: Pain;Difficulty in walking, not elsewhere classified (R26.2) Pain - Right/Left: Right Pain - part of body: Knee     Time: 1522-1550 PT Time Calculation (min) (ACUTE ONLY): 28 min  Charges:  $Therapeutic Activity: 23-37 mins                     {Lanson Randle  PTA Acute  Rehabilitation Services Office M-F          [phone] Weekend pager [phone]

## 2021-12-25 NOTE — Plan of Care (Signed)
  Problem: Activity: Goal: Risk for activity intolerance will decrease Outcome: Progressing   Problem: Pain Managment: Goal: General experience of comfort will improve Outcome: Progressing   Problem: Safety: Goal: Ability to remain free from injury will improve Outcome: Progressing   

## 2021-12-25 NOTE — Progress Notes (Signed)
24 hour chart audit completed 

## 2021-12-25 NOTE — Plan of Care (Signed)
  Problem: Fluid Volume: Goal: Ability to maintain a balanced intake and output will improve Outcome: Progressing   

## 2021-12-25 NOTE — Progress Notes (Signed)
Physical Therapy Treatment Patient Details Name: Olivia Walker MRN: [id number] DOB: 11-01-1950 Today's Date: 12/25/2021   History of Present Illness Pt is a 71yo female presenting s/p R-TKA on 12/23/21. PMH: chronic pain, anxiety & depression, DM, GERD, R-THA 2014.    PT Comments    POD # 2 am session General bed mobility comments: pt self able to transition to EOB with increased time and use of belt to support LE   General transfer comment: had to elevate bed as pt struggled to rise from bed.  Had to call for + 2 assist.  Also assisted with an elevated toilet transfer.  Pt was able to rise at [facility]. General Gait Details: limited amb distance to and from bathroom 12 feet x 2 with increased time. "other" knee is "bad".  Distance limited by effort/fatigue/increased pain level. Pt will need another PT session to practice one step and increase her safety with amb.   Recommendations for follow up therapy are one component of a multi-disciplinary discharge planning process, led by the attending physician.  Recommendations may be updated based on patient status, additional functional criteria and insurance authorization.  Follow Up Recommendations  Follow physician's recommendations for discharge plan and follow up therapies     Assistance Recommended at Discharge Intermittent Supervision/Assistance  Patient can return home with the following A little help with walking and/or transfers;A little help with bathing/dressing/bathroom;Assistance with cooking/housework;Help with stairs or ramp for entrance;Assist for transportation   Equipment Recommendations  None recommended by PT    Recommendations for Other Services       Precautions / Restrictions Precautions Precautions: Fall Precaution Comments: instructed no pillow under knee Restrictions Weight Bearing Restrictions: No RLE Weight Bearing: Weight bearing as tolerated Other Position/Activity Restrictions: AxO x 3 pleasant Lady  feeling "a little better" but "can't believe how much this hurts".     Mobility  Bed Mobility Overal bed mobility: Needs Assistance Bed Mobility: Supine to Sit           General bed mobility comments: pt self able to transition to EOB with increased time and use of belt to support LE    Transfers Overall transfer level: Needs assistance Equipment used: Rolling walker (2 wheels) Transfers: Sit to/from Stand Sit to Stand: From elevated surface, Min assist, Min guard           General transfer comment: had to elevate bed as pt struggled to rise from bed.  Had to call for + 2 assist.  Also assisted with an elevated toilet transfer.  Pt was able to rise at [facility].    Ambulation/Gait Ambulation/Gait assistance: Min assist Gait Distance (Feet): 24 Feet Assistive device: Rolling walker (2 wheels) Gait Pattern/deviations: Step-to pattern Gait velocity: decreased     General Gait Details: limited amb distance to and from bathroom 12 feet x 2 with increased time. "other" knee is "bad".  Distance limited by effort/fatigue/increased pain level.   Stairs             Wheelchair Mobility    Modified Rankin (Stroke Patients Only)       Balance                                            Cognition Arousal/Alertness: Awake/alert Behavior During Therapy: WFL for tasks assessed/performed Overall Cognitive Status: Within Functional Limits for tasks assessed  Exercises      General Comments        Pertinent Vitals/Pain Pain Assessment Pain Assessment: 0-10 Pain Score: 7  Pain Location: R knee Pain Descriptors / Indicators: Aching, Operative site guarding, Discomfort Pain Intervention(s): Monitored during session, Repositioned, Premedicated before session, Ice applied    Home Living                          Prior Function            PT Goals (current goals can  now be found in the care plan section) Progress towards PT goals: Progressing toward goals    Frequency    7X/week      PT Plan Current plan remains appropriate    Co-evaluation              AM-PAC PT "6 Clicks" Mobility   Outcome Measure  Help needed turning from your back to your side while in a flat bed without using bedrails?: A Little Help needed moving from lying on your back to sitting on the side of a flat bed without using bedrails?: A Little Help needed moving to and from a bed to a chair (including a wheelchair)?: A Little Help needed standing up from a chair using your arms (e.g., wheelchair or bedside chair)?: A Little Help needed to walk in hospital room?: A Little Help needed climbing 3-5 steps with a railing? : A Lot 6 Click Score: 17    End of Session Equipment Utilized During Treatment: Gait belt Activity Tolerance: Patient limited by pain Patient left: in chair;with call bell/phone within reach Nurse Communication: Mobility status PT Visit Diagnosis: Pain;Difficulty in walking, not elsewhere classified (R26.2) Pain - Right/Left: Right Pain - part of body: Knee     Time: 0910-0950 PT Time Calculation (min) (ACUTE ONLY): 40 min  Charges:  $Gait Training: 8-22 mins $Therapeutic Activity: 23-37 mins                     Felecia Shelling  PTA Acute  Rehabilitation Services Office M-F          [phone] Weekend pager [phone]

## 2021-12-26 DIAGNOSIS — M1711 Unilateral primary osteoarthritis, right knee: Secondary | ICD-10-CM | POA: Diagnosis not present

## 2021-12-26 LAB — GLUCOSE, CAPILLARY
Glucose-Capillary: 162 mg/dL — ABNORMAL HIGH (ref 70–99)
Glucose-Capillary: 174 mg/dL — ABNORMAL HIGH (ref 70–99)
Glucose-Capillary: 186 mg/dL — ABNORMAL HIGH (ref 70–99)
Glucose-Capillary: 217 mg/dL — ABNORMAL HIGH (ref 70–99)

## 2021-12-26 MED ORDER — ACETAMINOPHEN 325 MG PO TABS
650.0000 mg | ORAL_TABLET | Freq: Four times a day (QID) | ORAL | Status: DC | PRN
Start: 2021-12-26 — End: 2021-12-27
  Administered 2021-12-26: 650 mg via ORAL
  Filled 2021-12-26: qty 2

## 2021-12-26 NOTE — Progress Notes (Signed)
Physical Therapy Treatment Patient Details Name: Olivia Walker MRN: [id number] DOB: [birth year] Today's Date: 12/26/2021   History of Present Illness Pt is a 71yo female presenting s/p R-TKA on 12/23/21. PMH: chronic pain, anxiety & depression, DM, GERD, R-THA 2014.    PT Comments    POD # 3 am session Pt progressing slowly with issues of "other" knee weakness/pain as well as fatigue with limited activity tolerance.  Pt lives alone but plans to stay at a friends house([place]).  Pt also c/o "bad" shoulder and wrist.  Getting OOB to amb a functional distance is still difficult for her.  Sit to stand is her greatest difficulty.   General transfer comment: this is were pt struggles to self perform.  Unable to push up from bed she prefers to use forward weight shift momentum and pull self up with hands already on walker.  Required MAX Assist with HIGH FALL risk to slide off side bed.  Pt reports weakness in her "other knee". General Gait Details: pt was only able to amb to and from bathroom due to fatigue/effort.  Balance is slightly improved from yesterday as pt is only taking 5 vs 10 MG Oxy.  Still slow and sluggish.  HIGH FALL RISK. Pt has NOT met safe mobility goals.  Will need to see again this afternoon with her friend Merry Proud.   Recommendations for follow up therapy are one component of a multi-disciplinary discharge planning process, led by the attending physician.  Recommendations may be updated based on patient status, additional functional criteria and insurance authorization.  Follow Up Recommendations  Follow physician's recommendations for discharge plan and follow up therapies     Assistance Recommended at Discharge Intermittent Supervision/Assistance  Patient can return home with the following A little help with walking and/or transfers;A little help with bathing/dressing/bathroom;Assistance with cooking/housework;Help with stairs or ramp for entrance;Assist for transportation   Equipment  Recommendations  None recommended by PT    Recommendations for Other Services       Precautions / Restrictions Precautions Precautions: Fall Precaution Comments: instructed no pillow under knee Restrictions Weight Bearing Restrictions: No RLE Weight Bearing: Weight bearing as tolerated Other Position/Activity Restrictions: AxO x 3 very pleasant lady but c/o feeling "tired.  Pt reports "i have been dealingwith fatigue for quite some time".  Reports COVID last July.     Mobility  Bed Mobility Overal bed mobility: Needs Assistance Bed Mobility: Supine to Sit     Supine to sit: Min guard     General bed mobility comments: pt self able to transition to EOB with increased time and use of belt to support LE    Transfers Overall transfer level: Needs assistance Equipment used: Rolling walker (2 wheels)   Sit to Stand: From elevated surface, Max assist, Total assist, +2 physical assistance, +2 safety/equipment           General transfer comment: this is were pt struggles to self perform.  Unable to push up from bed she prefers to use forward weight shift momentum and pull self up with hands already on walker.  Required MAX Assist with HIGH FALL risk to slide off side bed.  Pt reports weakness in her "other knee".    Ambulation/Gait Ambulation/Gait assistance: Min assist Gait Distance (Feet): 16 Feet Assistive device: Rolling walker (2 wheels) Gait Pattern/deviations: Step-to pattern Gait velocity: decreased     General Gait Details: pt was only able to amb to and from bathroom due to fatigue/effort.  Balance is slightly  improved from yesterday as pt is only taking 5 vs 10 MG Oxy.  Still slow and sluggish.  HIGH FALL RISK.   Stairs             Wheelchair Mobility    Modified Rankin (Stroke Patients Only)       Balance                                            Cognition Arousal/Alertness: Awake/alert Behavior During Therapy: WFL for  tasks assessed/performed Overall Cognitive Status: Within Functional Limits for tasks assessed                                 General Comments: AxO x 3 having difficulty getting OOB and c/o MAX fatigue.        Exercises      General Comments        Pertinent Vitals/Pain Pain Assessment Pain Assessment: Faces Faces Pain Scale: Hurts even more Pain Location: R knee Pain Descriptors / Indicators: Aching, Operative site guarding, Discomfort Pain Intervention(s): Monitored during session, Repositioned, Premedicated before session    Home Living                          Prior Function            PT Goals (current goals can now be found in the care plan section) Progress towards PT goals: Progressing toward goals    Frequency    7X/week      PT Plan Current plan remains appropriate    Co-evaluation              AM-PAC PT "6 Clicks" Mobility   Outcome Measure  Help needed turning from your back to your side while in a flat bed without using bedrails?: A Lot Help needed moving from lying on your back to sitting on the side of a flat bed without using bedrails?: A Lot Help needed moving to and from a bed to a chair (including a wheelchair)?: A Lot Help needed standing up from a chair using your arms (e.g., wheelchair or bedside chair)?: A Lot Help needed to walk in hospital room?: A Lot Help needed climbing 3-5 steps with a railing? : Total 6 Click Score: 11    End of Session Equipment Utilized During Treatment: Gait belt Activity Tolerance: Patient limited by pain;Patient limited by fatigue Patient left: in chair;with call bell/phone within reach;with chair alarm set Nurse Communication: Mobility status PT Visit Diagnosis: Pain;Difficulty in walking, not elsewhere classified (R26.2) Pain - Right/Left: Right Pain - part of body: Knee     Time: 0375-4360 PT Time Calculation (min) (ACUTE ONLY): 32 min  Charges:  $Gait Training:  8-22 mins $Therapeutic Activity: 8-22 mins                     Rica Koyanagi  PTA [place] Office M-F          [phone] Weekend pager [phone]

## 2021-12-26 NOTE — Progress Notes (Signed)
   Subjective: 3 Days Post-Op Procedure(s) (LRB): TOTAL KNEE ARTHROPLASTY (Right) Patient seen in rounds by Dr. Lequita Halt. Patient is well, and has had no acute complaints or problems. Denies SOB or chest pain. Denies calf pain. Patient reports pain as mild. Pain has improved since yesterday although she states she feels weak. She worked with physical therapy and ambulated about 24 feet in the first session but was feeling fatigued in her second session. Will continue with physical therapy today.  Objective: Vital signs in last 24 hours: Temp:  [98 F (36.7 C)-99.4 F (37.4 C)] 98 F (36.7 C) (08/03 0547) Pulse Rate:  [82-100] 98 (08/03 0547) Resp:  [16-17] 17 (08/03 0547) BP: (145-160)/(71-96) 146/96 (08/03 0547) SpO2:  [91 %-99 %] 99 % (08/03 0547)  Intake/Output from previous day:  Intake/Output Summary (Last 24 hours) at 12/26/2021 0712 Last data filed at 12/26/2021 0700 Gross per 24 hour  Intake 420 ml  Output 1150 ml  Net -730 ml    Intake/Output this shift: No intake/output data recorded.  Labs: Recent Labs    12/24/21 0240 12/25/21 0217  HGB 9.5* 10.4*   Recent Labs    12/24/21 0240 12/25/21 0217  WBC 12.3* 12.4*  RBC 3.54* 3.78*  HCT 30.5* 32.9*  PLT 221 228   Recent Labs    12/24/21 0240  NA 137  K 3.9  CL 103  CO2 25  BUN 13  CREATININE 1.09*  GLUCOSE 178*  CALCIUM 8.3*   No results for input(s): "LABPT", "INR" in the last 72 hours.  Exam: General - Patient is Alert and Oriented Extremity - Neurologically intact Neurovascular intact Sensation intact distally Dorsiflexion/Plantar flexion intact Dressing/Incision - clean, dry, no drainage Motor Function - intact, moving foot and toes well on exam.  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    Diabetes mellitus without complication (HCC)     prediabetic   Dyspnea    GERD (gastroesophageal reflux disease)    Hemorrhoids    mild   High cholesterol    History of kidney stones    Impaired glucose tolerance    OA (osteoarthritis)    hips, knees   Obesity    PONV (postoperative nausea and vomiting)    Stones in the urinary tract     Assessment/Plan: 3 Days Post-Op Procedure(s) (LRB): TOTAL KNEE ARTHROPLASTY (Right) Principal Problem:   OA (osteoarthritis) of knee Active Problems:   Osteoarthritis of right knee  Estimated body mass index is 36.93 kg/m as calculated from the following:   Height as of this encounter: 5\' 4"  (1.626 m).   Weight as of this encounter: 97.6 kg.  DVT Prophylaxis - Aspirin Weight-bearing as tolerated.  Continue with physical therapy today. Expected discharge today pending progress with physical therapy. Scheduled for OPPT at [facility]. Follow-up in clinic in 2 weeks. Scripts have already been sent in.  [facility], PA-C Orthopedic Surgery [phone] 12/26/2021, 7:12 AM

## 2021-12-26 NOTE — Plan of Care (Signed)
  Problem: Education: Goal: Ability to describe self-care measures that may prevent or decrease complications (Diabetes Survival Skills Education) will improve Outcome: Progressing   Problem: Coping: Goal: Ability to adjust to condition or change in health will improve Outcome: Progressing   Problem: Fluid Volume: Goal: Ability to maintain a balanced intake and output will improve Outcome: Progressing   Problem: Health Behavior/Discharge Planning: Goal: Ability to identify and utilize available resources and services will improve Outcome: Progressing   Problem: Skin Integrity: Goal: Risk for impaired skin integrity will decrease Outcome: Progressing   Problem: Education: Goal: Knowledge of General Education information will improve Description: Including pain rating scale, medication(s)/side effects and non-pharmacologic comfort measures Outcome: Progressing   Problem: Health Behavior/Discharge Planning: Goal: Ability to manage health-related needs will improve Outcome: Progressing

## 2021-12-26 NOTE — Progress Notes (Signed)
Physical Therapy Treatment Patient Details Name: Olivia Walker MRN: [id number] DOB: November 07, 1950 Today's Date: 12/26/2021   History of Present Illness Pt is a 71yo female presenting s/p R-TKA on 12/23/21. PMH: chronic pain, anxiety & depression, DM, GERD, R-THA 2014.    PT Comments    POD # 3 pm session with "Jeff" Pt taking 5mg  of Oxy vs 10mg  showing improved coordination/alertness but still struggling with transfers and fatigue.  General transfer comment: this is were pt struggles to self perform.  Unable to push up from bed she prefers to use forward weight shift momentum and pull self up with hands already on walker.  Required MAX Assist with HIGH FALL risk to slide off side bed.  Pt reports weakness in her "other knee".  Had [place] "hands on" assist off elevated bed. Even more fatigued this afternoon. General Gait Details: limited distance around the room as well as to and from [place] which was placed just inside door.  Had Merry Proud "hands on" assist with instruction on safe handling.  Pt's balance is poor and posture forward flexed over walker.  MAX c/o fatigue.  HIGH FALL RISK. General stair comments: one step with 50% VC's on proper walker placement, proper sequencing as well as increased time to complete. With Merry Proud "hands on" assisting pt with near fall on desend as "other" knee buckled.  Pt is scheduled to receive OP PT.  Pt is NOT safe or steady enough to attend OP.  Will consult LPT and rec HH PT instead.Pt also did NOT meet safe mobility goals to D/C to home today.   Recommendations for follow up therapy are one component of a multi-disciplinary discharge planning process, led by the attending physician.  Recommendations may be updated based on patient status, additional functional criteria and insurance authorization.  Follow Up Recommendations  Follow physician's recommendations for discharge plan and follow up therapies     Assistance Recommended at Discharge Intermittent  Supervision/Assistance  Patient can return home with the following A little help with walking and/or transfers;A little help with bathing/dressing/bathroom;Assistance with cooking/housework;Help with stairs or ramp for entrance;Assist for transportation   Equipment Recommendations  None recommended by PT    Recommendations for Other Services       Precautions / Restrictions Precautions Precautions: Fall Precaution Comments: instructed no pillow under knee Restrictions Weight Bearing Restrictions: No RLE Weight Bearing: Weight bearing as tolerated Other Position/Activity Restrictions: AxO x 3 very pleasant lady but c/o feeling "tired.  Pt reports "i have been dealingwith fatigue for quite some time".  Reports COVID last July.     Mobility  Bed Mobility Overal bed mobility: Needs Assistance Bed Mobility: Supine to Sit     Supine to sit: Min guard     General bed mobility comments: pt self able to transition to EOB with increased time and use of belt to support LE    Transfers Overall transfer level: Needs assistance Equipment used: Rolling walker (2 wheels)   Sit to Stand: From elevated surface, Max assist, Total assist, +2 physical assistance, +2 safety/equipment           General transfer comment: this is were pt struggles to self perform.  Unable to push up from bed she prefers to use forward weight shift momentum and pull self up with hands already on walker.  Required MAX Assist with HIGH FALL risk to slide off side bed.  Pt reports weakness in her "other knee".  Had [place] "hands on" assist off elevated bed.  Even more fatigued this afternoon.    Ambulation/Gait Ambulation/Gait assistance: Min assist Gait Distance (Feet): 12 Feet Assistive device: Rolling walker (2 wheels) Gait Pattern/deviations: Step-to pattern Gait velocity: decreased     General Gait Details: limited distance around the room as well as to and from ONE STEP which was placed just inside door.   Had Trey Paula "hands on" assist with instruction on safe handling.  Pt's balance is poor and posture forward flexed over walker.  MAX c/o fatigue.  HIGH FALL RISK.   Stairs Stairs: Yes Stairs assistance: Max assist Stair Management: No rails, Step to pattern, Forwards, With walker Number of Stairs: 1 General stair comments: one step with 50% VC's on proper walker placement, proper sequencing as well as increased time to complete. With Trey Paula "hands on" assisting pt with near fall on desend as "other" knee buckled.  Pt is scheduled to receive OP PT.  Pt is NOT safe or steady enough to attend OP.  Will consult LPT and rec HH PT instead.   Wheelchair Mobility    Modified Rankin (Stroke Patients Only)       Balance                                            Cognition Arousal/Alertness: Awake/alert Behavior During Therapy: WFL for tasks assessed/performed Overall Cognitive Status: Within Functional Limits for tasks assessed                                 General Comments: AxO x 3 having difficulty getting OOB and c/o MAX fatigue.        Exercises      General Comments        Pertinent Vitals/Pain Pain Assessment Pain Assessment: Faces Faces Pain Scale: Hurts even more Pain Location: R knee Pain Descriptors / Indicators: Aching, Operative site guarding, Discomfort Pain Intervention(s): Monitored during session, Repositioned, Premedicated before session    Home Living                          Prior Function            PT Goals (current goals can now be found in the care plan section) Progress towards PT goals: Progressing toward goals    Frequency    7X/week      PT Plan Current plan remains appropriate    Co-evaluation              AM-PAC PT "6 Clicks" Mobility   Outcome Measure  Help needed turning from your back to your side while in a flat bed without using bedrails?: A Lot Help needed moving from lying on  your back to sitting on the side of a flat bed without using bedrails?: A Lot Help needed moving to and from a bed to a chair (including a wheelchair)?: A Lot Help needed standing up from a chair using your arms (e.g., wheelchair or bedside chair)?: A Lot Help needed to walk in hospital room?: A Lot Help needed climbing 3-5 steps with a railing? : Total 6 Click Score: 11    End of Session Equipment Utilized During Treatment: Gait belt Activity Tolerance: Patient limited by pain;Patient limited by fatigue Patient left: in chair;with call bell/phone within reach;with chair alarm set Nurse Communication: Mobility  status PT Visit Diagnosis: Pain;Difficulty in walking, not elsewhere classified (R26.2) Pain - Right/Left: Right Pain - part of body: Knee     Time: 5038-8828 PT Time Calculation (min) (ACUTE ONLY): 29 min  Charges:  $Gait Training: 8-22 mins $Therapeutic Activity: 8-22 mins                     Felecia Shelling  PTA Acute  Rehabilitation Services Office M-F          [phone] Weekend pager [phone]

## 2021-12-27 DIAGNOSIS — M1711 Unilateral primary osteoarthritis, right knee: Secondary | ICD-10-CM | POA: Diagnosis not present

## 2021-12-27 LAB — GLUCOSE, CAPILLARY
Glucose-Capillary: 205 mg/dL — ABNORMAL HIGH (ref 70–99)
Glucose-Capillary: 246 mg/dL — ABNORMAL HIGH (ref 70–99)

## 2021-12-27 NOTE — Progress Notes (Addendum)
Physical Therapy Treatment Patient Details Name: Olivia Walker MRN: [id number] DOB: 04-19-1951 Today's Date: 12/27/2021   History of Present Illness Pt is a 71yo female presenting s/p R-TKA on 12/23/21. PMH: chronic pain, anxiety & depression, DM, GERD, R-THA 2014.    PT Comments    POD # 4 am session General Comments: AxO x 3 feeling "better" and planning to D/C to a friends home later today.  Pt sitting Indep on EOB on arrival.  Assisted with amb, practiced one step then assisted to bathroom.  Pt moving slowly but was able to self rise from elevated bed/toilet.  Assisted with static standing at sink and seated at sink for a wash up. Reviewed HEP.   Addressed all mobility questions, discussed appropriate activity, educated on use of ICE.  Pt ready for D/C to friend's home after one PT session as pt's fatigues in afternoon.  Best she D/C early in day.    Recommendations for follow up therapy are one component of a multi-disciplinary discharge planning process, led by the attending physician.  Recommendations may be updated based on patient status, additional functional criteria and insurance authorization.  Follow Up Recommendations  Follow physician's recommendations for discharge plan and follow up therapies     Assistance Recommended at Discharge Intermittent Supervision/Assistance  Patient can return home with the following A little help with walking and/or transfers;A little help with bathing/dressing/bathroom;Assistance with cooking/housework;Help with stairs or ramp for entrance;Assist for transportation   Equipment Recommendations  None recommended by PT    Recommendations for Other Services       Precautions / Restrictions Precautions Precautions: Fall Precaution Comments: instructed no pillow under knee Restrictions Weight Bearing Restrictions: No Other Position/Activity Restrictions: AxO x 3 very pleasant lady but c/o feeling "tired.  Pt reports "i have been dealingwith  fatigue for quite some time".  Reports COVID last July.     Mobility  Bed Mobility               General bed mobility comments: Pt sitting EOB Indep on arrival just finished breakfast.    Transfers Overall transfer level: Needs assistance Equipment used: Rolling walker (2 wheels) Transfers: Sit to/from Stand Sit to Stand: Supervision, Min guard           General transfer comment: with increased time and effort pt was able to self rise from elevated bed and elevated toilet with good use B UE's and forward momentum.    Ambulation/Gait Ambulation/Gait assistance: Supervision, Min guard Gait Distance (Feet): 25 Feet Assistive device: Rolling walker (2 wheels) Gait Pattern/deviations: Step-to pattern Gait velocity: decreased     General Gait Details: pt tolerated amb a functional distance in room as well as to and from bathroom.  Pt struggled with fatigue before surgery and appears at prior/or close to mobility level.  Balance is fair to poor pending on level of fatigue.  Still HIGH FALL RISK as her "other" knee gives way.   Stairs Stairs: Yes Stairs assistance: Supervision, Min guard Stair Management: No rails, Step to pattern, Forwards, With walker Number of Stairs: 1 General stair comments: pt able to navigate one step with walker at NO VC's and proper sequencing.  Advised to have Trey Paula "hand on" assist for safety.   Wheelchair Mobility    Modified Rankin (Stroke Patients Only)       Balance  Cognition Arousal/Alertness: Awake/alert Behavior During Therapy: WFL for tasks assessed/performed Overall Cognitive Status: Within Functional Limits for tasks assessed                                 General Comments: AxO x 3 feeling "better" and planning to D/C to a friends home later today        Exercises      General Comments        Pertinent Vitals/Pain Pain Assessment Pain  Assessment: 0-10 Pain Score: 5  Pain Location: R knee Pain Descriptors / Indicators: Aching, Operative site guarding, Discomfort Pain Intervention(s): Monitored during session, Premedicated before session, Repositioned, Ice applied    Home Living                          Prior Function            PT Goals (current goals can now be found in the care plan section) Progress towards PT goals: Progressing toward goals    Frequency    7X/week      PT Plan Current plan remains appropriate    Co-evaluation              AM-PAC PT "6 Clicks" Mobility   Outcome Measure  Help needed turning from your back to your side while in a flat bed without using bedrails?: A Little Help needed moving from lying on your back to sitting on the side of a flat bed without using bedrails?: A Little Help needed moving to and from a bed to a chair (including a wheelchair)?: A Little Help needed standing up from a chair using your arms (e.g., wheelchair or bedside chair)?: A Little Help needed to walk in hospital room?: A Lot Help needed climbing 3-5 steps with a railing? : A Lot 6 Click Score: 16    End of Session Equipment Utilized During Treatment: Gait belt Activity Tolerance: Patient tolerated treatment well Patient left: in chair;with call bell/phone within reach;with chair alarm set Nurse Communication: Mobility status (pt ready for D/C to home after one session.) PT Visit Diagnosis: Pain;Difficulty in walking, not elsewhere classified (R26.2) Pain - Right/Left: Right Pain - part of body: Knee     Time: 4332-9518 PT Time Calculation (min) (ACUTE ONLY): 46 min  Charges:  $Gait Training: 8-22 mins $Therapeutic Activity: 8-22 mins $Self Care/Home Management: 8-22                     Felecia Shelling  PTA Acute  Rehabilitation Services Office M-F          [phone] Weekend pager [phone]

## 2021-12-27 NOTE — Progress Notes (Signed)
   Subjective: 4 Days Post-Op Procedure(s) (LRB): TOTAL KNEE ARTHROPLASTY (Right) Patient seen in rounds by Dr. Lequita Halt. Patient is well, and has had no acute complaints or problems. Denies SOB or chest pain. Denies calf pain. Voiding without difficulty. Patient reports pain as mild.  Worked with physical therapy yesterday. She feels as if she is improving.    Objective: Vital signs in last 24 hours: Temp:  [98.7 F (37.1 C)-99.1 F (37.3 C)] 98.7 F (37.1 C) (08/04 0556) Pulse Rate:  [81-106] 81 (08/04 0556) Resp:  [17] 17 (08/04 0556) BP: (126-163)/(66-74) 163/72 (08/04 0556) SpO2:  [91 %-93 %] 93 % (08/04 0556)  Intake/Output from previous day:  Intake/Output Summary (Last 24 hours) at 12/27/2021 0736 Last data filed at 12/27/2021 0630 Gross per 24 hour  Intake 930 ml  Output 850 ml  Net 80 ml    Intake/Output this shift: No intake/output data recorded.  Labs: Recent Labs    12/25/21 0217  HGB 10.4*   Recent Labs    12/25/21 0217  WBC 12.4*  RBC 3.78*  HCT 32.9*  PLT 228   No results for input(s): "NA", "K", "CL", "CO2", "BUN", "CREATININE", "GLUCOSE", "CALCIUM" in the last 72 hours. No results for input(s): "LABPT", "INR" in the last 72 hours.  Exam: General - Patient is Alert and Oriented Extremity - Neurologically intact Neurovascular intact Sensation intact distally Dorsiflexion/Plantar flexion intact Dressing/Incision - clean, dry, no drainage Motor Function - intact, moving foot and toes well on exam.  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    Diabetes mellitus without complication (HCC)    prediabetic   Dyspnea    GERD (gastroesophageal reflux disease)    Hemorrhoids    mild   High cholesterol    History of kidney stones    Impaired glucose tolerance    OA (osteoarthritis)    hips,  knees   Obesity    PONV (postoperative nausea and vomiting)    Stones in the urinary tract     Assessment/Plan: 4 Days Post-Op Procedure(s) (LRB): TOTAL KNEE ARTHROPLASTY (Right) Principal Problem:   OA (osteoarthritis) of knee Active Problems:   Osteoarthritis of right knee  Estimated body mass index is 36.93 kg/m as calculated from the following:   Height as of this encounter: 5\' 4"  (1.626 m).   Weight as of this encounter: 97.6 kg.   DVT Prophylaxis - Aspirin Weight-bearing as tolerated.  Continue with physical therapy today. Expected discharge today pending progress with physical therapy. We had a lengthy discussion about HHPT vs OPPT and patient would benefit more from OPPT. She will miss her first visit today but we will work on rescheduling for early next week at . Follow-up in clinic in 2 weeks.  [facility], PA-C Orthopedic Surgery [phone] 12/27/2021, 7:36 AM

## 2021-12-27 NOTE — Progress Notes (Signed)
Pt alert and oriented. Surgical dressing clean, dry and intact. No questions or queries regarding discharge instructions. Pt belongings sent home with pt. 

## 2021-12-27 NOTE — Plan of Care (Signed)
  Problem: Education: Goal: Knowledge of General Education information will improve Description: Including pain rating scale, medication(s)/side effects and non-pharmacologic comfort measures Outcome: Progressing   Problem: Pain Managment: Goal: General experience of comfort will improve Outcome: Progressing   Problem: Safety: Goal: Ability to remain free from injury will improve Outcome: Progressing   Problem: Education: Goal: Knowledge of the prescribed therapeutic regimen will improve Outcome: Progressing   Problem: Clinical Measurements: Goal: Postoperative complications will be avoided or minimized Outcome: Progressing   Problem: Pain Management: Goal: Pain level will decrease with appropriate interventions Outcome: Progressing

## 2021-12-27 NOTE — Discharge Summary (Signed)
Physician Discharge Summary   Patient ID: Olivia Walker MRN: [id number] DOB/AGE: [id number] 69 y.o.  Admit date: 12/23/2021 Discharge date: 12/27/2021  Primary Diagnosis: Osteoarthritis of the right knee   Admission Diagnoses:  Past Medical History:  Diagnosis Date   Ankle swelling    Anxiety    Arthritis    Chronic pain    knees, hips   Depression with anxiety 10/08/2012    Treated since 1995 with Prozac, disability.    Diabetes mellitus without complication (HCC)    prediabetic   Dyspnea    GERD (gastroesophageal reflux disease)    Hemorrhoids    mild   High cholesterol    History of kidney stones    Impaired glucose tolerance    OA (osteoarthritis)    hips, knees   Obesity    PONV (postoperative nausea and vomiting)    Stones in the urinary tract    Discharge Diagnoses:   Principal Problem:   OA (osteoarthritis) of knee Active Problems:   Osteoarthritis of right knee  Estimated body mass index is 36.93 kg/m as calculated from the following:   Height as of this encounter: 5\' 4"  (1.626 m).   Weight as of this encounter: 97.6 kg.  Procedure:  Procedure(s) (LRB): TOTAL KNEE ARTHROPLASTY (Right)   Consults: None  HPI: Olivia Walker is a 71 y.o. year old female with end stage OA of her right knee with progressively worsening pain and dysfunction. She has constant pain, with activity and at rest and significant functional deficits with difficulties even with ADLs. She has had extensive non-op management including analgesics, injections of cortisone and viscosupplements, and home exercise program, but remains in significant pain with significant dysfunction.Radiographs show bone on bone arthritis lateral and patellofemoral. She presents now for right Total Knee Arthroplasty.    Laboratory Data: Admission on 12/23/2021, Discharged on 12/27/2021  Component Date Value Ref Range Status   Glucose-Capillary 12/23/2021 119 (H)  70 - 99 mg/dL Final   Glucose reference range  applies only to samples taken after fasting for at least 8 hours.   Comment 1 12/23/2021 Notify RN   Final   Comment 2 12/23/2021 Document in Chart   Final   Glucose-Capillary 12/23/2021 97  70 - 99 mg/dL Final   Glucose reference range applies only to samples taken after fasting for at least 8 hours.   Hgb A1c MFr Bld 12/23/2021 7.4 (H)  4.8 - 5.6 % Final   Comment: (NOTE) Pre diabetes:          5.7%-6.4%  Diabetes:              >6.4%  Glycemic control for   <7.0% adults with diabetes    Mean Plasma Glucose 12/23/2021 165.68  mg/dL Final   Performed at [facility], [place] [address] [place], [place] [address]   WBC 12/24/2021 12.3 (H)  4.0 - 10.5 K/uL Final   RBC 12/24/2021 3.54 (L)  3.87 - 5.11 MIL/uL Final   Hemoglobin 12/24/2021 9.5 (L)  12.0 - 15.0 g/dL Final   HCT 12/24/2021 30.5 (L)  36.0 - 46.0 % Final   MCV 12/24/2021 86.2  80.0 - 100.0 fL Final   MCH 12/24/2021 26.8  26.0 - 34.0 pg Final   MCHC 12/24/2021 31.1  30.0 - 36.0 g/dL Final   RDW 12/24/2021 15.3  11.5 - 15.5 % Final   Platelets 12/24/2021 221  150 - 400 K/uL Final   nRBC 12/24/2021 0.0  0.0 -  0.2 % Final   Performed at [facility], [address] [place], [place] [address]   Sodium 12/24/2021 137  135 - 145 mmol/L Final   Potassium 12/24/2021 3.9  3.5 - 5.1 mmol/L Final   Chloride 12/24/2021 103  98 - 111 mmol/L Final   CO2 12/24/2021 25  22 - 32 mmol/L Final   Glucose, Bld 12/24/2021 178 (H)  70 - 99 mg/dL Final   Glucose reference range applies only to samples taken after fasting for at least 8 hours.   BUN 12/24/2021 13  8 - 23 mg/dL Final   Creatinine, Ser 12/24/2021 1.09 (H)  0.44 - 1.00 mg/dL Final   Calcium 95/63/8756 8.3 (L)  8.9 - 10.3 mg/dL Final   GFR, Estimated 12/24/2021 55 (L)  >60 mL/min Final   Comment: (NOTE) Calculated using the CKD-EPI Creatinine Equation (2021)    Anion gap 12/24/2021 9  5 - 15 Final   Performed at [facility], [address] [place], [place] [address]   Glucose-Capillary 12/23/2021 237 (H)  70 - 99 mg/dL Final   Glucose reference range applies only to samples taken after fasting for at least 8 hours.   Glucose-Capillary 12/23/2021 221 (H)  70 - 99 mg/dL Final   Glucose reference range applies only to samples taken after fasting for at least 8 hours.   Glucose-Capillary 12/24/2021 133 (H)  70 - 99 mg/dL Final   Glucose reference range applies only to samples taken after fasting for at least 8 hours.   Glucose-Capillary 12/24/2021 147 (H)  70 - 99 mg/dL Final   Glucose reference range applies only to samples taken after fasting for at least 8 hours.   WBC 12/25/2021 12.4 (H)  4.0 - 10.5 K/uL Final   RBC 12/25/2021 3.78 (L)  3.87 - 5.11 MIL/uL Final   Hemoglobin 12/25/2021 10.4 (L)  12.0 - 15.0 g/dL Final   HCT 51/88/4166 32.9 (L)  36.0 - 46.0 % Final   MCV 12/25/2021 87.0  80.0 - 100.0 fL Final   MCH 12/25/2021 27.5  26.0 - 34.0 pg Final   MCHC 12/25/2021 31.6  30.0 - 36.0 g/dL Final   RDW 11/23/1599 15.6 (H)  11.5 - 15.5 % Final   Platelets 12/25/2021 228  150 - 400 K/uL Final   nRBC 12/25/2021 0.0  0.0 - 0.2 % Final   Performed at [facility], [address], [place], [place] [address]   Glucose-Capillary 12/24/2021 223 (H)  70 - 99 mg/dL Final   Glucose reference range applies only to samples taken after fasting for at least 8 hours.   Glucose-Capillary 12/24/2021 175 (H)  70 - 99 mg/dL Final   Glucose reference range applies only to samples taken after fasting for at least 8 hours.   Glucose-Capillary 12/25/2021 184 (H)  70 - 99 mg/dL Final   Glucose reference range applies only to samples taken after fasting for at least 8 hours.   Glucose-Capillary 12/25/2021 243 (H)  70 - 99 mg/dL Final   Glucose reference range applies only to samples taken after fasting for at least 8 hours.   Glucose-Capillary 12/25/2021 169 (H)  70 - 99 mg/dL Final   Glucose reference range applies only  to samples taken after fasting for at least 8 hours.   Glucose-Capillary 12/25/2021 185 (H)  70 - 99 mg/dL Final   Glucose reference range applies only to samples taken after fasting for at least 8 hours.  Glucose-Capillary 12/26/2021 162 (H)  70 - 99 mg/dL Final   Glucose reference range applies only to samples taken after fasting for at least 8 hours.   Glucose-Capillary 12/26/2021 174 (H)  70 - 99 mg/dL Final   Glucose reference range applies only to samples taken after fasting for at least 8 hours.   Glucose-Capillary 12/26/2021 186 (H)  70 - 99 mg/dL Final   Glucose reference range applies only to samples taken after fasting for at least 8 hours.   Glucose-Capillary 12/26/2021 217 (H)  70 - 99 mg/dL Final   Glucose reference range applies only to samples taken after fasting for at least 8 hours.   Glucose-Capillary 12/27/2021 205 (H)  70 - 99 mg/dL Final   Glucose reference range applies only to samples taken after fasting for at least 8 hours.   Glucose-Capillary 12/27/2021 246 (H)  70 - 99 mg/dL Final   Glucose reference range applies only to samples taken after fasting for at least 8 hours.  Hospital Outpatient Visit on 12/11/2021  Component Date Value Ref Range Status   MRSA, PCR 12/11/2021 NEGATIVE  NEGATIVE Final   Staphylococcus aureus 12/11/2021 NEGATIVE  NEGATIVE Final   Comment: (NOTE) The Xpert SA Assay (FDA approved for NASAL specimens in patients 98 years of age and older), is one component of a comprehensive surveillance program. It is not intended to diagnose infection nor to guide or monitor treatment. Performed at [facility], [place] [address] [place], [place] [address]    WBC 12/11/2021 14.3 (H)  4.0 - 10.5 K/uL Final   RBC 12/11/2021 4.50  3.87 - 5.11 MIL/uL Final   Hemoglobin 12/11/2021 11.8 (L)  12.0 - 15.0 g/dL Final   HCT 12/11/2021 38.2  36.0 - 46.0 % Final   MCV 12/11/2021 84.9  80.0 - 100.0 fL Final   MCH 12/11/2021 26.2  26.0 - 34.0  pg Final   MCHC 12/11/2021 30.9  30.0 - 36.0 g/dL Final   RDW 12/11/2021 16.0 (H)  11.5 - 15.5 % Final   Platelets 12/11/2021 294  150 - 400 K/uL Final   nRBC 12/11/2021 0.0  0.0 - 0.2 % Final   Performed at [facility], [place] [address] [place], [place] [address]   Sodium 12/11/2021 136  135 - 145 mmol/L Final   Potassium 12/11/2021 3.7  3.5 - 5.1 mmol/L Final   Chloride 12/11/2021 102  98 - 111 mmol/L Final   CO2 12/11/2021 24  22 - 32 mmol/L Final   Glucose, Bld 12/11/2021 218 (H)  70 - 99 mg/dL Final   Glucose reference range applies only to samples taken after fasting for at least 8 hours.   BUN 12/11/2021 16  8 - 23 mg/dL Final   Creatinine, Ser 12/11/2021 0.96  0.44 - 1.00 mg/dL Final   Calcium 12/11/2021 9.4  8.9 - 10.3 mg/dL Final   GFR, Estimated 12/11/2021 >60  >60 mL/min Final   Comment: (NOTE) Calculated using the CKD-EPI Creatinine Equation (2021)    Anion gap 12/11/2021 10  5 - 15 Final   Performed at [facility], [place] [address] [place], [place] [address]   Glucose-Capillary 12/11/2021 226 (H)  70 - 99 mg/dL Final   Glucose reference range applies only to samples taken after fasting for at least 8 hours.     X-Rays:VAS US CAROTID  Result Date: 12/04/2021 Carotid Arterial Duplex Study Patient Name:  Damaris Hippo  Date of Exam:  12/03/2021 Medical Rec #: [id number]      Accession #:    [id number] Date of Birth: 06-10-1950       Patient Gender: F Patient Age:   54 years Exam Location:  [facility] Procedure:      VAS US CAROTID Referring Phys: Minus Breeding --------------------------------------------------------------------------------  Indications:       Right bruit and patient denies any cerebrovascular symptoms. Risk Factors:      Hyperlipidemia, no history of smoking. Limitations        Today's exam was limited due to the high bifurcation of the                    carotid and the body habitus of the patient. Comparison Study:  NA  Performing Technologist: Sharlett Iles RVT  Examination Guidelines: A complete evaluation includes B-mode imaging, spectral Doppler, color Doppler, and power Doppler as needed of all accessible portions of each vessel. Bilateral testing is considered an integral part of a complete examination. Limited examinations for reoccurring indications may be performed as noted.  Right Carotid Findings: +----------+--------+--------+--------+------------------+--------+           PSV cm/sEDV cm/sStenosisPlaque DescriptionComments +----------+--------+--------+--------+------------------+--------+ CCA Prox  98      12                                         +----------+--------+--------+--------+------------------+--------+ CCA Distal64      14                                         +----------+--------+--------+--------+------------------+--------+ ICA Prox  47      14      Normal                             +----------+--------+--------+--------+------------------+--------+ ICA Mid   49      15                                         +----------+--------+--------+--------+------------------+--------+ ICA Distal51      21                                         +----------+--------+--------+--------+------------------+--------+ ECA       73      13                                         +----------+--------+--------+--------+------------------+--------+ +----------+--------+-------+----------------------+-------------------+           PSV cm/sEDV cmsDescribe              Arm Pressure (mmHG) +----------+--------+-------+----------------------+-------------------+ [id number]            Stenotic and Turbulent143                 +----------+--------+-------+----------------------+-------------------+ +---------+--------+--+--------+--+---------+ VertebralPSV cm/s55EDV cm/s20Antegrade +---------+--------+--+--------+--+---------+  Left Carotid Findings:  +----------+--------+--------+--------+--------------------------+----------+           PSV cm/sEDV cm/sStenosisPlaque Description        Comments   +----------+--------+--------+--------+--------------------------+----------+  CCA Prox  102     22      <50%    heterogenous                         +----------+--------+--------+--------+--------------------------+----------+ CCA Mid   88      19              heterogenous and irregular           +----------+--------+--------+--------+--------------------------+----------+ CCA Distal61      19              heterogenous                         +----------+--------+--------+--------+--------------------------+----------+ ICA Prox  81      19      1-39%   heterogenous                         +----------+--------+--------+--------+--------------------------+----------+ ICA Mid   95      24                                                   +----------+--------+--------+--------+--------------------------+----------+ ICA Distal79      34                                        steep dive +----------+--------+--------+--------+--------------------------+----------+ ECA       119     19              heterogenous                         +----------+--------+--------+--------+--------------------------+----------+ +----------+--------+--------+----------------+-------------------+           PSV cm/sEDV cm/sDescribe        Arm Pressure (mmHG) +----------+--------+--------+----------------+-------------------+ Subclavian160             Multiphasic, [id number]                 +----------+--------+--------+----------------+-------------------+ +---------+--------+--+--------+--+---------+ VertebralPSV cm/s59EDV cm/s14Antegrade +---------+--------+--+--------+--+---------+   Summary: Right Carotid: There is no evidence of stenosis in the right ICA. There was no                evidence of thrombus, dissection,  atherosclerotic plaque or                stenosis in the cervical carotid system. Left Carotid: Velocities in the left ICA are consistent with a 1-39% stenosis. Vertebrals:  Bilateral vertebral arteries demonstrate antegrade flow. Subclavians: Right subclavian artery was stenotic. Right subclavian artery flow              was disturbed. Normal flow hemodynamics were seen in the left              subclavian artery. *See table(s) above for measurements and observations.  Electronically signed by Kathlyn Sacramento MD on 12/04/2021 at 4:39:08 PM.    Final     EKG: Orders placed or performed during the hospital encounter of 12/11/21   EKG 12-Lead   EKG 12-Lead   EKG 12-Lead   EKG 12-Lead     Hospital Course: Olivia Walker is a 71 y.o. who was admitted  to [facility]. They were brought to the operating room on 12/23/2021 and underwent Procedure(s): TOTAL KNEE ARTHROPLASTY.  Patient tolerated the procedure well and was later transferred to the recovery room and then to the orthopaedic floor for postoperative care. They were given PO and IV analgesics for pain control following their surgery. They were given 24 hours of postoperative antibiotics of  Anti-infectives (From admission, onward)    Start     Dose/Rate Route Frequency Ordered Stop   12/23/21 1400  ceFAZolin (ANCEF) IVPB 2g/100 mL premix        2 g 200 mL/hr over 30 Minutes Intravenous Every 6 hours 12/23/21 1210 12/24/21 1324   12/23/21 0615  ceFAZolin (ANCEF) IVPB 2g/100 mL premix        2 g 200 mL/hr over 30 Minutes Intravenous On call to O.R. 12/23/21 [id number] 12/23/21 [id number]     and started on DVT prophylaxis in the form of Aspirin.   PT and OT were ordered for total joint protocol. Discharge planning consulted to help with postop disposition and equipment needs. Patient had a fair night on the evening of surgery. They started to get up OOB with physical therapy on POD #0. Patient progressed slowly and worked with physical therapy into POD  #4. Patient had a total of 7 additional sessions and was ready to discharge home after meeting their goals. Dressing was changed and the incision was C/D/I.  They were discharged home later that day in stable condition.  Diet: Regular diet Activity: WBAT Follow-up: in 2 weeks Disposition: Home Discharged Condition: stable   Discharge Instructions     Call MD / Call 911   Complete by: As directed    If you experience chest pain or shortness of breath, CALL 911 and be transported to the hospital emergency room.  If you develope a fever above 101 F, pus (white drainage) or increased drainage or redness at the wound, or calf pain, call your surgeon's office.   Change dressing   Complete by: As directed    You may remove the bulky bandage (ACE wrap and gauze) two days after surgery. You will have an adhesive waterproof bandage underneath. Leave this in place until your first follow-up appointment.   Constipation Prevention   Complete by: As directed    Drink plenty of fluids.  Prune juice may be helpful.  You may use a stool softener, such as Colace (over the counter) 100 mg twice a day.  Use MiraLax (over the counter) for constipation as needed.   Diet - low sodium heart healthy   Complete by: As directed    Do not put a pillow under the knee. Place it under the heel.   Complete by: As directed    Driving restrictions   Complete by: As directed    No driving for two weeks   Post-operative opioid taper instructions:   Complete by: As directed    POST-OPERATIVE OPIOID TAPER INSTRUCTIONS: It is important to wean off of your opioid medication as soon as possible. If you do not need pain medication after your surgery it is ok to stop day one. Opioids include: Codeine, Hydrocodone(Norco, Vicodin), Oxycodone(Percocet, oxycontin) and hydromorphone amongst others.  Long term and even short term use of opiods can cause: Increased pain response Dependence Constipation Depression Respiratory  depression And more.  Withdrawal symptoms can include Flu like symptoms Nausea, vomiting And more Techniques to manage these symptoms Hydrate well Eat regular healthy meals Stay active Use relaxation  techniques(deep breathing, meditating, yoga) Do Not substitute Alcohol to help with tapering If you have been on opioids for less than two weeks and do not have pain than it is ok to stop all together.  Plan to wean off of opioids This plan should start within one week post op of your joint replacement. Maintain the same interval or time between taking each dose and first decrease the dose.  Cut the total daily intake of opioids by one tablet each day Next start to increase the time between doses. The last dose that should be eliminated is the evening dose.      TED hose   Complete by: As directed    Use stockings (TED hose) for three weeks on both leg(s).  You may remove them at night for sleeping.   Weight bearing as tolerated   Complete by: As directed       Allergies as of 12/27/2021       Reactions   Penicillins    From when younger Tolerated Cephalosporin Date: 12/24/21.        Medication List     TAKE these medications    ALPRAZolam 0.5 MG tablet Commonly known as: XANAX Take 0.25 mg by mouth at bedtime.   aspirin EC 325 MG tablet Take 1 tablet (325 mg total) by mouth 2 (two) times daily for 20 days. Then take one 81 mg aspirin once a day for three weeks. Then discontinue aspirin.   atorvastatin 10 MG tablet Commonly known as: LIPITOR Take 10 mg by mouth at bedtime.   FLUoxetine 40 MG capsule Commonly known as: PROZAC Take 40 mg by mouth at bedtime.   gabapentin 300 MG capsule Commonly known as: NEURONTIN Take a 300 mg capsule three times a day for two weeks following surgery.Then take a 300 mg capsule two times a day for two weeks. Then take a 300 mg capsule once a day for two weeks. Then discontinue.   Januvia 100 MG tablet Generic drug:  sitaGLIPtin Take 100 mg by mouth daily.   metFORMIN 750 MG 24 hr tablet Commonly known as: GLUCOPHAGE-XR Take 750 mg by mouth 2 (two) times daily.   methocarbamol 500 MG tablet Commonly known as: ROBAXIN Take 1 tablet (500 mg total) by mouth every 6 (six) hours as needed for muscle spasms.   omeprazole 40 MG capsule Commonly known as: PRILOSEC Take 40 mg by mouth daily.   ondansetron 4 MG disintegrating tablet Commonly known as: ZOFRAN-ODT Take 4 mg by mouth every 8 (eight) hours as needed for nausea or vomiting.   OneTouch Verio test strip Generic drug: glucose blood Once daily testing E11.9   oxyCODONE 5 MG immediate release tablet Commonly known as: Oxy IR/ROXICODONE Take 1-2 tablets (5-10 mg total) by mouth every 6 (six) hours as needed for moderate pain or severe pain.   prednisoLONE acetate 1 % ophthalmic suspension Commonly known as: PRED FORTE Place 1 drop into the left eye 4 (four) times daily.   tamsulosin 0.4 MG Caps capsule Commonly known as: Flomax Take 1 capsule (0.4 mg total) by mouth daily. What changed:  when to take this reasons to take this   traZODone 50 MG tablet Commonly known as: DESYREL Take 150 mg by mouth at bedtime.               Discharge Care Instructions  (From admission, onward)           Start     Ordered   12/24/21  0000  Weight bearing as tolerated        12/24/21 0754   12/24/21 0000  Change dressing       Comments: You may remove the bulky bandage (ACE wrap and gauze) two days after surgery. You will have an adhesive waterproof bandage underneath. Leave this in place until your first follow-up appointment.   12/24/21 0754            Follow-up Information     Ollen Gross, MD. Schedule an appointment as soon as possible for a visit in 2 week(s).   Specialty: Orthopedic Surgery Contact information: [address] [address] [place] [address] [phone]                 Signed: R. Arcola Jansky, PA-C Orthopedic Surgery 12/27/2021, 10:27 PM

## 2022-04-09 DIAGNOSIS — Z9889 Other specified postprocedural states: Secondary | ICD-10-CM | POA: Insufficient documentation

## 2022-04-22 ENCOUNTER — Other Ambulatory Visit: Payer: Self-pay

## 2022-04-22 DIAGNOSIS — Z1231 Encounter for screening mammogram for malignant neoplasm of breast: Secondary | ICD-10-CM

## 2022-06-17 ENCOUNTER — Ambulatory Visit
Admission: RE | Admit: 2022-06-17 | Discharge: 2022-06-17 | Disposition: A | Discharge status: Home / Self Care | Payer: Medicare Other | Source: Ambulatory Visit | Attending: Family Medicine | Admitting: Family Medicine

## 2022-06-17 DIAGNOSIS — Z1231 Encounter for screening mammogram for malignant neoplasm of breast: Secondary | ICD-10-CM

## 2022-06-17 DIAGNOSIS — G4730 Sleep apnea, unspecified: Secondary | ICD-10-CM

## 2022-06-17 HISTORY — DX: Sleep apnea, unspecified: G47.30

## 2023-06-29 ENCOUNTER — Other Ambulatory Visit: Payer: Self-pay | Admitting: Family Medicine

## 2023-06-29 DIAGNOSIS — Z1231 Encounter for screening mammogram for malignant neoplasm of breast: Secondary | ICD-10-CM

## 2023-07-01 ENCOUNTER — Ambulatory Visit
Admission: RE | Admit: 2023-07-01 | Discharge: 2023-07-01 | Disposition: A | Discharge status: Home / Self Care | Payer: Medicare Other | Source: Ambulatory Visit | Attending: Family Medicine | Admitting: Family Medicine

## 2023-07-01 DIAGNOSIS — Z1231 Encounter for screening mammogram for malignant neoplasm of breast: Secondary | ICD-10-CM

## 2023-08-04 NOTE — H&P (Addendum)
 TOTAL KNEE ADMISSION H&P  Patient is being admitted for left total knee arthroplasty.  Subjective:  Chief Complaint: Left knee pain.  HPI: Olivia Walker, 73 y.o. female has a history of pain and functional disability in the left knee due to arthritis and has failed non-surgical conservative treatments for greater than 12 weeks to include NSAID's and/or analgesics, corticosteriod injections, viscosupplementation injections, and activity modification. Onset of symptoms was gradual, starting several years ago with gradually worsening course since that time. The patient noted no past surgery on the left knee.  Patient currently rates pain in the left knee at 8 out of 10 with activity. Patient has night pain, worsening of pain with activity and weight bearing, and pain that interferes with activities of daily living. Patient has evidence of   bone-on-bone arthritis in the lateral and patellofemoral compartments. There is valgus deformity  by imaging studies. There is no active infection.  Patient Active Problem List   Diagnosis Date Noted   OA (osteoarthritis) of knee 12/23/2021   Osteoarthritis of right knee 12/23/2021   Nonspecific abnormal electrocardiogram (ECG) (EKG) 11/28/2021   Preop cardiovascular exam 11/28/2021   Insomnia, persistent 10/08/2012   Depression with anxiety 10/08/2012   OA (osteoarthritis) of hip 07/07/2012    Past Medical History:  Diagnosis Date   Ankle swelling    Anxiety    Arthritis    Chronic pain    knees, hips   Depression with anxiety 10/08/2012    Treated since 1995 with Prozac, disability.    Diabetes mellitus without complication (HCC)    prediabetic   Dyspnea    GERD (gastroesophageal reflux disease)    Hemorrhoids    mild   High cholesterol    History of kidney stones    Impaired glucose tolerance    OA (osteoarthritis)    hips, knees   Obesity    PONV (postoperative nausea and vomiting)    Stones in the urinary tract     Past Surgical  History:  Procedure Laterality Date   CHOLECYSTECTOMY  12/30/2011   Procedure: LAPAROSCOPIC CHOLECYSTECTOMY;  Surgeon: Emelia Loron, MD;  Location: [facility] OR;  Service: General;  Laterality: N/A;   EYE SURGERY     HAND SURGERY     rt carpal tunnel 90   HYSTERECTOMY ABDOMINAL WITH SALPINGECTOMY     kidney stones     KNEE SURGERY Bilateral 01:left,1985:right   torn meniscus lft   KNEE SURGERY  05/27/1983   lateral release   left eye cataract surgery      NASAL SEPTUM SURGERY     TOTAL HIP ARTHROPLASTY Right 07/07/2012   Procedure: TOTAL HIP ARTHROPLASTY;  Surgeon: Loanne Drilling, MD;  Location: [facility] ORS;  Service: Orthopedics;  Laterality: Right;   TOTAL HIP ARTHROPLASTY  05/26/2012   TOTAL KNEE ARTHROPLASTY Right 12/23/2021   Procedure: TOTAL KNEE ARTHROPLASTY;  Surgeon: Ollen Gross, MD;  Location: [facility] ORS;  Service: Orthopedics;  Laterality: Right;    Prior to Admission medications   Medication Sig Start Date End Date Taking? Authorizing Provider  ALPRAZolam Prudy Feeler) 0.5 MG tablet Take 0.25 mg by mouth at bedtime.    [provider]  atorvastatin (LIPITOR) 10 MG tablet Take 10 mg by mouth at bedtime.    [provider]  FLUoxetine (PROZAC) 40 MG capsule Take 40 mg by mouth at bedtime.    [provider]  gabapentin (NEURONTIN) 300 MG capsule Take a 300 mg capsule three times a day for two weeks following  surgery.Then take a 300 mg capsule two times a day for two weeks. Then take a 300 mg capsule once a day for two weeks. Then discontinue. 12/24/21   Edmisten, Kristie L, PA  glucose blood (ONETOUCH VERIO) test strip Once daily testing E11.9    [provider]  JANUVIA 100 MG tablet Take 100 mg by mouth daily. 10/23/21   [provider]  metFORMIN (GLUCOPHAGE-XR) 750 MG 24 hr tablet Take 750 mg by mouth 2 (two) times daily. 09/11/21   [provider]  methocarbamol (ROBAXIN) 500 MG tablet Take 1 tablet (500 mg total) by mouth every 6  (six) hours as needed for muscle spasms. 12/24/21   Edmisten, Kristie L, PA  omeprazole (PRILOSEC) 40 MG capsule Take 40 mg by mouth daily. 09/18/21   [provider]  ondansetron (ZOFRAN-ODT) 4 MG disintegrating tablet Take 4 mg by mouth every 8 (eight) hours as needed for nausea or vomiting.    [provider]  oxyCODONE (OXY IR/ROXICODONE) 5 MG immediate release tablet Take 1-2 tablets (5-10 mg total) by mouth every 6 (six) hours as needed for moderate pain or severe pain. 12/24/21   Edmisten, Kristie L, PA  prednisoLONE acetate (PRED FORTE) 1 % ophthalmic suspension Place 1 drop into the left eye 4 (four) times daily.    [provider]  tamsulosin (FLOMAX) 0.4 MG CAPS capsule Take 1 capsule (0.4 mg total) by mouth daily. Patient taking differently: Take 0.4 mg by mouth daily as needed (Kidney stones). 08/12/18   Palumbo, April, MD  traZODone (DESYREL) 50 MG tablet Take 150 mg by mouth at bedtime. 10/04/21   [provider]    Allergies  Allergen Reactions   Penicillins     From when younger  Tolerated Cephalosporin Date: 12/24/21.      Social History   Socioeconomic History   Marital status: Single    Spouse name: Not on file   Number of children: Not on file   Years of education: 12   Highest education level: Not on file  Occupational History    Employer: DISABLED  Tobacco Use   Smoking status: Never   Smokeless tobacco: Never  Vaping Use   Vaping status: Never Used  Substance and Sexual Activity   Alcohol use: No   Drug use: No   Sexual activity: Not on file  Other Topics Concern   Not on file  Social History Narrative   Not on file   Social Drivers of Health   Financial Resource Strain: Not on file  Food Insecurity: Low Risk  (10/28/2022)   Received from [facility]   Hunger Vital Sign    Worried About Running Out of Food in the Last Year: Never true    Ran Out of Food in the Last Year: Never true  Transportation Needs: Not on  file (10/28/2022)  Physical Activity: Not on file  Stress: Not on file  Social Connections: Unknown (10/04/2021)   Received from [facility]   Social Network    Social Network: Not on file  Intimate Partner Violence: Unknown (08/26/2021)   Received from [facility]   HITS    Physically Hurt: Not on file    Insult or Talk Down To: Not on file    Threaten Physical Harm: Not on file    Scream or Curse: Not on file    Tobacco Use: Low Risk  (07/01/2023)   Patient History    Smoking Tobacco Use: Never  Smokeless Tobacco Use: Never    Passive Exposure: Not on file   Social History   Substance and Sexual Activity  Alcohol Use No    Family History  Adopted: Yes  Problem Relation Age of Onset   Diabetes Mother    Heart Problems Mother        CABG   Breast cancer Neg Hx     ROS  Objective:  Physical Exam: Well nourished and well developed.  General: Alert and oriented x3, cooperative and pleasant, no acute distress.  Head: normocephalic, atraumatic, neck supple.  Eyes: EOMI. Abdomen: non-tender to palpation and soft, normoactive bowel sounds. Musculoskeletal: Left Knee Exam:  Valgus deformity.  No effusion present. No swelling present.  The Range of motion is: 0 to 125 degrees.  Moderate crepitus on range of motion of the knee.  No medial joint line tenderness.  Slight lateral joint line tenderness.  The knee is stable.  Calves soft and nontender. Motor function intact in LE. Strength 5/5 LE bilaterally. Neuro: Distal pulses 2+. Sensation to light touch intact in LE.  Vital signs in last 24 hours: BP: ()/()  Arterial Line BP: ()/()   Imaging Review Plain radiographs demonstrate moderate degenerative joint disease of the left knee. The overall alignment is mild valgus. The bone quality appears to be adequate for age and reported activity level.  Assessment/Plan:  End stage arthritis, left knee   The patient history, physical  examination, clinical judgment of the provider and imaging studies are consistent with end stage degenerative joint disease of the left knee and total knee arthroplasty is deemed medically necessary. The treatment options including medical management, injection therapy arthroscopy and arthroplasty were discussed at length. The risks and benefits of total knee arthroplasty were presented and reviewed. The risks due to aseptic loosening, infection, stiffness, patella tracking problems, thromboembolic complications and other imponderables were discussed. The patient acknowledged the explanation, agreed to proceed with the plan and consent was signed. Patient is being admitted for inpatient treatment for surgery, pain control, PT, OT, prophylactic antibiotics, VTE prophylaxis, progressive ambulation and ADLs and discharge planning. The patient is planning to be discharged  home .  Patient's anticipated LOS is less than 2 midnights, meeting these requirements: - Lives within 1 hour of care - Has a competent adult at home to recover with post-op - NO history of  - Chronic pain requiring opiods  - Coronary Artery Disease  - Heart failure  - Heart attack  - Stroke  - DVT/VTE  - Cardiac arrhythmia  - Respiratory Failure/COPD  - Renal failure  - Anemia  - Advanced Liver disease  Therapy Plans: EO Disposition: Home with Friend Planned DVT Prophylaxis: Aspirin 81 mg BID DME Needed: None PCP: [place] Fulbright, PA-C (clearance received) TXA: IV Allergies: PCN (hives - childhood rxn), nitrofuratonin (N/V) Anesthesia Concerns: None BMI: 38.2  Last HgbA1c: 6.9% - 05/2023  Pharmacy: [facility])  Other: -Given clearance form to take to PCP -Ended up in hospital 4 days with last TKA due to slow mobility progression  - Patient was instructed on what medications to stop prior to surgery. - Follow-up visit in 2 weeks with Dr. Lequita Halt - Begin physical therapy following surgery -  Pre-operative lab work as pre-surgical testing - Prescriptions will be provided in hospital at time of discharge  R. Arcola Jansky, PA-C Orthopedic Surgery [facility] [place]

## 2023-08-05 ENCOUNTER — Ambulatory Visit: Admitting: Podiatry

## 2023-08-05 ENCOUNTER — Encounter: Payer: Self-pay | Admitting: Podiatry

## 2023-08-05 DIAGNOSIS — L603 Nail dystrophy: Secondary | ICD-10-CM

## 2023-08-05 NOTE — Progress Notes (Signed)
 Subjective:  Patient ID: Olivia Walker, female    DOB: 1950-09-05,  MRN: [id number]  Chief Complaint  Patient presents with   Nail Problem    Left hallux nail discolored     73 y.o. female presents with the above complaint.  Patient presents with complaint of toes nail dystrophy which is progressive gotten worse worse with ambulation as pressure she would like to have removed she would like to make a permanent pain scale 7-10 dull aching nature she denies any other acute complaints.   Review of Systems: Negative except as noted in the HPI. Denies N/V/F/Ch.  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    Diabetes mellitus without complication (HCC)    prediabetic   Dyspnea    GERD (gastroesophageal reflux disease)    Hemorrhoids    mild   High cholesterol    History of kidney stones    Impaired glucose tolerance    OA (osteoarthritis)    hips, knees   Obesity    PONV (postoperative nausea and vomiting)    Sleep disorder breathing 06/17/2022   Stones in the urinary tract     Current Outpatient Medications:    promethazine (PHENERGAN) 25 MG tablet, Take by mouth., Disp: , Rfl:    zaleplon (SONATA) 5 MG capsule, Take one capsule upon arrival to sleep center. May repeat X1 if needed., Disp: , Rfl:    ALPRAZolam (XANAX) 0.5 MG tablet, Take 0.25 mg by mouth at bedtime., Disp: , Rfl:    atorvastatin (LIPITOR) 10 MG tablet, Take 10 mg by mouth at bedtime., Disp: , Rfl:    FLUoxetine (PROZAC) 40 MG capsule, Take 40 mg by mouth at bedtime., Disp: , Rfl:    gabapentin (NEURONTIN) 300 MG capsule, Take a 300 mg capsule three times a day for two weeks following surgery.Then take a 300 mg capsule two times a day for two weeks. Then take a 300 mg capsule once a day for two weeks. Then discontinue., Disp: 84 capsule, Rfl: 0   glucose blood  (ONETOUCH VERIO) test strip, Once daily testing E11.9, Disp: , Rfl:    JANUVIA 100 MG tablet, Take 100 mg by mouth daily., Disp: , Rfl:    metFORMIN (GLUCOPHAGE-XR) 750 MG 24 hr tablet, Take 750 mg by mouth 2 (two) times daily., Disp: , Rfl:    methocarbamol (ROBAXIN) 500 MG tablet, Take 1 tablet (500 mg total) by mouth every 6 (six) hours as needed for muscle spasms., Disp: 40 tablet, Rfl: 0   omeprazole (PRILOSEC) 40 MG capsule, Take 40 mg by mouth daily., Disp: , Rfl:    ondansetron (ZOFRAN-ODT) 4 MG disintegrating tablet, Take 4 mg by mouth every 8 (eight) hours as needed for nausea or vomiting., Disp: , Rfl:    oxyCODONE (OXY IR/ROXICODONE) 5 MG immediate release tablet, Take 1-2 tablets (5-10 mg total) by mouth every 6 (six) hours as needed for moderate pain or severe pain., Disp: 42 tablet, Rfl: 0   prednisoLONE acetate (PRED FORTE) 1 % ophthalmic suspension, Place 1 drop into the left eye 4 (four) times daily., Disp: , Rfl:    tamsulosin (FLOMAX) 0.4 MG CAPS capsule, Take 1 capsule (0.4 mg total) by mouth daily. (Patient taking differently: Take 0.4 mg by mouth daily as needed (Kidney stones).),  Disp: 30 capsule, Rfl: 0   traZODone (DESYREL) 50 MG tablet, Take 150 mg by mouth at bedtime., Disp: , Rfl:   Social History   Tobacco Use  Smoking Status Never  Smokeless Tobacco Never    Allergies  Allergen Reactions   Nitrofurantoin Macrocrystal Nausea And Vomiting    Makes very sick   Penicillins     From when younger  Tolerated Cephalosporin Date: 12/24/21.     Objective:  There were no vitals filed for this visit. There is no height or weight on file to calculate BMI. Constitutional Well developed. Well nourished.  Vascular Dorsalis pedis pulses palpable bilaterally. Posterior tibial pulses palpable bilaterally. Capillary refill normal to all digits.  No cyanosis or clubbing noted. Pedal hair growth normal.  Neurologic Normal speech. Oriented to person, place, and  time. Epicritic sensation to light touch grossly present bilaterally.  Dermatologic Pain on palpation of the entire/total nail on 1st digit of the left No other open wounds. No skin lesions.  Orthopedic: Normal joint ROM without pain or crepitus bilaterally. No visible deformities. No bony tenderness.   Radiographs: None Assessment:   1. Nail dystrophy    Plan:  Patient was evaluated and treated and all questions answered.  Nail contusion/dystrophy hallux, left -Patient elects to proceed with minor surgery to remove entire toenail today. Consent reviewed and signed by patient. -Entire/total nail excised. See procedure note. -Educated on post-procedure care including soaking. Written instructions provided and reviewed. -Patient to follow up in 2 weeks for nail check.  Procedure: Excision of entire/total nail  Location: Patient alreadyLeft presents to1st toe digit Anesthesia: Lidocaine 1% plain; 1.5 mL and Marcaine 0.5% plain; 1.5 mL, digital block. Skin Prep: Betadine. Dressing: Silvadene; telfa; dry, sterile, compression dressing. Technique: Following skin prep, the toe was exsanguinated and a tourniquet was secured at the base of the toe. The affected nail border was freed and excised. The tourniquet was then removed and sterile dressing applied. Disposition: Patient tolerated procedure well. Patient to return in 2 weeks for follow-up.   No follow-ups on file.

## 2023-08-25 NOTE — Progress Notes (Signed)
 COVID Vaccine Completed: yes  Date of COVID positive in last 90 days:  PCP - Laqueta Linden, PA Cardiologist -   Medical clearance in media tba dated 08/20/23  Chest x-ray -  EKG -  Stress Test -  ECHO -  Cardiac Cath -  Pacemaker/ICD device last checked: Spinal Cord Stimulator:  Bowel Prep -   Sleep Study -  CPAP -   Fasting Blood Sugar -  Checks Blood Sugar _____ times a day  Last dose of GLP1 agonist-  N/A GLP1 instructions:  Hold 7 days before surgery    Last dose of SGLT-2 inhibitors-  N/A SGLT-2 instructions:  Hold 3 days before surgery    Blood Thinner Instructions:  Last dose:   Time: Aspirin Instructions: Last Dose:  Activity level:  Can go up a flight of stairs and perform activities of daily living without stopping and without symptoms of chest pain or shortness of breath.  Able to exercise without symptoms  Unable to go up a flight of stairs without symptoms of     Anesthesia review:  Patient denies shortness of breath, fever, cough and chest pain at PAT appointment  Patient verbalized understanding of instructions that were given to them at the PAT appointment. Patient was also instructed that they will need to review over the PAT instructions again at home before surgery.

## 2023-08-26 ENCOUNTER — Encounter (HOSPITAL_COMMUNITY)
Admission: RE | Admit: 2023-08-26 | Discharge: 2023-08-26 | Disposition: A | Discharge status: Home / Self Care | Source: Ambulatory Visit | Attending: Orthopedic Surgery | Admitting: Orthopedic Surgery

## 2023-08-26 ENCOUNTER — Encounter (HOSPITAL_COMMUNITY): Payer: Self-pay

## 2023-08-26 ENCOUNTER — Other Ambulatory Visit: Payer: Self-pay

## 2023-08-26 VITALS — BP 168/83 | HR 73 | Temp 98.4°F | Resp 16 | Ht 65.0 in | Wt 225.0 lb

## 2023-08-26 DIAGNOSIS — E669 Obesity, unspecified: Secondary | ICD-10-CM | POA: Insufficient documentation

## 2023-08-26 DIAGNOSIS — E119 Type 2 diabetes mellitus without complications: Secondary | ICD-10-CM

## 2023-08-26 DIAGNOSIS — E1169 Type 2 diabetes mellitus with other specified complication: Secondary | ICD-10-CM | POA: Diagnosis not present

## 2023-08-26 DIAGNOSIS — Z6837 Body mass index (BMI) 37.0-37.9, adult: Secondary | ICD-10-CM | POA: Diagnosis not present

## 2023-08-26 DIAGNOSIS — Z01812 Encounter for preprocedural laboratory examination: Secondary | ICD-10-CM | POA: Insufficient documentation

## 2023-08-26 DIAGNOSIS — Z01818 Encounter for other preprocedural examination: Secondary | ICD-10-CM

## 2023-08-26 HISTORY — DX: Anemia, unspecified: D64.9

## 2023-08-26 LAB — SURGICAL PCR SCREEN
MRSA, PCR: NEGATIVE
Staphylococcus aureus: NEGATIVE

## 2023-08-26 LAB — GLUCOSE, CAPILLARY: Glucose-Capillary: 97 mg/dL (ref 70–99)

## 2023-08-26 NOTE — Patient Instructions (Addendum)
 SURGICAL WAITING ROOM VISITATION  Patients having surgery or a procedure may have no more than 2 support people in the waiting area - these visitors may rotate.    Children under the age of 75 must have an adult with them who is not the patient.  Due to an increase in RSV and influenza rates and associated hospitalizations, children ages 29 and under may not visit patients in [facility] hospitals.  Visitors with respiratory illnesses are discouraged from visiting and should remain at home.  If the patient needs to stay at the hospital during part of their recovery, the visitor guidelines for inpatient rooms apply. Pre-op nurse will coordinate an appropriate time for 1 support person to accompany patient in pre-op.  This support person may not rotate.    Please refer to the [facility] website for the visitor guidelines for Inpatients (after your surgery is over and you are in a regular room).    Your procedure is scheduled on: 08/31/23   Report to [facility] Main Entrance    Report to admitting at 5:50 AM   Call this number if you have problems the morning of surgery [phone]   Do not eat food :After Midnight.   After Midnight you may have the following liquids until 5:20 AM DAY OF SURGERY  Water Non-Citrus Juices (without pulp, NO RED-Apple, White grape, White cranberry) Black Coffee (NO MILK/CREAM OR CREAMERS, sugar ok)  Clear Tea (NO MILK/CREAM OR CREAMERS, sugar ok) regular and decaf                             Plain Jell-O (NO RED)                                           Fruit ices (not with fruit pulp, NO RED)                                     Popsicles (NO RED)                                                               Sports drinks like Gatorade (NO RED)                  The day of surgery:  Drink ONE (1) Pre-Surgery G2 at 5:20 AM the morning of surgery. Drink in one sitting. Do not sip.  This drink was given to you during your hospital  pre-op  appointment visit. Nothing else to drink after completing the  Pre-Surgery G2.          If you have questions, please contact your surgeon's office.   FOLLOW BOWEL PREP AND ANY ADDITIONAL PRE OP INSTRUCTIONS YOU RECEIVED FROM YOUR SURGEON'S OFFICE!!!     Oral Hygiene is also important to reduce your risk of infection.                                    Remember - BRUSH  YOUR TEETH THE MORNING OF SURGERY WITH YOUR REGULAR TOOTHPASTE  DENTURES WILL BE REMOVED PRIOR TO SURGERY PLEASE DO NOT APPLY "Poly grip" OR ADHESIVES!!!   Stop all vitamins and herbal supplements 7 days before surgery.   Take these medicines the morning of surgery with A SIP OF WATER: Tylenol, Zyrtec, Gabapentin, Zofran, Tamsulosin   DO NOT TAKE ANY ORAL DIABETIC MEDICATIONS DAY OF YOUR SURGERY  How to Manage Your Diabetes Before and After Surgery  Why is it important to control my blood sugar before and after surgery? Improving blood sugar levels before and after surgery helps healing and can limit problems. A way of improving blood sugar control is eating a healthy diet by:  Eating less sugar and carbohydrates  Increasing activity/exercise  Talking with your doctor about reaching your blood sugar goals High blood sugars (greater than 180 mg/dL) can raise your risk of infections and slow your recovery, so you will need to focus on controlling your diabetes during the weeks before surgery. Make sure that the doctor who takes care of your diabetes knows about your planned surgery including the date and location.  How do I manage my blood sugar before surgery? Check your blood sugar at least 4 times a day, starting 2 days before surgery, to make sure that the level is not too high or low. Check your blood sugar the morning of your surgery when you wake up and every 2 hours until you get to the Short Stay unit. If your blood sugar is less than 70 mg/dL, you will need to treat for low blood sugar: Do not take  insulin. Treat a low blood sugar (less than 70 mg/dL) with  cup of clear juice (cranberry or apple), 4 glucose tablets, OR glucose gel. Recheck blood sugar in 15 minutes after treatment (to make sure it is greater than 70 mg/dL). If your blood sugar is not greater than 70 mg/dL on recheck, call [phone] for further instructions. Report your blood sugar to the short stay nurse when you get to Short Stay.  If you are admitted to the hospital after surgery: Your blood sugar will be checked by the staff and you will probably be given insulin after surgery (instead of oral diabetes medicines) to make sure you have good blood sugar levels. The goal for blood sugar control after surgery is 80-180 mg/dL.   WHAT DO I DO ABOUT MY DIABETES MEDICATION?  Do not take oral diabetes medicines (pills) the morning of surgery.  THE DAY BEFORE SURGERY, take Metformin and Glipizide as prescribed.      THE MORNING OF SURGERY, do not take Metformin or Glipizide   Reviewed and Endorsed by [facility] Patient Education Committee, August 2015                              You may not have any metal on your body including hair pins, jewelry, and body piercing             Do not wear make-up, lotions, powders, perfumes, or deodorant  Do not wear nail polish including gel and S&S, artificial/acrylic nails, or any other type of covering on natural nails including finger and toenails. If you have artificial nails, gel coating, etc. that needs to be removed by a nail salon please have this removed prior to surgery or surgery may need to be canceled/ delayed if the surgeon/ anesthesia feels like they are unable to be  safely monitored.   Do not shave  48 hours prior to surgery.    Do not bring valuables to the hospital. [facility] IS NOT             RESPONSIBLE   FOR VALUABLES.   Contacts, glasses, dentures or bridgework may not be worn into surgery.   Bring small overnight bag day of surgery.   DO NOT BRING  YOUR HOME MEDICATIONS TO THE HOSPITAL. PHARMACY WILL DISPENSE MEDICATIONS LISTED ON YOUR MEDICATION LIST TO YOU DURING YOUR ADMISSION IN THE HOSPITAL!   Special Instructions: Bring a copy of your healthcare power of attorney and living will documents the day of surgery if you haven't scanned them before.              Please read over the following fact sheets you were given: IF YOU HAVE QUESTIONS ABOUT YOUR PRE-OP INSTRUCTIONS PLEASE CALL [phone] Contras    If you received a COVID test during your pre-op visit  it is requested that you wear a mask when out in public, stay away from anyone that may not be feeling well and notify your surgeon if you develop symptoms. If you test positive for Covid or have been in contact with anyone that has tested positive in the last 10 days please notify you surgeon.      Pre-operative 5 CHG Bath Instructions   You can play a key role in reducing the risk of infection after surgery. Your skin needs to be as free of germs as possible. You can reduce the number of germs on your skin by washing with CHG (chlorhexidine gluconate) soap before surgery. CHG is an antiseptic soap that kills germs and continues to kill germs even after washing.   DO NOT use if you have an allergy to chlorhexidine/CHG or antibacterial soaps. If your skin becomes reddened or irritated, stop using the CHG and notify one of our RNs at [phone].   Please shower with the CHG soap starting 4 days before surgery using the following schedule:     Please keep in mind the following:  DO NOT shave, including legs and underarms, starting the day of your first shower.   You may shave your face at any point before/day of surgery.  Place clean sheets on your bed the day you start using CHG soap. Use a clean washcloth (not used since being washed) for each shower. DO NOT sleep with pets once you start using the CHG.   CHG Shower Instructions:  If you choose to wash your hair and  private area, wash first with your normal shampoo/soap.  After you use shampoo/soap, rinse your hair and body thoroughly to remove shampoo/soap residue.  Turn the water OFF and apply about 3 tablespoons (45 ml) of CHG soap to a CLEAN washcloth.  Apply CHG soap ONLY FROM YOUR NECK DOWN TO YOUR TOES (washing for 3-5 minutes)  DO NOT use CHG soap on face, private areas, open wounds, or sores.  Pay special attention to the area where your surgery is being performed.  If you are having back surgery, having someone wash your back for you may be helpful. Wait 2 minutes after CHG soap is applied, then you may rinse off the CHG soap.  Pat dry with a clean towel  Put on clean clothes/pajamas   If you choose to wear lotion, please use ONLY the CHG-compatible lotions on the back of this paper.     Additional instructions for the day of  surgery: DO NOT APPLY any lotions, deodorants, cologne, or perfumes.   Put on clean/comfortable clothes.  Brush your teeth.  Ask your nurse before applying any prescription medications to the skin.      CHG Compatible Lotions   Aveeno Moisturizing lotion  Cetaphil Moisturizing Cream  Cetaphil Moisturizing Lotion  Clairol Herbal Essence Moisturizing Lotion, Dry Skin  Clairol Herbal Essence Moisturizing Lotion, Extra Dry Skin  Clairol Herbal Essence Moisturizing Lotion, Normal Skin  Curel Age Defying Therapeutic Moisturizing Lotion with Alpha Hydroxy  Curel Extreme Care Body Lotion  Curel Soothing Hands Moisturizing Hand Lotion  Curel Therapeutic Moisturizing Cream, Fragrance-Free  Curel Therapeutic Moisturizing Lotion, Fragrance-Free  Curel Therapeutic Moisturizing Lotion, Original Formula  Eucerin Daily Replenishing Lotion  Eucerin Dry Skin Therapy Plus Alpha Hydroxy Crme  Eucerin Dry Skin Therapy Plus Alpha Hydroxy Lotion  Eucerin Original Crme  Eucerin Original Lotion  Eucerin Plus Crme Eucerin Plus Lotion  Eucerin TriLipid Replenishing Lotion  Keri  Anti-Bacterial Hand Lotion  Keri Deep Conditioning Original Lotion Dry Skin Formula Softly Scented  Keri Deep Conditioning Original Lotion, Fragrance Free Sensitive Skin Formula  Keri Lotion Fast Absorbing Fragrance Free Sensitive Skin Formula  Keri Lotion Fast Absorbing Softly Scented Dry Skin Formula  Keri Original Lotion  Keri Skin Renewal Lotion Keri Silky Smooth Lotion  Keri Silky Smooth Sensitive Skin Lotion  Nivea Body Creamy Conditioning Oil  Nivea Body Extra Enriched Lotion  Nivea Body Original Lotion  Nivea Body Sheer Moisturizing Lotion Nivea Crme  Nivea Skin Firming Lotion  NutraDerm 30 Skin Lotion  NutraDerm Skin Lotion  NutraDerm Therapeutic Skin Cream  NutraDerm Therapeutic Skin Lotion  ProShield Protective Hand Cream  Provon moisturizing lotion   Incentive Spirometer  An incentive spirometer is a tool that can help keep your lungs clear and active. This tool measures how well you are filling your lungs with each breath. Taking long deep breaths may help reverse or decrease the chance of developing breathing (pulmonary) problems (especially infection) following: A long period of time when you are unable to move or be active. BEFORE THE PROCEDURE  If the spirometer includes an indicator to show your best effort, your nurse or respiratory therapist will set it to a desired goal. If possible, sit up straight or lean slightly forward. Try not to slouch. Hold the incentive spirometer in an upright position. INSTRUCTIONS FOR USE  Sit on the edge of your bed if possible, or sit up as far as you can in bed or on a chair. Hold the incentive spirometer in an upright position. Breathe out normally. Place the mouthpiece in your mouth and seal your lips tightly around it. Breathe in slowly and as deeply as possible, raising the piston or the ball toward the top of the column. Hold your breath for 3-5 seconds or for as long as possible. Allow the piston or ball to fall to the  bottom of the column. Remove the mouthpiece from your mouth and breathe out normally. Rest for a few seconds and repeat Steps 1 through 7 at least 10 times every 1-2 hours when you are awake. Take your time and take a few normal breaths between deep breaths. The spirometer may include an indicator to show your best effort. Use the indicator as a goal to work toward during each repetition. After each set of 10 deep breaths, practice coughing to be sure your lungs are clear. If you have an incision (the cut made at the time of surgery), support your incision when  coughing by placing a pillow or rolled up towels firmly against it. Once you are able to get out of bed, walk around indoors and cough well. You may stop using the incentive spirometer when instructed by your caregiver.  RISKS AND COMPLICATIONS Take your time so you do not get dizzy or light-headed. If you are in pain, you may need to take or ask for pain medication before doing incentive spirometry. It is harder to take a deep breath if you are having pain. AFTER USE Rest and breathe slowly and easily. It can be helpful to keep track of a log of your progress. Your caregiver can provide you with a simple table to help with this. If you are using the spirometer at home, follow these instructions: SEEK MEDICAL CARE IF:  You are having difficultly using the spirometer. You have trouble using the spirometer as often as instructed. Your pain medication is not giving enough relief while using the spirometer. You develop fever of 100.5 F (38.1 C) or higher. SEEK IMMEDIATE MEDICAL CARE IF:  You cough up bloody sputum that had not been present before. You develop fever of 102 F (38.9 C) or greater. You develop worsening pain at or near the incision site. MAKE SURE YOU:  Understand these instructions. Will watch your condition. Will get help right away if you are not doing well or get worse. Document Released: 09/22/2006 Document Revised:  08/04/2011 Document Reviewed: 11/23/2006 [facility] Patient Information 2014 [place], [place].   ________________________________________________________________________

## 2023-08-31 ENCOUNTER — Encounter (HOSPITAL_COMMUNITY)
Admission: RE | Disposition: A | Discharge status: Home / Self Care | Payer: Self-pay | Source: Ambulatory Visit | Attending: Orthopedic Surgery

## 2023-08-31 ENCOUNTER — Ambulatory Visit (HOSPITAL_COMMUNITY): Admitting: Certified Registered"

## 2023-08-31 ENCOUNTER — Encounter (HOSPITAL_COMMUNITY): Payer: Self-pay | Admitting: Orthopedic Surgery

## 2023-08-31 ENCOUNTER — Other Ambulatory Visit: Payer: Self-pay

## 2023-08-31 ENCOUNTER — Observation Stay (HOSPITAL_COMMUNITY)
Admission: RE | Admit: 2023-08-31 | Discharge: 2023-09-03 | Disposition: A | Discharge status: Home / Self Care | Payer: Medicare Other | Source: Ambulatory Visit | Attending: Orthopedic Surgery | Admitting: Orthopedic Surgery

## 2023-08-31 DIAGNOSIS — M1712 Unilateral primary osteoarthritis, left knee: Principal | ICD-10-CM | POA: Diagnosis present

## 2023-08-31 DIAGNOSIS — Z96641 Presence of right artificial hip joint: Secondary | ICD-10-CM | POA: Diagnosis not present

## 2023-08-31 DIAGNOSIS — E119 Type 2 diabetes mellitus without complications: Secondary | ICD-10-CM | POA: Diagnosis not present

## 2023-08-31 DIAGNOSIS — Z7984 Long term (current) use of oral hypoglycemic drugs: Secondary | ICD-10-CM | POA: Insufficient documentation

## 2023-08-31 DIAGNOSIS — Z96651 Presence of right artificial knee joint: Secondary | ICD-10-CM | POA: Insufficient documentation

## 2023-08-31 DIAGNOSIS — E1169 Type 2 diabetes mellitus with other specified complication: Secondary | ICD-10-CM

## 2023-08-31 DIAGNOSIS — E669 Obesity, unspecified: Secondary | ICD-10-CM

## 2023-08-31 DIAGNOSIS — M179 Osteoarthritis of knee, unspecified: Principal | ICD-10-CM | POA: Diagnosis present

## 2023-08-31 DIAGNOSIS — Z79899 Other long term (current) drug therapy: Secondary | ICD-10-CM | POA: Diagnosis not present

## 2023-08-31 HISTORY — PX: TOTAL KNEE ARTHROPLASTY: SHX125

## 2023-08-31 LAB — GLUCOSE, CAPILLARY
Glucose-Capillary: 121 mg/dL — ABNORMAL HIGH (ref 70–99)
Glucose-Capillary: 92 mg/dL (ref 70–99)
Glucose-Capillary: 173 mg/dL — ABNORMAL HIGH (ref 70–99)
Glucose-Capillary: 281 mg/dL — ABNORMAL HIGH (ref 70–99)
Glucose-Capillary: 240 mg/dL — ABNORMAL HIGH (ref 70–99)

## 2023-08-31 LAB — HEMOGLOBIN A1C
Hgb A1c MFr Bld: 6.7 % — ABNORMAL HIGH (ref 4.8–5.6)
Mean Plasma Glucose: 146 mg/dL

## 2023-08-31 SURGERY — ARTHROPLASTY, KNEE, TOTAL
Anesthesia: Spinal | Site: Knee | Laterality: Left

## 2023-08-31 MED ORDER — OXYCODONE HCL 5 MG PO TABS
5.0000 mg | ORAL_TABLET | ORAL | Status: DC | PRN
  Administered 2023-08-31 – 2023-09-03 (×3): 10 mg via ORAL
  Filled 2023-08-31: qty 1
  Filled 2023-08-31 (×3): qty 2

## 2023-08-31 MED ORDER — TRANEXAMIC ACID-NACL 1000-0.7 MG/100ML-% IV SOLN
1000.0000 mg | INTRAVENOUS | Status: AC
Start: 2023-08-31 — End: 2023-08-31
  Administered 2023-08-31: 1000 mg via INTRAVENOUS
  Filled 2023-08-31: qty 100

## 2023-08-31 MED ORDER — LORATADINE 10 MG PO TABS
10.0000 mg | ORAL_TABLET | Freq: Every day | ORAL | Status: DC
  Administered 2023-08-31 – 2023-09-03 (×4): 10 mg via ORAL
  Filled 2023-08-31 (×4): qty 1

## 2023-08-31 MED ORDER — GLYCOPYRROLATE 0.2 MG/ML IJ SOLN
INTRAMUSCULAR | Status: DC | PRN
  Administered 2023-08-31: .2 mg via INTRAVENOUS

## 2023-08-31 MED ORDER — POLYETHYLENE GLYCOL 3350 17 G PO PACK: 17.0000 g | PACK | Freq: Every day | ORAL | Status: DC | PRN

## 2023-08-31 MED ORDER — SODIUM CHLORIDE (PF) 0.9 % IJ SOLN
INTRAMUSCULAR | Status: DC | PRN
  Administered 2023-08-31: 60 mL

## 2023-08-31 MED ORDER — INSULIN ASPART 100 UNIT/ML IJ SOLN
0.0000 [IU] | Freq: Three times a day (TID) | INTRAMUSCULAR | Status: DC
  Administered 2023-08-31: 8 [IU] via SUBCUTANEOUS
  Administered 2023-09-01 (×2): 3 [IU] via SUBCUTANEOUS
  Administered 2023-09-02: 2 [IU] via SUBCUTANEOUS
  Administered 2023-09-02 – 2023-09-03 (×3): 3 [IU] via SUBCUTANEOUS
  Administered 2023-09-03: 2 [IU] via SUBCUTANEOUS

## 2023-08-31 MED ORDER — ASPIRIN 81 MG PO CHEW
81.0000 mg | CHEWABLE_TABLET | Freq: Two times a day (BID) | ORAL | Status: DC
  Administered 2023-09-01 – 2023-09-03 (×5): 81 mg via ORAL
  Filled 2023-08-31 (×5): qty 1

## 2023-08-31 MED ORDER — DEXAMETHASONE SODIUM PHOSPHATE 10 MG/ML IJ SOLN: 8.0000 mg | Freq: Once | INTRAMUSCULAR | Status: DC

## 2023-08-31 MED ORDER — BUPIVACAINE LIPOSOME 1.3 % IJ SUSP
INTRAMUSCULAR | Status: AC
  Filled 2023-08-31: qty 20

## 2023-08-31 MED ORDER — TRAMADOL HCL 50 MG PO TABS
50.0000 mg | ORAL_TABLET | Freq: Four times a day (QID) | ORAL | Status: DC | PRN
  Administered 2023-09-01: 100 mg via ORAL
  Filled 2023-08-31: qty 2

## 2023-08-31 MED ORDER — PHENYLEPHRINE HCL (PRESSORS) 10 MG/ML IV SOLN
INTRAVENOUS | Status: AC
  Filled 2023-08-31: qty 1

## 2023-08-31 MED ORDER — MIDAZOLAM HCL 2 MG/2ML IJ SOLN: 1.0000 mg | INTRAMUSCULAR | Status: DC

## 2023-08-31 MED ORDER — SODIUM CHLORIDE (PF) 0.9 % IJ SOLN
INTRAMUSCULAR | Status: AC
  Filled 2023-08-31: qty 50

## 2023-08-31 MED ORDER — POVIDONE-IODINE 10 % EX SWAB: 2.0000 | Freq: Once | CUTANEOUS | Status: DC

## 2023-08-31 MED ORDER — MORPHINE SULFATE (PF) 2 MG/ML IV SOLN
1.0000 mg | INTRAVENOUS | Status: DC | PRN
  Administered 2023-09-02 (×2): 2 mg via INTRAVENOUS
  Filled 2023-08-31 (×2): qty 1

## 2023-08-31 MED ORDER — OXYCODONE HCL 5 MG PO TABS: 5.0000 mg | ORAL_TABLET | Freq: Once | ORAL | Status: DC | PRN

## 2023-08-31 MED ORDER — PHENYLEPHRINE HCL-NACL 20-0.9 MG/250ML-% IV SOLN
INTRAVENOUS | Status: DC | PRN
  Administered 2023-08-31: 30 ug/min via INTRAVENOUS

## 2023-08-31 MED ORDER — TAMSULOSIN HCL 0.4 MG PO CAPS: 0.4000 mg | ORAL_CAPSULE | Freq: Every day | ORAL | Status: DC | PRN

## 2023-08-31 MED ORDER — DIPHENHYDRAMINE HCL 12.5 MG/5ML PO ELIX: 12.5000 mg | ORAL_SOLUTION | ORAL | Status: DC | PRN

## 2023-08-31 MED ORDER — ONDANSETRON HCL 4 MG/2ML IJ SOLN: 4.0000 mg | Freq: Once | INTRAMUSCULAR | Status: DC | PRN

## 2023-08-31 MED ORDER — FLEET ENEMA RE ENEM: 1.0000 | ENEMA | Freq: Once | RECTAL | Status: DC | PRN

## 2023-08-31 MED ORDER — SODIUM CHLORIDE 0.9 % IV SOLN: INTRAVENOUS | Status: DC

## 2023-08-31 MED ORDER — SODIUM CHLORIDE (PF) 0.9 % IJ SOLN
INTRAMUSCULAR | Status: AC
  Filled 2023-08-31: qty 10

## 2023-08-31 MED ORDER — BISACODYL 10 MG RE SUPP: 10.0000 mg | Freq: Every day | RECTAL | Status: DC | PRN

## 2023-08-31 MED ORDER — CEFAZOLIN SODIUM-DEXTROSE 2-4 GM/100ML-% IV SOLN
2.0000 g | INTRAVENOUS | Status: AC
Start: 2023-08-31 — End: 2023-08-31
  Administered 2023-08-31: 2 g via INTRAVENOUS
  Filled 2023-08-31: qty 100

## 2023-08-31 MED ORDER — BUPIVACAINE IN DEXTROSE 0.75-8.25 % IT SOLN
INTRATHECAL | Status: DC | PRN
  Administered 2023-08-31: 1.8 mL via INTRATHECAL

## 2023-08-31 MED ORDER — GLIPIZIDE ER 5 MG PO TB24
10.0000 mg | ORAL_TABLET | Freq: Every day | ORAL | Status: DC
  Administered 2023-09-01 – 2023-09-03 (×3): 10 mg via ORAL
  Filled 2023-08-31 (×3): qty 2

## 2023-08-31 MED ORDER — CEFAZOLIN SODIUM-DEXTROSE 2-4 GM/100ML-% IV SOLN
2.0000 g | Freq: Four times a day (QID) | INTRAVENOUS | Status: AC
  Administered 2023-08-31 (×2): 2 g via INTRAVENOUS
  Filled 2023-08-31 (×2): qty 100

## 2023-08-31 MED ORDER — ALPRAZOLAM 0.25 MG PO TABS
0.2500 mg | ORAL_TABLET | Freq: Every day | ORAL | Status: DC
  Administered 2023-08-31 – 2023-09-02 (×3): 0.25 mg via ORAL
  Filled 2023-08-31 (×3): qty 1

## 2023-08-31 MED ORDER — GABAPENTIN 100 MG PO CAPS
100.0000 mg | ORAL_CAPSULE | Freq: Two times a day (BID) | ORAL | Status: DC
Start: 2023-08-31 — End: 2023-09-03
  Administered 2023-08-31 – 2023-09-03 (×7): 100 mg via ORAL
  Filled 2023-08-31 (×7): qty 1

## 2023-08-31 MED ORDER — LACTATED RINGERS IV SOLN: INTRAVENOUS | Status: DC

## 2023-08-31 MED ORDER — FLUOXETINE HCL 20 MG PO CAPS
40.0000 mg | ORAL_CAPSULE | Freq: Every day | ORAL | Status: DC
  Administered 2023-08-31 – 2023-09-02 (×3): 40 mg via ORAL
  Filled 2023-08-31 (×3): qty 2

## 2023-08-31 MED ORDER — FENTANYL CITRATE PF 50 MCG/ML IJ SOSY: 25.0000 ug | PREFILLED_SYRINGE | INTRAMUSCULAR | Status: DC | PRN

## 2023-08-31 MED ORDER — ACETAMINOPHEN 10 MG/ML IV SOLN
1000.0000 mg | Freq: Once | INTRAVENOUS | Status: AC
Start: 2023-08-31 — End: 2023-08-31
  Administered 2023-08-31: 1000 mg via INTRAVENOUS
  Filled 2023-08-31: qty 100

## 2023-08-31 MED ORDER — ONDANSETRON HCL 4 MG/2ML IJ SOLN
INTRAMUSCULAR | Status: DC | PRN
  Administered 2023-08-31: 4 mg via INTRAVENOUS

## 2023-08-31 MED ORDER — ONDANSETRON HCL 4 MG PO TABS: 4.0000 mg | ORAL_TABLET | Freq: Four times a day (QID) | ORAL | Status: DC | PRN

## 2023-08-31 MED ORDER — BUPIVACAINE LIPOSOME 1.3 % IJ SUSP
INTRAMUSCULAR | Status: DC | PRN
  Administered 2023-08-31: 20 mL

## 2023-08-31 MED ORDER — DOCUSATE SODIUM 100 MG PO CAPS
100.0000 mg | ORAL_CAPSULE | Freq: Two times a day (BID) | ORAL | Status: DC
  Administered 2023-08-31 – 2023-09-03 (×7): 100 mg via ORAL
  Filled 2023-08-31 (×7): qty 1

## 2023-08-31 MED ORDER — ACETAMINOPHEN 325 MG PO TABS: 325.0000 mg | ORAL_TABLET | Freq: Four times a day (QID) | ORAL | Status: DC | PRN

## 2023-08-31 MED ORDER — ACETAMINOPHEN 10 MG/ML IV SOLN: 1000.0000 mg | Freq: Once | INTRAVENOUS | Status: DC | PRN

## 2023-08-31 MED ORDER — MENTHOL 3 MG MT LOZG: 1.0000 | LOZENGE | OROMUCOSAL | Status: DC | PRN

## 2023-08-31 MED ORDER — OXYCODONE HCL 5 MG PO TABS
10.0000 mg | ORAL_TABLET | ORAL | Status: DC | PRN
  Administered 2023-08-31 – 2023-09-01 (×3): 10 mg via ORAL
  Administered 2023-09-01: 15 mg via ORAL
  Administered 2023-09-01 – 2023-09-02 (×2): 10 mg via ORAL
  Administered 2023-09-02: 15 mg via ORAL
  Administered 2023-09-02: 10 mg via ORAL
  Administered 2023-09-02: 15 mg via ORAL
  Administered 2023-09-02: 10 mg via ORAL
  Administered 2023-09-03 (×2): 15 mg via ORAL
  Filled 2023-08-31: qty 2
  Filled 2023-08-31: qty 3
  Filled 2023-08-31: qty 2
  Filled 2023-08-31: qty 3
  Filled 2023-08-31 (×2): qty 2
  Filled 2023-08-31: qty 3
  Filled 2023-08-31 (×2): qty 2
  Filled 2023-08-31: qty 3
  Filled 2023-08-31: qty 2
  Filled 2023-08-31: qty 3

## 2023-08-31 MED ORDER — DEXAMETHASONE SODIUM PHOSPHATE 10 MG/ML IJ SOLN
INTRAMUSCULAR | Status: DC | PRN
  Administered 2023-08-31: 8 mg

## 2023-08-31 MED ORDER — CYCLOSPORINE 0.05 % OP EMUL
1.0000 [drp] | Freq: Two times a day (BID) | OPHTHALMIC | Status: DC
  Administered 2023-08-31 – 2023-09-03 (×7): 1 [drp] via OPHTHALMIC
  Filled 2023-08-31 (×7): qty 30

## 2023-08-31 MED ORDER — PHENOL 1.4 % MT LIQD: 1.0000 | OROMUCOSAL | Status: DC | PRN

## 2023-08-31 MED ORDER — STERILE WATER FOR IRRIGATION IR SOLN
Status: DC | PRN
  Administered 2023-08-31: 1000 mL

## 2023-08-31 MED ORDER — 0.9 % SODIUM CHLORIDE (POUR BTL) OPTIME
TOPICAL | Status: DC | PRN
  Administered 2023-08-31: 1000 mL

## 2023-08-31 MED ORDER — METOCLOPRAMIDE HCL 5 MG PO TABS: 5.0000 mg | ORAL_TABLET | Freq: Three times a day (TID) | ORAL | Status: DC | PRN

## 2023-08-31 MED ORDER — METOCLOPRAMIDE HCL 5 MG/ML IJ SOLN: 5.0000 mg | Freq: Three times a day (TID) | INTRAMUSCULAR | Status: DC | PRN

## 2023-08-31 MED ORDER — METHOCARBAMOL 1000 MG/10ML IJ SOLN: 500.0000 mg | Freq: Four times a day (QID) | INTRAMUSCULAR | Status: DC | PRN

## 2023-08-31 MED ORDER — SODIUM CHLORIDE 0.9 % IR SOLN
Status: DC | PRN
  Administered 2023-08-31: 1000 mL

## 2023-08-31 MED ORDER — FENTANYL CITRATE PF 50 MCG/ML IJ SOSY
50.0000 ug | PREFILLED_SYRINGE | INTRAMUSCULAR | Status: DC
  Administered 2023-08-31: 50 ug via INTRAVENOUS
  Filled 2023-08-31: qty 2

## 2023-08-31 MED ORDER — METHOCARBAMOL 500 MG PO TABS
500.0000 mg | ORAL_TABLET | Freq: Four times a day (QID) | ORAL | Status: DC | PRN
  Administered 2023-08-31 – 2023-09-03 (×6): 500 mg via ORAL
  Filled 2023-08-31 (×7): qty 1

## 2023-08-31 MED ORDER — CLONIDINE HCL (ANALGESIA) 100 MCG/ML EP SOLN
EPIDURAL | Status: DC | PRN
  Administered 2023-08-31: 100 ug

## 2023-08-31 MED ORDER — CHLORHEXIDINE GLUCONATE 0.12 % MT SOLN
15.0000 mL | Freq: Once | OROMUCOSAL | Status: AC
  Administered 2023-08-31: 15 mL via OROMUCOSAL

## 2023-08-31 MED ORDER — ROPIVACAINE HCL 5 MG/ML IJ SOLN
INTRAMUSCULAR | Status: DC | PRN
  Administered 2023-08-31: 20 mL via PERINEURAL

## 2023-08-31 MED ORDER — INSULIN ASPART 100 UNIT/ML IJ SOLN: 0.0000 [IU] | INTRAMUSCULAR | Status: DC | PRN

## 2023-08-31 MED ORDER — ONDANSETRON HCL 4 MG/2ML IJ SOLN: 4.0000 mg | Freq: Four times a day (QID) | INTRAMUSCULAR | Status: DC | PRN

## 2023-08-31 MED ORDER — BUPIVACAINE LIPOSOME 1.3 % IJ SUSP: 20.0000 mL | Freq: Once | INTRAMUSCULAR | Status: DC

## 2023-08-31 MED ORDER — ACETAMINOPHEN 500 MG PO TABS
1000.0000 mg | ORAL_TABLET | Freq: Four times a day (QID) | ORAL | Status: AC
  Administered 2023-08-31 – 2023-09-01 (×3): 1000 mg via ORAL
  Filled 2023-08-31 (×4): qty 2

## 2023-08-31 MED ORDER — OXYCODONE HCL 5 MG/5ML PO SOLN: 5.0000 mg | Freq: Once | ORAL | Status: DC | PRN

## 2023-08-31 MED ORDER — PROPOFOL 500 MG/50ML IV EMUL
INTRAVENOUS | Status: DC | PRN
  Administered 2023-08-31: 85 ug/kg/min via INTRAVENOUS

## 2023-08-31 MED ORDER — ORAL CARE MOUTH RINSE: 15.0000 mL | Freq: Once | OROMUCOSAL | Status: AC

## 2023-08-31 SURGICAL SUPPLY — 48 items
ATTUNE MED DOME PAT 38 KNEE (Knees) IMPLANT
ATTUNE PS FEM LT SZ 5 CEM KNEE (Femur) IMPLANT
ATTUNE PSRP INSR SZ5 8 KNEE (Insert) IMPLANT
BAG COUNTER SPONGE SURGICOUNT (BAG) IMPLANT
BAG ZIPLOCK 12X15 (MISCELLANEOUS) ×2 IMPLANT
BASE TIBIAL ROT PLAT SZ 5 KNEE (Knees) IMPLANT
BLADE SAG 18X100X1.27 (BLADE) ×2 IMPLANT
BLADE SAW SGTL 11.0X1.19X90.0M (BLADE) ×2 IMPLANT
BNDG ELASTIC 6INX 5YD STR LF (GAUZE/BANDAGES/DRESSINGS) ×2 IMPLANT
BOWL SMART MIX CTS (DISPOSABLE) ×2 IMPLANT
CEMENT HV SMART SET (Cement) ×4 IMPLANT
COVER SURGICAL LIGHT HANDLE (MISCELLANEOUS) ×2 IMPLANT
CUFF TRNQT CYL 34X4.125X (TOURNIQUET CUFF) ×2 IMPLANT
DERMABOND ADVANCED .7 DNX12 (GAUZE/BANDAGES/DRESSINGS) ×2 IMPLANT
DRAPE U-SHAPE 47X51 STRL (DRAPES) ×2 IMPLANT
DRSG AQUACEL AG ADV 3.5X10 (GAUZE/BANDAGES/DRESSINGS) ×2 IMPLANT
DURAPREP 26ML APPLICATOR (WOUND CARE) ×2 IMPLANT
ELECT PENCIL ROCKER SW 15FT (MISCELLANEOUS) ×2 IMPLANT
ELECT REM PT RETURN 15FT ADLT (MISCELLANEOUS) ×2 IMPLANT
GLOVE BIO SURGEON STRL SZ 6.5 (GLOVE) IMPLANT
GLOVE BIO SURGEON STRL SZ7 (GLOVE) IMPLANT
GLOVE BIO SURGEON STRL SZ8 (GLOVE) ×2 IMPLANT
GLOVE BIOGEL PI IND STRL 7.0 (GLOVE) IMPLANT
GLOVE BIOGEL PI IND STRL 8 (GLOVE) ×2 IMPLANT
GOWN STRL REUS W/ TWL LRG LVL3 (GOWN DISPOSABLE) ×2 IMPLANT
HOLDER FOLEY CATH W/STRAP (MISCELLANEOUS) IMPLANT
IMMOBILIZER KNEE 20 (SOFTGOODS) ×1 IMPLANT
IMMOBILIZER KNEE 20 THIGH 36 (SOFTGOODS) ×2 IMPLANT
KIT TURNOVER KIT A (KITS) IMPLANT
MANIFOLD NEPTUNE II (INSTRUMENTS) ×2 IMPLANT
NS IRRIG 1000ML POUR BTL (IV SOLUTION) ×2 IMPLANT
PACK TOTAL KNEE CUSTOM (KITS) ×2 IMPLANT
PADDING CAST COTTON 6X4 STRL (CAST SUPPLIES) ×4 IMPLANT
PIN STEINMAN FIXATION KNEE (PIN) IMPLANT
PROTECTOR NERVE ULNAR (MISCELLANEOUS) ×2 IMPLANT
SET HNDPC FAN SPRY TIP SCT (DISPOSABLE) ×2 IMPLANT
SUT MNCRL AB 4-0 PS2 18 (SUTURE) ×2 IMPLANT
SUT STRATAFIX 0 PDS 27 VIOLET (SUTURE) ×1 IMPLANT
SUT VIC AB 0 CT1 36 (SUTURE) ×2 IMPLANT
SUT VIC AB 2-0 CT1 TAPERPNT 27 (SUTURE) ×6 IMPLANT
SUTURE STRATFX 0 PDS 27 VIOLET (SUTURE) ×2 IMPLANT
TIBIAL BASE ROT PLAT SZ 5 KNEE (Knees) ×1 IMPLANT
TOWEL GREEN STERILE FF (TOWEL DISPOSABLE) ×2 IMPLANT
TRAY FOLEY MTR SLVR 16FR STAT (SET/KITS/TRAYS/PACK) IMPLANT
TRAY FOLEY SLVR 14FR TEMP STAT (SET/KITS/TRAYS/PACK) IMPLANT
TUBE SUCTION HIGH CAP CLEAR NV (SUCTIONS) ×2 IMPLANT
WATER STERILE IRR 1000ML POUR (IV SOLUTION) ×4 IMPLANT
WRAP KNEE MAXI GEL POST OP (GAUZE/BANDAGES/DRESSINGS) ×2 IMPLANT

## 2023-08-31 NOTE — Evaluation (Signed)
 Physical Therapy Evaluation Patient Details Name: Olivia Walker MRN: [id number] DOB: 1950/10/06 Today's Date: 08/31/2023  History of Present Illness  Pt is 73 yo female s/p L TKA on 08/31/23.  Pt with hx including but not limited to R TKA, OA, GERD, R THA  Clinical Impression  Pt is s/p TKA resulting in the deficits listed below (see PT Problem List). At baseline, pt is independent.  She did have some difficulty with community ambulation due to knee pain.  Pt has support and DME at home.  Today, pt with excellent quad activation and initially reports sensation in feet intact, but upon standing still with some numbness.  Took a few steps but unsteady balance requiring min A so returned to bed.   Pt expected to progress well as spinal anesthesia wears off.  She did report increased difficulty with recovery after R TKA; however, reports already doing better with L side.  Pt will benefit from acute skilled PT to increase their independence and safety with mobility to allow discharge.          If plan is discharge home, recommend the following: A little help with walking and/or transfers;A little help with bathing/dressing/bathroom;Help with stairs or ramp for entrance   Can travel by private vehicle        Equipment Recommendations None recommended by PT  Recommendations for Other Services       Functional Status Assessment Patient has had a recent decline in their functional status and demonstrates the ability to make significant improvements in function in a reasonable and predictable amount of time.     Precautions / Restrictions Precautions Precautions: Fall;Knee Required Braces or Orthoses: Knee Immobilizer - Left Knee Immobilizer - Left: Discontinue once straight leg raise with < 10 degree lag Restrictions Weight Bearing Restrictions Per Provider Order: Yes LLE Weight Bearing Per Provider Order: Weight bearing as tolerated      Mobility  Bed Mobility Overal bed mobility: Needs  Assistance Bed Mobility: Supine to Sit, Sit to Supine     Supine to sit: Supervision Sit to supine: Supervision        Transfers Overall transfer level: Needs assistance Equipment used: Rolling walker (2 wheels) Transfers: Sit to/from Stand Sit to Stand: Min assist           General transfer comment: Cues for hand placement ; light min A to steady    Ambulation/Gait Ambulation/Gait assistance: Min assist Gait Distance (Feet): 4 Feet Assistive device: Rolling walker (2 wheels) Gait Pattern/deviations: Step-to pattern, Decreased stride length, Decreased weight shift to left Gait velocity: decreased     General Gait Details: Unsteady balance with min A to stabilize, good stability in L knee (no buckling).  Only took a couple steps and returned to bed.  Pt had initially reported feet sensation intact, however, upon standing reports "feels like balloons."  Stairs            Wheelchair Mobility     Tilt Bed    Modified Rankin (Stroke Patients Only)       Balance Overall balance assessment: Needs assistance Sitting-balance support: No upper extremity supported Sitting balance-Leahy Scale: Good     Standing balance support: Bilateral upper extremity supported Standing balance-Leahy Scale: Poor Standing balance comment: needs RW and occasional min A                             Pertinent Vitals/Pain Pain Assessment Pain Assessment: No/denies pain  Home Living Family/patient expects to be discharged to:: Private residence Living Arrangements: Non-relatives/Friends Trey Paula) Available Help at Discharge: Family;Available 24 hours/day Type of Home: House Home Access: Other (comment) (threshold)       Home Layout: Two level;Able to live on main level with bedroom/bathroom Home Equipment: Rolling Walker (2 wheels);Wheelchair - manual;Grab bars - tub/shower;Grab bars - toilet;Cane - single point      Prior Function Prior Level of Function :  Independent/Modified Independent             Mobility Comments: Ambulated without AD but reports "hobbled."  Only able to do short distance community due to knee pain. ADLs Comments: independent adls and iadls     Extremity/Trunk Assessment   Upper Extremity Assessment Upper Extremity Assessment: Overall WFL for tasks assessed    Lower Extremity Assessment Lower Extremity Assessment: RLE deficits/detail;LLE deficits/detail RLE Deficits / Details: ROM WFL; MMT 5/5 LLE Deficits / Details: Expected post op changes; ROM 0 to 90 degrees; MMT: ankle 5/5, knee and hip 3/5 not further tested; able to SLR wtihout any LAG LLE Sensation: decreased light touch (Bil feet initially reports sensation intact ; however, upon standing states "feels like on balloons.")    Cervical / Trunk Assessment Cervical / Trunk Assessment: Normal  Communication        Cognition Arousal: Alert Behavior During Therapy: WFL for tasks assessed/performed   PT - Cognitive impairments: No apparent impairments                                 Cueing       General Comments      Exercises     Assessment/Plan    PT Assessment Patient needs continued PT services  PT Problem List Decreased strength;Pain;Decreased range of motion;Decreased activity tolerance;Decreased knowledge of use of DME;Decreased balance;Decreased mobility       PT Treatment Interventions DME instruction;Therapeutic exercise;Gait training;Balance training;Stair training;Functional mobility training;Therapeutic activities;Patient/family education;Modalities    PT Goals (Current goals can be found in the Care Plan section)  Acute Rehab PT Goals Patient Stated Goal: return home; hoping for easier recovery thatn R side - reports in hospital 4 days last time PT Goal Formulation: With patient/family Time For Goal Achievement: 09/14/23 Potential to Achieve Goals: Good    Frequency 7X/week     Co-evaluation                AM-PAC PT "6 Clicks" Mobility  Outcome Measure Help needed turning from your back to your side while in a flat bed without using bedrails?: A Little Help needed moving from lying on your back to sitting on the side of a flat bed without using bedrails?: A Little Help needed moving to and from a bed to a chair (including a wheelchair)?: A Little Help needed standing up from a chair using your arms (e.g., wheelchair or bedside chair)?: A Little Help needed to walk in hospital room?: A Little Help needed climbing 3-5 steps with a railing? : Total 6 Click Score: 16    End of Session Equipment Utilized During Treatment: Gait belt Activity Tolerance: Patient tolerated treatment well Patient left: in bed;with call bell/phone within reach;with bed alarm set;with SCD's reapplied Nurse Communication: Mobility status PT Visit Diagnosis: Other abnormalities of gait and mobility (R26.89);Muscle weakness (generalized) (M62.81)    Time: 5621-3086 PT Time Calculation (min) (ACUTE ONLY): 25 min   Charges:   PT Evaluation $PT Eval Low  Complexity: 1 Low PT Treatments $Therapeutic Activity: 8-22 mins PT General Charges $$ ACUTE PT VISIT: 1 Visit         Anise Salvo, PT Acute Rehab [facility] [phone]   Rayetta Humphrey 08/31/2023, 1:21 PM

## 2023-08-31 NOTE — Anesthesia Postprocedure Evaluation (Signed)
 Anesthesia Post Note  Patient: Olivia Walker  Procedure(s) Performed: ARTHROPLASTY, KNEE, TOTAL (Left: Knee)     Patient location during evaluation: PACU Anesthesia Type: Spinal Level of consciousness: awake and alert Pain management: pain level controlled Vital Signs Assessment: post-procedure vital signs reviewed and stable Respiratory status: spontaneous breathing, nonlabored ventilation, respiratory function stable and patient connected to nasal cannula oxygen Cardiovascular status: blood pressure returned to baseline and stable Postop Assessment: no apparent nausea or vomiting Anesthetic complications: no   No notable events documented.  Last Vitals:  Vitals:   08/31/23 1145 08/31/23 1210  BP: (!) 149/85 (!) 143/70  Pulse: 82 89  Resp: 17 14  Temp:  36.9 C  SpO2: 93% 93%    Last Pain:  Vitals:   08/31/23 1210  TempSrc: Oral  PainSc:                  Mariann Barter

## 2023-08-31 NOTE — Transfer of Care (Signed)
 Immediate Anesthesia Transfer of Care Note  Patient: Olivia Walker  Procedure(s) Performed: ARTHROPLASTY, KNEE, TOTAL (Left: Knee)  Patient Location: PACU  Anesthesia Type:Spinal and MAC combined with regional for post-op pain  Level of Consciousness: awake, alert , oriented, and patient cooperative  Airway & Oxygen Therapy: Patient Spontanous Breathing and Patient connected to face mask oxygen  Post-op Assessment: Report given to RN and Post -op Vital signs reviewed and stable  Post vital signs: Reviewed and stable  Last Vitals:  Vitals Value Taken Time  BP    Temp    Pulse 73 08/31/23 0946  Resp 17 08/31/23 0946  SpO2 97 % 08/31/23 0946  Vitals shown include unfiled device data.  Last Pain:  Vitals:   08/31/23 0656  TempSrc:   PainSc: 0-No pain         Complications: No notable events documented.

## 2023-08-31 NOTE — Progress Notes (Signed)
 Orthopedic Tech Progress Note Patient Details:  Olivia Walker 1951-02-27 [id number]  CPM Left Knee CPM Left Knee: On Left Knee Flexion (Degrees): 40 Left Knee Extension (Degrees): 10  Post Interventions Patient Tolerated: Fair  [place] 08/31/2023, 2:54 PM

## 2023-08-31 NOTE — Interval H&P Note (Signed)
 History and Physical Interval Note:  08/31/2023 6:36 AM  Olivia Walker  has presented today for surgery, with the diagnosis of left knee osteoarthritis.  The various methods of treatment have been discussed with the patient and family. After consideration of risks, benefits and other options for treatment, the patient has consented to  Procedure(s): ARTHROPLASTY, KNEE, TOTAL (Left) as a surgical intervention.  The patient's history has been reviewed, patient examined, no change in status, stable for surgery.  I have reviewed the patient's chart and labs.  Questions were answered to the patient's satisfaction.     Homero Fellers Quanah Majka

## 2023-08-31 NOTE — Discharge Instructions (Signed)
 Olivia Gross, MD Total Joint Specialist [facility] [address], [address] [place], [place] [address] [phone]  TOTAL KNEE REPLACEMENT POSTOPERATIVE DIRECTIONS    Knee Rehabilitation, Guidelines Following Surgery  Results after knee surgery are often greatly improved when you follow the exercise, range of motion and muscle strengthening exercises prescribed by your doctor. Safety measures are also important to protect the knee from further injury. If any of these exercises cause you to have increased pain or swelling in your knee joint, decrease the amount until you are comfortable again and slowly increase them. If you have problems or questions, call your caregiver or physical therapist for advice.   BLOOD CLOT PREVENTION Take 81 mg Aspirin two times a day for three weeks following surgery. Then take an 81 mg Aspirin once a day for three weeks. Then discontinue Aspirin. You may resume your vitamins/supplements upon discharge from the hospital. Do not take any NSAIDs (Advil, Aleve, Ibuprofen, Meloxicam, etc.) for 3 weeks, while taking 81mg  Aspirin twice a day.   HOME CARE INSTRUCTIONS  Remove items at home which could result in a fall. This includes throw rugs or furniture in walking pathways.  ICE to the affected knee as much as tolerated. Icing helps control swelling. If the swelling is well controlled you will be more comfortable and rehab easier. Continue to use ice on the knee for pain and swelling from surgery. You may notice swelling that will progress down to the foot and ankle. This is normal after surgery. Elevate the leg when you are not up walking on it.    Continue to use the breathing machine which will help keep your temperature down. It is common for your temperature to cycle up and down following surgery, especially at night when you are not up moving around and exerting yourself. The breathing machine keeps your lungs expanded and your temperature  down. Do not place pillow under the operative knee, focus on keeping the knee straight while resting  DIET You may resume your previous home diet once you are discharged from the hospital.  DRESSING / WOUND CARE / SHOWERING Keep your bulky bandage on for 2 days. On the third post-operative day you may remove the Ace bandage and gauze. There is a waterproof adhesive bandage on your skin which will stay in place until your first follow-up appointment. Once you remove this you will not need to place another bandage You may begin showering 3 days following surgery, but do not submerge the incision under water.  ACTIVITY For the first 5 days, the key is rest and control of pain and swelling Do your home exercises twice a day starting on post-operative day 3. On the days you go to physical therapy, just do the home exercises once that day. You should rest, ice and elevate the leg for 50 minutes out of every hour. Get up and walk/stretch for 10 minutes per hour. After 5 days you can increase your activity slowly as tolerated. Walk with your walker as instructed. Use the walker until you are comfortable transitioning to a cane. Walk with the cane in the opposite hand of the operative leg. You may discontinue the cane once you are comfortable and walking steadily. Avoid periods of inactivity such as sitting longer than an hour when not asleep. This helps prevent blood clots.  You may discontinue the knee immobilizer once you are able to perform a straight leg raise while lying down. You may resume a sexual relationship in  one month or when given the OK by your doctor.  You may return to work once you are cleared by your doctor.  Do not drive a car for 6 weeks or until released by your surgeon.  Do not drive while taking narcotics.  TED HOSE STOCKINGS Wear the elastic stockings on both legs for three weeks following surgery during the day. You may remove them at night for sleeping.  WEIGHT  BEARING Weight bearing as tolerated with assist device (walker, cane, etc) as directed, use it as long as suggested by your surgeon or therapist, typically at least 4-6 weeks.  POSTOPERATIVE CONSTIPATION PROTOCOL Constipation - defined medically as fewer than three stools per week and severe constipation as less than one stool per week.  One of the most common issues patients have following surgery is constipation.  Even if you have a regular bowel pattern at home, your normal regimen is likely to be disrupted due to multiple reasons following surgery.  Combination of anesthesia, postoperative narcotics, change in appetite and fluid intake all can affect your bowels.  In order to avoid complications following surgery, here are some recommendations in order to help you during your recovery period.  Colace (docusate) - Pick up an over-the-counter form of Colace or another stool softener and take twice a day as long as you are requiring postoperative pain medications.  Take with a full glass of water daily.  If you experience loose stools or diarrhea, hold the colace until you stool forms back up. If your symptoms do not get better within 1 week or if they get worse, check with your doctor. Dulcolax (bisacodyl) - Pick up over-the-counter and take as directed by the product packaging as needed to assist with the movement of your bowels.  Take with a full glass of water.  Use this product as needed if not relieved by Colace only.  MiraLax (polyethylene glycol) - Pick up over-the-counter to have on hand. MiraLax is a solution that will increase the amount of water in your bowels to assist with bowel movements.  Take as directed and can mix with a glass of water, juice, soda, coffee, or tea. Take if you go more than two days without a movement. Do not use MiraLax more than once per day. Call your doctor if you are still constipated or irregular after using this medication for 7 days in a row.  If you continue  to have problems with postoperative constipation, please contact the office for further assistance and recommendations.  If you experience "the worst abdominal pain ever" or develop nausea or vomiting, please contact the office immediatly for further recommendations for treatment.  ITCHING If you experience itching with your medications, try taking only a single pain pill, or even half a pain pill at a time.  You can also use Benadryl over the counter for itching or also to help with sleep.   MEDICATIONS See your medication summary on the "After Visit Summary" that the nursing staff will review with you prior to discharge.  You may have some home medications which will be placed on hold until you complete the course of blood thinner medication.  It is important for you to complete the blood thinner medication as prescribed by your surgeon.  Continue your approved medications as instructed at time of discharge.  PRECAUTIONS If you experience chest pain or shortness of breath - call 911 immediately for transfer to the hospital emergency department.  If you develop a fever greater that  101 F, purulent drainage from wound, increased redness or drainage from wound, foul odor from the wound/dressing, or calf pain - CONTACT YOUR SURGEON.                                                   FOLLOW-UP APPOINTMENTS Make sure you keep all of your appointments after your operation with your surgeon and caregivers. You should call the office at the above phone number and make an appointment for approximately two weeks after the date of your surgery or on the date instructed by your surgeon outlined in the "After Visit Summary".  RANGE OF MOTION AND STRENGTHENING EXERCISES  Rehabilitation of the knee is important following a knee injury or an operation. After just a few days of immobilization, the muscles of the thigh which control the knee become weakened and shrink (atrophy). Knee exercises are designed to build up  the tone and strength of the thigh muscles and to improve knee motion. Often times heat used for twenty to thirty minutes before working out will loosen up your tissues and help with improving the range of motion but do not use heat for the first two weeks following surgery. These exercises can be done on a training (exercise) mat, on the floor, on a table or on a bed. Use what ever works the best and is most comfortable for you Knee exercises include:  Leg Lifts - While your knee is still immobilized in a splint or cast, you can do straight leg raises. Lift the leg to 60 degrees, hold for 3 sec, and slowly lower the leg. Repeat 10-20 times 2-3 times daily. Perform this exercise against resistance later as your knee gets better.  Quad and Hamstring Sets - Tighten up the muscle on the front of the thigh (Quad) and hold for 5-10 sec. Repeat this 10-20 times hourly. Hamstring sets are done by pushing the foot backward against an object and holding for 5-10 sec. Repeat as with quad sets.  Leg Slides: Lying on your back, slowly slide your foot toward your buttocks, bending your knee up off the floor (only go as far as is comfortable). Then slowly slide your foot back down until your leg is flat on the floor again. Angel Wings: Lying on your back spread your legs to the side as far apart as you can without causing discomfort.  A rehabilitation program following serious knee injuries can speed recovery and prevent re-injury in the future due to weakened muscles. Contact your doctor or a physical therapist for more information on knee rehabilitation.   POST-OPERATIVE OPIOID TAPER INSTRUCTIONS: It is important to wean off of your opioid medication as soon as possible. If you do not need pain medication after your surgery it is ok to stop day one. Opioids include: Codeine, Hydrocodone(Norco, Vicodin), Oxycodone(Percocet, oxycontin) and hydromorphone amongst others.  Long term and even short term use of opiods can  cause: Increased pain response Dependence Constipation Depression Respiratory depression And more.  Withdrawal symptoms can include Flu like symptoms Nausea, vomiting And more Techniques to manage these symptoms Hydrate well Eat regular healthy meals Stay active Use relaxation techniques(deep breathing, meditating, yoga) Do Not substitute Alcohol to help with tapering If you have been on opioids for less than two weeks and do not have pain than it is ok to stop all together.  Plan to wean off of opioids This plan should start within one week post op of your joint replacement. Maintain the same interval or time between taking each dose and first decrease the dose.  Cut the total daily intake of opioids by one tablet each day Next start to increase the time between doses. The last dose that should be eliminated is the evening dose.   IF YOU ARE TRANSFERRED TO A SKILLED REHAB FACILITY If the patient is transferred to a skilled rehab facility following release from the hospital, a list of the current medications will be sent to the facility for the patient to continue.  When discharged from the skilled rehab facility, please have the facility set up the patient's Home Health Physical Therapy prior to being released. Also, the skilled facility will be responsible for providing the patient with their medications at time of release from the facility to include their pain medication, the muscle relaxants, and their blood thinner medication. If the patient is still at the rehab facility at time of the two week follow up appointment, the skilled rehab facility will also need to assist the patient in arranging follow up appointment in our office and any transportation needs.  MAKE SURE YOU:  Understand these instructions.  Get help right away if you are not doing well or get worse.   DENTAL ANTIBIOTICS:  In most cases prophylactic antibiotics for Dental procdeures after total joint surgery are  not necessary.  Exceptions are as follows:  1. History of prior total joint infection  2. Severely immunocompromised (Organ Transplant, cancer chemotherapy, Rheumatoid biologic meds such as Humera)  3. Poorly controlled diabetes (A1C &gt; 8.0, blood glucose over 200)  If you have one of these conditions, contact your surgeon for an antibiotic prescription, prior to your dental procedure.    Pick up stool softner and laxative for home use following surgery while on pain medications. Do not submerge incision under water. Please use good hand washing techniques while changing dressing each day. May shower starting three days after surgery. Please use a clean towel to pat the incision dry following showers. Continue to use ice for pain and swelling after surgery. Do not use any lotions or creams on the incision until instructed by your surgeon.

## 2023-08-31 NOTE — Progress Notes (Signed)
 Orthopedic Tech Progress Note Patient Details:  Olivia Walker [birth year] [id number]  Patient ID: [name], female   DOB: Jul 22, 1950, 73 y.o.   MRN: [id number] Unable to place CPM at this time due to patient being on a stretcher and not a hospital bed. Darleen Crocker 08/31/2023, 9:52 AM

## 2023-08-31 NOTE — Progress Notes (Signed)
 Orthopedic Tech Progress Note Patient Details:  Olivia Walker 1950-06-02 [id number]  Patient ID: [name], female   DOB: 05-23-1951, 73 y.o.   MRN: [id number] Patient currently working with therapy, will attempt to apply CPM again at a later time. Darleen Crocker 08/31/2023, 12:45 PM

## 2023-08-31 NOTE — Anesthesia Procedure Notes (Signed)
 Spinal  Patient location during procedure: OR Start time: 08/31/2023 8:28 AM End time: 08/31/2023 8:34 AM Reason for block: surgical anesthesia Staffing Performed: anesthesiologist  Anesthesiologist: Mariann Barter, MD Performed by: Mariann Barter, MD Authorized by: Mariann Barter, MD   Preanesthetic Checklist Completed: patient identified, IV checked, site marked, risks and benefits discussed, surgical consent, monitors and equipment checked, pre-op evaluation and timeout performed Spinal Block Patient position: sitting Prep: DuraPrep Patient monitoring: heart rate, cardiac monitor, continuous pulse ox and blood pressure Approach: midline Location: L3-4 Injection technique: single-shot Needle Needle type: Sprotte  Needle gauge: 22 G Needle length: 12.7 cm Assessment Sensory level: T4 Events: CSF return

## 2023-08-31 NOTE — Anesthesia Preprocedure Evaluation (Signed)
 Anesthesia Evaluation  Patient identified by MRN, date of birth, ID band Patient awake    Reviewed: Allergy & Precautions, NPO status , Patient's Chart, lab work & pertinent test results, reviewed documented beta blocker date and time   History of Anesthesia Complications (+) PONV and history of anesthetic complications  Airway Mallampati: III  TM Distance: >3 FB     Dental  (+) Edentulous Upper   Pulmonary shortness of breath and with exertion, sleep apnea and Continuous Positive Airway Pressure Ventilation , neg COPD   breath sounds clear to auscultation       Cardiovascular (-) CAD, (-) Past MI, (-) Cardiac Stents and (-) CABG  Rhythm:Regular Rate:Normal     Neuro/Psych neg Seizures PSYCHIATRIC DISORDERS Anxiety Depression       GI/Hepatic ,GERD  Medicated and Controlled,,(+) neg Cirrhosis        Endo/Other  diabetes, Type 2    Renal/GU CRFRenal disease     Musculoskeletal  (+) Arthritis ,    Abdominal   Peds  Hematology  (+) Blood dyscrasia, anemia   Anesthesia Other Findings   Reproductive/Obstetrics                             Anesthesia Physical Anesthesia Plan  ASA: 2  Anesthesia Plan: Spinal   Post-op Pain Management: Regional block*   Induction: Intravenous  PONV Risk Score and Plan: 2 and Ondansetron and Propofol infusion  Airway Management Planned: Natural Airway and Simple Face Mask  Additional Equipment:   Intra-op Plan:   Post-operative Plan:   Informed Consent: I have reviewed the patients History and Physical, chart, labs and discussed the procedure including the risks, benefits and alternatives for the proposed anesthesia with the patient or authorized representative who has indicated his/her understanding and acceptance.     Dental advisory given  Plan Discussed with: CRNA  Anesthesia Plan Comments:        Anesthesia Quick Evaluation

## 2023-08-31 NOTE — Progress Notes (Signed)
 Orthopedic Tech Progress Note Patient Details:  Olivia Walker 07-11-1950 [id number]  Patient ID: [name], female   DOB: January 09, 1951, 73 y.o.   MRN: [id number] CPM removed by 3W staff. Darleen Crocker 08/31/2023, 5:27 PM

## 2023-08-31 NOTE — Op Note (Signed)
 OPERATIVE REPORT-TOTAL KNEE ARTHROPLASTY   Pre-operative diagnosis- Osteoarthritis  Left knee(s)  Post-operative diagnosis- Osteoarthritis Left knee(s)  Procedure-  Left  Total Knee Arthroplasty  Surgeon- Gus Rankin. Guiselle Mian, MD  Assistant- Weston Brass, PA-C   Anesthesia-   Adductor canal block and spinal  EBL-15 mL   Drains None  Tourniquet time-  Total Tourniquet Time Documented: Thigh (Left) - 30 minutes Total: Thigh (Left) - 30 minutes     Complications- None  Condition-PACU - hemodynamically stable.   Brief Clinical Note  Olivia Walker is a 73 y.o. year old female with end stage OA of her left knee with progressively worsening pain and dysfunction. She has constant pain, with activity and at rest and significant functional deficits with difficulties even with ADLs. She has had extensive non-op management including analgesics, injections of cortisone and viscosupplements, and home exercise program, but remains in significant pain with significant dysfunction. Radiographs show bone on bone arthritis lateral and patellofemoral. She presents now for left Total Knee Arthroplasty.     Procedure in detail---   The patient is brought into the operating room and positioned supine on the operating table. After successful administration of  Adductor canal block and spinal,   a tourniquet is placed high on the  Left thigh(s) and the lower extremity is prepped and draped in the usual sterile fashion. Time out is performed by the operating team and then the  Left lower extremity is wrapped in Esmarch, knee flexed and the tourniquet inflated to 300 mmHg.       A midline incision is made with a ten blade through the subcutaneous tissue to the level of the extensor mechanism. A fresh blade is used to make a medial parapatellar arthrotomy. Soft tissue over the proximal medial tibia is subperiosteally elevated to the joint line with a knife and into the semimembranosus bursa with a Cobb  elevator. Soft tissue over the proximal lateral tibia is elevated with attention being paid to avoiding the patellar tendon on the tibial tubercle. The patella is everted, knee flexed 90 degrees and the ACL and PCL are removed. Findings are bone on bone medial and patellofemoral with large global osteophytes        The drill is used to create a starting hole in the distal femur and the canal is thoroughly irrigated with sterile saline to remove the fatty contents. The 5 degree Left  valgus alignment guide is placed into the femoral canal and the distal femoral cutting block is pinned to remove 10 mm off the distal femur. Resection is made with an oscillating saw.      The tibia is subluxed forward and the menisci are removed. The extramedullary alignment guide is placed referencing proximally at the medial aspect of the tibial tubercle and distally along the second metatarsal axis and tibial crest. The block is pinned to remove 2mm off the more deficient lateral  side. Resection is made with an oscillating saw. Size 5 is the most appropriate size for the tibia and the proximal tibia is prepared with the modular drill and keel punch for that size.      The femoral sizing guide is placed and size 5 is most appropriate. Rotation is marked off the epicondylar axis and confirmed by creating a rectangular flexion gap at 90 degrees. The size 5 cutting block is pinned in this rotation and the anterior, posterior and chamfer cuts are made with the oscillating saw. The intercondylar block is then placed and that cut  is made.      Trial size 5 tibial component, trial size 5 posterior stabilized femur and a 8  mm posterior stabilized rotating platform insert trial is placed. Full extension is achieved with excellent varus/valgus and anterior/posterior balance throughout full range of motion. The patella is everted and thickness measured to be 22  mm. Free hand resection is taken to 12 mm, a 38 template is placed, lug holes  are drilled, trial patella is placed, and it tracks normally. Osteophytes are removed off the posterior femur with the trial in place. All trials are removed and the cut bone surfaces prepared with pulsatile lavage. Cement is mixed and once ready for implantation, the size 5 tibial implant, size  5 posterior stabilized femoral component, and the size 38 patella are cemented in place and the patella is held with the clamp. The trial insert is placed and the knee held in full extension. The Exparel (20 ml mixed with 60 ml saline) is injected into the extensor mechanism, posterior capsule, medial and lateral gutters and subcutaneous tissues.  All extruded cement is removed and once the cement is hard the permanent 8 mm posterior stabilized rotating platform insert is placed into the tibial tray.      The wound is copiously irrigated with saline solution and the extensor mechanism closed with # 0 Stratofix suture. The tourniquet is released for a total tourniquet time of 30  minutes. Flexion against gravity is 140 degrees and the patella tracks normally. Subcutaneous tissue is closed with 2.0 vicryl and subcuticular with running 4.0 Monocryl. The incision is cleaned and dried and steri-strips and a bulky sterile dressing are applied. The limb is placed into a knee immobilizer and the patient is awakened and transported to recovery in stable condition.      Please note that a surgical assistant was a medical necessity for this procedure in order to perform it in a safe and expeditious manner. Surgical assistant was necessary to retract the ligaments and vital neurovascular structures to prevent injury to them and also necessary for proper positioning of the limb to allow for anatomic placement of the prosthesis.   Gus Rankin Marshal Schrecengost, MD    08/31/2023, 9:27 AM

## 2023-08-31 NOTE — Anesthesia Procedure Notes (Signed)
 Anesthesia Regional Block: Adductor canal block   Pre-Anesthetic Checklist: , timeout performed,  Correct Patient, Correct Site, Correct Laterality,  Correct Procedure, Correct Position, site marked,  Risks and benefits discussed,  Surgical consent,  Pre-op evaluation,  At surgeon's request and post-op pain management  Laterality: Left  Prep: Maximum Sterile Barrier Precautions used, chloraprep       Needles:  Injection technique: Single-shot  Needle Type: Echogenic Needle      Needle Gauge: 20     Additional Needles:   Procedures:,,,, ultrasound used (permanent image in chart),,    Narrative:  Start time: 08/31/2023 8:07 AM End time: 08/31/2023 8:09 AM Injection made incrementally with aspirations every 5 mL.  Performed by: Personally  Anesthesiologist: Mariann Barter, MD

## 2023-09-01 ENCOUNTER — Encounter (HOSPITAL_COMMUNITY): Payer: Self-pay | Admitting: Orthopedic Surgery

## 2023-09-01 DIAGNOSIS — M1712 Unilateral primary osteoarthritis, left knee: Secondary | ICD-10-CM | POA: Diagnosis not present

## 2023-09-01 LAB — GLUCOSE, CAPILLARY
Glucose-Capillary: 160 mg/dL — ABNORMAL HIGH (ref 70–99)
Glucose-Capillary: 170 mg/dL — ABNORMAL HIGH (ref 70–99)
Glucose-Capillary: 121 mg/dL — ABNORMAL HIGH (ref 70–99)

## 2023-09-01 LAB — BASIC METABOLIC PANEL WITH GFR
Anion gap: 7 (ref 5–15)
BUN: 8 mg/dL (ref 8–23)
CO2: 25 mmol/L (ref 22–32)
Calcium: 8.2 mg/dL — ABNORMAL LOW (ref 8.9–10.3)
Chloride: 105 mmol/L (ref 98–111)
Creatinine, Ser: 0.88 mg/dL (ref 0.44–1.00)
GFR, Estimated: >60 mL/min (ref >60)
Glucose, Bld: 220 mg/dL — ABNORMAL HIGH (ref 70–99)
Potassium: 4.6 mmol/L (ref 3.5–5.1)
Sodium: 137 mmol/L (ref 135–145)

## 2023-09-01 LAB — CBC
HCT: 31 % — ABNORMAL LOW (ref 36.0–46.0)
Hemoglobin: 9.2 g/dL — ABNORMAL LOW (ref 12.0–15.0)
MCH: 25.6 pg — ABNORMAL LOW (ref 26.0–34.0)
MCHC: 29.7 g/dL — ABNORMAL LOW (ref 30.0–36.0)
MCV: 86.1 fL (ref 80.0–100.0)
Platelets: 214 10*3/uL (ref 150–400)
RBC: 3.6 MIL/uL — ABNORMAL LOW (ref 3.87–5.11)
RDW: 16.6 % — ABNORMAL HIGH (ref 11.5–15.5)
WBC: 10.5 10*3/uL (ref 4.0–10.5)
nRBC: 0 % (ref 0.0–0.2)

## 2023-09-01 MED ORDER — ONDANSETRON HCL 4 MG PO TABS: 4.0000 mg | ORAL_TABLET | Freq: Four times a day (QID) | ORAL | 0 refills | Status: AC | PRN

## 2023-09-01 MED ORDER — ASPIRIN 81 MG PO CHEW: 81.0000 mg | CHEWABLE_TABLET | Freq: Two times a day (BID) | ORAL | 0 refills | Status: AC

## 2023-09-01 MED ORDER — METHOCARBAMOL 500 MG PO TABS: 500.0000 mg | ORAL_TABLET | Freq: Four times a day (QID) | ORAL | 0 refills | Status: AC | PRN

## 2023-09-01 MED ORDER — TRAMADOL HCL 50 MG PO TABS: 50.0000 mg | ORAL_TABLET | Freq: Four times a day (QID) | ORAL | 0 refills | Status: AC | PRN

## 2023-09-01 MED ORDER — OXYCODONE HCL 5 MG PO TABS: 5.0000 mg | ORAL_TABLET | Freq: Four times a day (QID) | ORAL | 0 refills | Status: DC | PRN

## 2023-09-01 NOTE — Care Management Obs Status (Signed)
 MEDICARE OBSERVATION STATUS NOTIFICATION   Patient Details  Name: Olivia Walker MRN: [id number] Date of Birth: 1951-01-14   Medicare Observation Status Notification Given:  Yes    Howell Rucks, RN 09/01/2023, 10:09 AM

## 2023-09-01 NOTE — Progress Notes (Signed)
 Physical Therapy Treatment Patient Details Name: Olivia Walker MRN: [id number] DOB: [birth year] Today's Date: 09/01/2023   History of Present Illness Pt is 73 yo female s/p L TKA on 08/31/23.  Pt with hx including but not limited to R TKA, OA, GERD, R THA    PT Comments  Pt with increased pain this afternoon (was premedicated) and not able to ambulate as far.  Also, she did not feel ready to attempt steps and has 3 non-sequential platform type steps to enter.  Pt will benefit from further therapy prior to return home.  Will plan to see pt tomorrow for PT.     If plan is discharge home, recommend the following: A little help with walking and/or transfers;A little help with bathing/dressing/bathroom;Help with stairs or ramp for entrance   Can travel by private vehicle        Equipment Recommendations  None recommended by PT    Recommendations for Other Services       Precautions / Restrictions Precautions Precautions: Fall;Knee Required Braces or Orthoses: Knee Immobilizer - Left Knee Immobilizer - Left: Discontinue once straight leg raise with < 10 degree lag Restrictions LLE Weight Bearing Per Provider Order: Weight bearing as tolerated     Mobility  Bed Mobility Overal bed mobility: Needs Assistance Bed Mobility: Supine to Sit     Supine to sit: Supervision Sit to supine: Supervision        Transfers Overall transfer level: Needs assistance Equipment used: Rolling walker (2 wheels) Transfers: Sit to/from Stand Sit to Stand: Min assist           General transfer comment: Pt with increased pain, required light min A and increased time to rise    Ambulation/Gait Ambulation/Gait assistance: Contact guard assist Gait Distance (Feet): 40 Feet Assistive device: Rolling walker (2 wheels) Gait Pattern/deviations: Step-to pattern, Decreased weight shift to left Gait velocity: decreased     General Gait Details: antalgic, decreased speed, limited distance due to  pain   Stairs Stairs:  (declined due to pain)           Wheelchair Mobility     Tilt Bed    Modified Rankin (Stroke Patients Only)       Balance Overall balance assessment: Needs assistance Sitting-balance support: No upper extremity supported Sitting balance-Leahy Scale: Good     Standing balance support: Bilateral upper extremity supported, No upper extremity supported Standing balance-Leahy Scale: Fair Standing balance comment: RW to ambulate; could stand at sink to brush teeth without support                            Communication    Cognition Arousal: Alert Behavior During Therapy: WFL for tasks assessed/performed   PT - Cognitive impairments: No apparent impairments                                Cueing    Exercises Total Joint Exercises Ankle Circles/Pumps: AROM, Both, 10 reps, Supine Quad Sets: AROM, Both, 10 reps, Supine Heel Slides: AAROM, Left, 5 reps, Seated Hip ABduction/ADduction: AAROM, Left, 5 reps, Seated Long Arc Quad: AROM, Left, 5 reps, Seated Knee Flexion: AROM, Left, 5 reps, Seated Goniometric ROM: L knee ~0 to 80 degrees Other Exercises Other Exercises: educated on AAROM technique as needed; limited reps and ROM due to pain    General Comments  Pertinent Vitals/Pain Pain Assessment Pain Assessment: 0-10 Pain Score: 8  Pain Location: L knee Pain Descriptors / Indicators: Discomfort, Burning Pain Intervention(s): Limited activity within patient's tolerance, Monitored during session, Premedicated before session, Repositioned, Ice applied    Home Living                          Prior Function            PT Goals (current goals can now be found in the care plan section) Progress towards PT goals: Progressing toward goals    Frequency    7X/week      PT Plan      Co-evaluation              AM-PAC PT "6 Clicks" Mobility   Outcome Measure  Help needed turning from  your back to your side while in a flat bed without using bedrails?: A Little Help needed moving from lying on your back to sitting on the side of a flat bed without using bedrails?: A Little Help needed moving to and from a bed to a chair (including a wheelchair)?: A Little Help needed standing up from a chair using your arms (e.g., wheelchair or bedside chair)?: A Little Help needed to walk in hospital room?: A Little Help needed climbing 3-5 steps with a railing? : A Little 6 Click Score: 18    End of Session Equipment Utilized During Treatment: Gait belt Activity Tolerance: Patient tolerated treatment well Patient left: with call bell/phone within reach;in bed;with bed alarm set Nurse Communication: Mobility status PT Visit Diagnosis: Other abnormalities of gait and mobility (R26.89);Muscle weakness (generalized) (M62.81)     Time: 4782-9562 PT Time Calculation (min) (ACUTE ONLY): 34 min  Charges:    $Gait Training: 8-22 mins $Therapeutic Exercise: 8-22 mins PT General Charges $$ ACUTE PT VISIT: 1 Visit                     Anise Salvo, PT Acute Rehab [facility] Re[facility] [phone]    Rayetta Humphrey 09/01/2023, 2:33 PM

## 2023-09-01 NOTE — Plan of Care (Signed)
  Problem: Coping: Goal: Level of anxiety will decrease Outcome: Progressing   Problem: Safety: Goal: Ability to remain free from injury will improve Outcome: Progressing   Problem: Education: Goal: Knowledge of the prescribed therapeutic regimen will improve Outcome: Progressing   Problem: Activity: Goal: Range of joint motion will improve Outcome: Progressing   Problem: Clinical Measurements: Goal: Postoperative complications will be avoided or minimized Outcome: Progressing   Problem: Pain Management: Goal: Pain level will decrease with appropriate interventions Outcome: Progressing

## 2023-09-01 NOTE — TOC Transition Note (Signed)
 Transition of Care [facility]) - Discharge Note   Patient Details  Name: Olivia Walker MRN: [id number] Date of Birth: April 05, 1951  Transition of Care [facility]) CM/SW Contact:  Howell Rucks, RN Phone Number: 09/01/2023, 11:49 AM   Clinical Narrative:   Met with pt at bedside to review dc therapy and home equipment needs, pt confirmed OPPT-EO, has RW, no home DME needs. No TOC needs.     Final next level of care: OP Rehab Barriers to Discharge: No Barriers Identified   Patient Goals and CMS Choice Patient states their goals for this hospitalization and ongoing recovery are:: return home          Discharge Placement                       Discharge Plan and Services Additional resources added to the After Visit Summary for                                       Social Drivers of Health (SDOH) Interventions SDOH Screenings   Food Insecurity: No Food Insecurity (08/31/2023)  Housing: Low Risk  (08/31/2023)  Transportation Needs: No Transportation Needs (08/31/2023)  Utilities: Not At Risk (08/31/2023)  Social Connections: Patient Declined (08/31/2023)  Tobacco Use: Low Risk  (08/31/2023)     Readmission Risk Interventions     No data to display

## 2023-09-01 NOTE — Progress Notes (Signed)
 Physical Therapy Treatment Patient Details Name: Olivia Walker MRN: [id number] DOB: [birth year] Today's Date: 09/01/2023   History of Present Illness Pt is 73 yo female s/p L TKA on 08/31/23.  Pt with hx including but not limited to R TKA, OA, GERD, R THA    PT Comments  Pt making good progress today.  She was able to ambulate 15' with RW and CGA. Pt with good balance and improved sensation upon standing today.  Will progress exercises and stair training this afternoon, but expect pt to progress to be able to return home.      If plan is discharge home, recommend the following: A little help with walking and/or transfers;A little help with bathing/dressing/bathroom;Help with stairs or ramp for entrance   Can travel by private vehicle        Equipment Recommendations  None recommended by PT    Recommendations for Other Services       Precautions / Restrictions Precautions Precautions: Fall;Knee Required Braces or Orthoses: Knee Immobilizer - Left Knee Immobilizer - Left: Discontinue once straight leg raise with < 10 degree lag Restrictions LLE Weight Bearing Per Provider Order: Weight bearing as tolerated     Mobility  Bed Mobility Overal bed mobility: Needs Assistance Bed Mobility: Supine to Sit     Supine to sit: Supervision          Transfers Overall transfer level: Needs assistance Equipment used: Rolling walker (2 wheels) Transfers: Sit to/from Stand Sit to Stand: Contact guard assist           General transfer comment: CGA for safety    Ambulation/Gait Ambulation/Gait assistance: Contact guard assist Gait Distance (Feet): 60 Feet Assistive device: Rolling walker (2 wheels) Gait Pattern/deviations: Step-to pattern, Decreased weight shift to left Gait velocity: decreased     General Gait Details: Cues for RW proximity; decreased speed but tolerated well   Stairs             Wheelchair Mobility     Tilt Bed    Modified Rankin (Stroke  Patients Only)       Balance Overall balance assessment: Needs assistance Sitting-balance support: No upper extremity supported Sitting balance-Leahy Scale: Good     Standing balance support: Bilateral upper extremity supported, No upper extremity supported Standing balance-Leahy Scale: Fair Standing balance comment: RW to ambulate; could stand at sink to brush teeth without support                            Communication    Cognition Arousal: Alert Behavior During Therapy: WFL for tasks assessed/performed   PT - Cognitive impairments: No apparent impairments                                Cueing    Exercises Total Joint Exercises Ankle Circles/Pumps: AROM, Both, 10 reps, Supine Quad Sets: AROM, Both, 10 reps, Supine Heel Slides: AAROM, Left, 5 reps, Seated Hip ABduction/ADduction: AAROM, Left, 5 reps, Seated Long Arc Quad: AROM, Left, 5 reps, Seated Knee Flexion: AROM, Left, 5 reps, Seated Goniometric ROM: L knee ~0 to 80 degrees Other Exercises Other Exercises: educated on AAROM technique as needed    General Comments        Pertinent Vitals/Pain Pain Assessment Pain Assessment: 0-10 Pain Score: 4  Pain Location: L knee Pain Descriptors / Indicators: Discomfort Pain Intervention(s): Limited activity within patient's tolerance,  Premedicated before session, Monitored during session, Repositioned, Ice applied    Home Living                          Prior Function            PT Goals (current goals can now be found in the care plan section) Progress towards PT goals: Progressing toward goals    Frequency    7X/week      PT Plan      Co-evaluation              AM-PAC PT "6 Clicks" Mobility   Outcome Measure  Help needed turning from your back to your side while in a flat bed without using bedrails?: A Little Help needed moving from lying on your back to sitting on the side of a flat bed without using  bedrails?: A Little Help needed moving to and from a bed to a chair (including a wheelchair)?: A Little Help needed standing up from a chair using your arms (e.g., wheelchair or bedside chair)?: A Little Help needed to walk in hospital room?: A Little Help needed climbing 3-5 steps with a railing? : A Little 6 Click Score: 18    End of Session Equipment Utilized During Treatment: Gait belt Activity Tolerance: Patient tolerated treatment well Patient left: with call bell/phone within reach;in chair   PT Visit Diagnosis: Other abnormalities of gait and mobility (R26.89);Muscle weakness (generalized) (M62.81)     Time: 8119-1478 PT Time Calculation (min) (ACUTE ONLY): 32 min  Charges:    $Gait Training: 8-22 mins $Therapeutic Exercise: 8-22 mins PT General Charges $$ ACUTE PT VISIT: 1 Visit                     Anise Salvo, PT Acute Rehab [facility] Re[facility] [phone]    Rayetta Humphrey 09/01/2023, 11:33 AM

## 2023-09-01 NOTE — Progress Notes (Signed)
 Subjective: 1 Day Post-Op Procedure(s) (LRB): ARTHROPLASTY, KNEE, TOTAL (Left) Patient reports pain as mild.   Patient seen in rounds by Dr. Lequita Halt. Patient is well, and has had no acute complaints or problems No issues overnight. Denies chest pain, SOB, or calf pain. Foley catheter removed this AM. We will continue therapy today, ambulated 4' yesterday.   Objective: Vital signs in last 24 hours: Temp:  [97.5 F (36.4 C)-98.7 F (37.1 C)] 98.2 F (36.8 C) (04/08 0628) Pulse Rate:  [54-97] 54 (04/08 0628) Resp:  [13-20] 19 (04/08 0628) BP: (101-155)/(55-94) 132/66 (04/08 0628) SpO2:  [92 %-99 %] 98 % (04/08 0628)  Intake/Output from previous day:  Intake/Output Summary (Last 24 hours) at 09/01/2023 0832 Last data filed at 09/01/2023 1610 Gross per 24 hour  Intake 3058.92 ml  Output 2140 ml  Net 918.92 ml     Intake/Output this shift: No intake/output data recorded.  Labs: Recent Labs    09/01/23 0335  HGB 9.2*   Recent Labs    09/01/23 0335  WBC 10.5  RBC 3.60*  HCT 31.0*  PLT 214   Recent Labs    09/01/23 0335  NA 137  K 4.6  CL 105  CO2 25  BUN 8  CREATININE 0.88  GLUCOSE 220*  CALCIUM 8.2*   No results for input(s): "LABPT", "INR" in the last 72 hours.  Exam: General - Patient is Alert and Oriented Extremity - Neurologically intact Neurovascular intact Sensation intact distally Dorsiflexion/Plantar flexion intact Dressing - dressing C/D/I Motor Function - intact, moving foot and toes well on exam.   Past Medical History:  Diagnosis Date   Anemia    on and off   Ankle swelling    Anxiety    Arthritis    Chronic pain    knees, hips   Depression with anxiety 10/08/2012    Treated since 1995 with Prozac, disability.    Diabetes mellitus without complication (HCC)    prediabetic   Dyspnea    GERD (gastroesophageal reflux disease)    Hemorrhoids    mild   High cholesterol    History of kidney stones    Impaired glucose tolerance     OA (osteoarthritis)    hips, knees   Obesity    PONV (postoperative nausea and vomiting)    Sleep apnea    Sleep disorder breathing 06/17/2022   Stones in the urinary tract     Assessment/Plan: 1 Day Post-Op Procedure(s) (LRB): ARTHROPLASTY, KNEE, TOTAL (Left) Principal Problem:   OA (osteoarthritis) of knee Active Problems:   Primary osteoarthritis of left knee  Estimated body mass index is 37.42 kg/m as calculated from the following:   Height as of this encounter: 5\' 5"  (1.651 m).   Weight as of this encounter: 102 kg. Advance diet Up with therapy D/C IV fluids   Patient's anticipated LOS is less than 2 midnights, meeting these requirements: - Lives within 1 hour of care - Has a competent adult at home to recover with post-op - NO history of             - Chronic pain requiring opiods             - Coronary Artery Disease             - Heart failure             - Heart attack             - Stroke             -  DVT/VTE             - Cardiac arrhythmia             - Respiratory Failure/COPD             - Renal failure             - Anemia             - Advanced Liver disease   DVT Prophylaxis - Aspirin Weight bearing as tolerated. Continue therapy.  Plan is to go Home after hospital stay. Plan for discharge later today if progresses with therapy and meeting goals. Scheduled for OPPT at [facility]. Follow-up in the office in 2 weeks.  The PDMP database was reviewed today prior to any opioid medications being prescribed to this patient.  Arther Abbott, PA-C Orthopedic Surgery [phone] 09/01/2023, 8:32 AM

## 2023-09-02 DIAGNOSIS — M1712 Unilateral primary osteoarthritis, left knee: Secondary | ICD-10-CM | POA: Diagnosis not present

## 2023-09-02 LAB — CBC
HCT: 32.2 % — ABNORMAL LOW (ref 36.0–46.0)
Hemoglobin: 9.7 g/dL — ABNORMAL LOW (ref 12.0–15.0)
MCH: 25.7 pg — ABNORMAL LOW (ref 26.0–34.0)
MCHC: 30.1 g/dL (ref 30.0–36.0)
MCV: 85.2 fL (ref 80.0–100.0)
Platelets: 221 10*3/uL (ref 150–400)
RBC: 3.78 MIL/uL — ABNORMAL LOW (ref 3.87–5.11)
RDW: 17 % — ABNORMAL HIGH (ref 11.5–15.5)
WBC: 10 10*3/uL (ref 4.0–10.5)
nRBC: 0 % (ref 0.0–0.2)

## 2023-09-02 LAB — GLUCOSE, CAPILLARY
Glucose-Capillary: 115 mg/dL — ABNORMAL HIGH (ref 70–99)
Glucose-Capillary: 124 mg/dL — ABNORMAL HIGH (ref 70–99)
Glucose-Capillary: 167 mg/dL — ABNORMAL HIGH (ref 70–99)
Glucose-Capillary: 157 mg/dL — ABNORMAL HIGH (ref 70–99)
Glucose-Capillary: 117 mg/dL — ABNORMAL HIGH (ref 70–99)

## 2023-09-02 MED ORDER — METHYLPREDNISOLONE 4 MG PO TBPK
8.0000 mg | ORAL_TABLET | Freq: Every morning | ORAL | Status: AC
  Administered 2023-09-02: 8 mg via ORAL
  Filled 2023-09-02: qty 21

## 2023-09-02 MED ORDER — METHYLPREDNISOLONE 4 MG PO TBPK: 8.0000 mg | ORAL_TABLET | Freq: Every evening | ORAL | Status: DC

## 2023-09-02 MED ORDER — METHYLPREDNISOLONE 4 MG PO TBPK
4.0000 mg | ORAL_TABLET | Freq: Four times a day (QID) | ORAL | Status: DC
Start: 2023-09-04 — End: 2023-09-08

## 2023-09-02 MED ORDER — METHYLPREDNISOLONE 4 MG PO TBPK
4.0000 mg | ORAL_TABLET | ORAL | Status: AC
  Administered 2023-09-02: 4 mg via ORAL

## 2023-09-02 MED ORDER — METHYLPREDNISOLONE 4 MG PO TBPK
4.0000 mg | ORAL_TABLET | Freq: Three times a day (TID) | ORAL | Status: DC
  Administered 2023-09-03 (×2): 4 mg via ORAL

## 2023-09-02 MED ORDER — METHYLPREDNISOLONE 4 MG PO TBPK
8.0000 mg | ORAL_TABLET | Freq: Every evening | ORAL | Status: AC
  Administered 2023-09-02: 8 mg via ORAL

## 2023-09-02 NOTE — Progress Notes (Signed)
 Orthopedic Tech Progress Note Patient Details:  Olivia Walker Jul 30, 1950 [id number]  Patient ID: Olivia Walker, female   DOB: 08/26/1950, 73 y.o.   MRN: [id number]  [name] 09/02/2023, 10:56 AM Pt unable to tolerate cpm due to sciatica pain. Will check back later today

## 2023-09-02 NOTE — Progress Notes (Signed)
 PT Cancellation Note  Patient Details Name: Olivia Walker MRN: [id number] DOB: 11/15/1950   Cancelled Treatment:    Reason Eval/Treat Not Completed: Other (comment)  Checked at 1534 and pt reports too much pain, just got cleaned up. Checked back 1612 and pt in severe pain, shaking, and RN giving morphine.  Not able to tolerate therapy. Will f/u tomorrow.  Anise Salvo, PT Acute Rehab [facility] Rehab [phone]  Rayetta Humphrey 09/02/2023, 4:11 PM

## 2023-09-02 NOTE — Plan of Care (Signed)
   Problem: Safety: Goal: Ability to remain free from injury will improve Outcome: Progressing   Problem: Education: Goal: Knowledge of the prescribed therapeutic regimen will improve Outcome: Progressing   Problem: Activity: Goal: Range of joint motion will improve Outcome: Progressing   Problem: Pain Management: Goal: Pain level will decrease with appropriate interventions Outcome: Progressing

## 2023-09-02 NOTE — Progress Notes (Signed)
   Subjective: 2 Days Post-Op Procedure(s) (LRB): ARTHROPLASTY, KNEE, TOTAL (Left) Patient seen in rounds by Dr. Lequita Halt. Patient states that she is having significant sciatica-type pain. Knee pain is moderate. Denies calf pain.  Objective: Vital signs in last 24 hours: Temp:  [97.7 F (36.5 C)-99 F (37.2 C)] 98.7 F (37.1 C) (04/09 0507) Pulse Rate:  [77-79] 79 (04/09 0507) Resp:  [16] 16 (04/09 0507) BP: (118-153)/(65-81) 153/72 (04/09 0507) SpO2:  [95 %-99 %] 95 % (04/09 0507)  Intake/Output from previous day:  Intake/Output Summary (Last 24 hours) at 09/02/2023 0757 Last data filed at 09/02/2023 0600 Gross per 24 hour  Intake 1080 ml  Output 200 ml  Net 880 ml    Intake/Output this shift: No intake/output data recorded.  Labs: Recent Labs    09/01/23 0335 09/02/23 0328  HGB 9.2* 9.7*   Recent Labs    09/01/23 0335 09/02/23 0328  WBC 10.5 10.0  RBC 3.60* 3.78*  HCT 31.0* 32.2*  PLT 214 221   Recent Labs    09/01/23 0335  NA 137  K 4.6  CL 105  CO2 25  BUN 8  CREATININE 0.88  GLUCOSE 220*  CALCIUM 8.2*   No results for input(s): "LABPT", "INR" in the last 72 hours.  Exam: General - Patient is Alert and Confused Extremity - Neurologically intact Neurovascular intact Sensation intact distally Dorsiflexion/Plantar flexion intact Dressing/Incision - clean, dry, no drainage Motor Function - intact, moving foot and toes well on exam.  Past Medical History:  Diagnosis Date   Anemia    on and off   Ankle swelling    Anxiety    Arthritis    Chronic pain    knees, hips   Depression with anxiety 10/08/2012    Treated since 1995 with Prozac, disability.    Diabetes mellitus without complication (HCC)    prediabetic   Dyspnea    GERD (gastroesophageal reflux disease)    Hemorrhoids    mild   High cholesterol    History of kidney stones    Impaired glucose tolerance    OA (osteoarthritis)    hips, knees   Obesity    PONV (postoperative  nausea and vomiting)    Sleep apnea    Sleep disorder breathing 06/17/2022   Stones in the urinary tract     Assessment/Plan: 2 Days Post-Op Procedure(s) (LRB): ARTHROPLASTY, KNEE, TOTAL (Left) Principal Problem:   OA (osteoarthritis) of knee Active Problems:   Primary osteoarthritis of left knee  Estimated body mass index is 37.42 kg/m as calculated from the following:   Height as of this encounter: 5\' 5"  (1.651 m).   Weight as of this encounter: 102 kg.  DVT Prophylaxis - Aspirin Weight-bearing as tolerated.  Patient is diabetic but has tolerated prednisone dosepaks in the past. Will start this for sciatica.  Continue physical therapy today. Will likely require additional night in hospital.  R. Arcola Jansky, PA-C Orthopedic Surgery 09/02/2023, 7:57 AM

## 2023-09-02 NOTE — Progress Notes (Signed)
 Physical Therapy Treatment Patient Details Name: Olivia Walker MRN: [id number] DOB: February 27, 1951 Today's Date: 09/02/2023   History of Present Illness Pt is 73 yo female s/p L TKA on 08/31/23.  Pt with hx including but not limited to R TKA, OA, GERD, R THA    PT Comments  Pt with severe increase in sciatic type pain last night required morphine at 6 am.  Pt still with increased pain and with delayed/decreased responses - she is aware and feels due to meds.  Pt with very low tolerance for any activity or exercises .  Attempted gentle exercises , multiple methods of assist, and repositioning but pt could not tolerate.  Will f/u in afternoon.     If plan is discharge home, recommend the following: A lot of help with walking and/or transfers;A lot of help with bathing/dressing/bathroom   Can travel by private vehicle        Equipment Recommendations  None recommended by PT    Recommendations for Other Services       Precautions / Restrictions Precautions Precautions: Fall;Knee Restrictions LLE Weight Bearing Per Provider Order: Weight bearing as tolerated     Mobility  Bed Mobility               General bed mobility comments: unable due to pain    Transfers                        Ambulation/Gait                   Stairs             Wheelchair Mobility     Tilt Bed    Modified Rankin (Stroke Patients Only)       Balance                                            Communication    Cognition Arousal: Alert Behavior During Therapy: Flat affect   PT - Cognitive impairments: Problem solving                       PT - Cognition Comments: Pt had increased pain last night and received morphine around 6am.  She reports feeling out of it.  Very flat affect, slow to respond, at times not answering questions or answers don't quite make since (ex: when asked if she wanted blanket stated "I'm burnt" instead of hot, no  blanket, etc. )  Pt aware of this and feels due to meds and internally distracted by pain        Cueing    Exercises Total Joint Exercises Ankle Circles/Pumps: AROM, Both, Supine (10x2, cues to continue ROM) Quad Sets: AROM, Both, Supine (10x2, cues to continue) Gluteal Sets: Limitations Gluteal Sets Limitations: tried 1 but increased sciatic pain Heel Slides: Limitations, AAROM, Right, 5 reps, Supine Heel Slides Limitations: Even AAROM on R side aggravating her pain - also little participation from pt.  Tried very gentle PROM on L side therapist assist (0-15 degrees) but pt reports unbearable.  Tried PROM on L side using bed features to flex knee but again reports unbearable. Hip ABduction/ADduction: AAROM, Left, 5 reps, Seated    General Comments General comments (skin integrity, edema, etc.): Pt reports increased sciatic pain.  States she has had in past.  Tried to obtain history but pt with limited and delayed answers (medication related).  Eventually able to determine she went to chiropractor in past for her pain and stated it helped but unable to state exercises, positions, heat/ice that helped in past. Today reports pain severe burning from buttock , posterior leg, lateral knee, to foot.  Reports "I just want them to knock me out." Tried gentle ROM and isometric exercises but pt with little participation or reports unbearable.  Not able to transfer due to pain.  Tried repositioning with legs elevated on pillow and even tried knees flexed on bed but reports all make worse.  Returned to supine, head elevated, legs flat.  Did encourage pt to begin any AROM "wiggling" that she could tolerate.  Pt allowed SCD placed to help prevent blood clots.      Pertinent Vitals/Pain Pain Assessment Pain Assessment: 0-10 Pain Score: 9  Pain Location: L lateral knee; additionally "sciatic" pain buttock, posterior leg down to heel Pain Descriptors / Indicators: Discomfort, Burning, Stabbing Pain  Intervention(s): Limited activity within patient's tolerance, Monitored during session, Premedicated before session, Repositioned, Ice applied, Utilized relaxation techniques    Home Living                          Prior Function            PT Goals (current goals can now be found in the care plan section) Progress towards PT goals: Not progressing toward goals - comment (limited by pain)    Frequency    7X/week      PT Plan      Co-evaluation              AM-PAC PT "6 Clicks" Mobility   Outcome Measure  Help needed turning from your back to your side while in a flat bed without using bedrails?: A Lot Help needed moving from lying on your back to sitting on the side of a flat bed without using bedrails?: A Lot Help needed moving to and from a bed to a chair (including a wheelchair)?: A Lot Help needed standing up from a chair using your arms (e.g., wheelchair or bedside chair)?: A Lot Help needed to walk in hospital room?: A Lot Help needed climbing 3-5 steps with a railing? : A Lot 6 Click Score: 12    End of Session   Activity Tolerance: Patient limited by pain Patient left: with call bell/phone within reach;in bed;with bed alarm set;with SCD's reapplied Nurse Communication: Mobility status PT Visit Diagnosis: Other abnormalities of gait and mobility (R26.89);Muscle weakness (generalized) (M62.81)     Time: 0981-1914 PT Time Calculation (min) (ACUTE ONLY): 19 min  Charges:    $Therapeutic Exercise: 8-22 mins PT General Charges $$ ACUTE PT VISIT: 1 Visit                     Anise Salvo, PT Acute Rehab [facility] [phone]    Rayetta Humphrey 09/02/2023, 10:38 AM

## 2023-09-02 NOTE — Plan of Care (Signed)

## 2023-09-03 DIAGNOSIS — M1712 Unilateral primary osteoarthritis, left knee: Secondary | ICD-10-CM | POA: Diagnosis not present

## 2023-09-03 LAB — GLUCOSE, CAPILLARY
Glucose-Capillary: 148 mg/dL — ABNORMAL HIGH (ref 70–99)
Glucose-Capillary: 152 mg/dL — ABNORMAL HIGH (ref 70–99)

## 2023-09-03 NOTE — Plan of Care (Signed)
  Problem: Clinical Measurements: Goal: Ability to maintain clinical measurements within normal limits will improve Outcome: Progressing   Problem: Elimination: Goal: Will not experience complications related to urinary retention Outcome: Progressing   Problem: Safety: Goal: Ability to remain free from injury will improve Outcome: Progressing   Problem: Pain Management: Goal: Pain level will decrease with appropriate interventions Outcome: Progressing

## 2023-09-03 NOTE — Plan of Care (Signed)
  Problem: Activity: Goal: Risk for activity intolerance will decrease Outcome: Progressing   Problem: Elimination: Goal: Will not experience complications related to urinary retention Outcome: Progressing   Problem: Pain Managment: Goal: General experience of comfort will improve and/or be controlled Outcome: Progressing

## 2023-09-03 NOTE — Plan of Care (Signed)
  Problem: Education: Goal: Knowledge of General Education information will improve Description: Including pain rating scale, medication(s)/side effects and non-pharmacologic comfort measures Outcome: Adequate for Discharge   Problem: Health Behavior/Discharge Planning: Goal: Ability to manage health-related needs will improve Outcome: Adequate for Discharge   Problem: Clinical Measurements: Goal: Ability to maintain clinical measurements within normal limits will improve Outcome: Adequate for Discharge Goal: Will remain free from infection Outcome: Adequate for Discharge Goal: Diagnostic test results will improve Outcome: Adequate for Discharge Goal: Respiratory complications will improve Outcome: Adequate for Discharge Goal: Cardiovascular complication will be avoided Outcome: Adequate for Discharge   Problem: Activity: Goal: Risk for activity intolerance will decrease 09/03/2023 1535 by Alice Rieger A, LPN Outcome: Adequate for Discharge 09/03/2023 1028 by Alice Rieger A, LPN Outcome: Progressing   Problem: Nutrition: Goal: Adequate nutrition will be maintained Outcome: Adequate for Discharge   Problem: Coping: Goal: Level of anxiety will decrease Outcome: Adequate for Discharge   Problem: Elimination: Goal: Will not experience complications related to bowel motility Outcome: Adequate for Discharge Goal: Will not experience complications related to urinary retention 09/03/2023 1535 by Delila Spence, LPN Outcome: Adequate for Discharge 09/03/2023 1028 by Alice Rieger A, LPN Outcome: Progressing   Problem: Pain Managment: Goal: General experience of comfort will improve and/or be controlled 09/03/2023 1535 by Alice Rieger A, LPN Outcome: Adequate for Discharge 09/03/2023 1028 by Alice Rieger A, LPN Outcome: Progressing   Problem: Safety: Goal: Ability to remain free from injury will improve Outcome: Adequate for Discharge   Problem: Skin  Integrity: Goal: Risk for impaired skin integrity will decrease Outcome: Adequate for Discharge   Problem: Education: Goal: Ability to describe self-care measures that may prevent or decrease complications (Diabetes Survival Skills Education) will improve Outcome: Adequate for Discharge Goal: Individualized Educational Video(s) Outcome: Adequate for Discharge   Problem: Coping: Goal: Ability to adjust to condition or change in health will improve Outcome: Adequate for Discharge   Problem: Fluid Volume: Goal: Ability to maintain a balanced intake and output will improve Outcome: Adequate for Discharge   Problem: Health Behavior/Discharge Planning: Goal: Ability to identify and utilize available resources and services will improve Outcome: Adequate for Discharge Goal: Ability to manage health-related needs will improve Outcome: Adequate for Discharge   Problem: Metabolic: Goal: Ability to maintain appropriate glucose levels will improve Outcome: Adequate for Discharge   Problem: Nutritional: Goal: Maintenance of adequate nutrition will improve Outcome: Adequate for Discharge Goal: Progress toward achieving an optimal weight will improve Outcome: Adequate for Discharge   Problem: Skin Integrity: Goal: Risk for impaired skin integrity will decrease Outcome: Adequate for Discharge   Problem: Tissue Perfusion: Goal: Adequacy of tissue perfusion will improve Outcome: Adequate for Discharge   Problem: Education: Goal: Knowledge of the prescribed therapeutic regimen will improve Outcome: Adequate for Discharge Goal: Individualized Educational Video(s) Outcome: Adequate for Discharge   Problem: Activity: Goal: Ability to avoid complications of mobility impairment will improve Outcome: Adequate for Discharge Goal: Range of joint motion will improve Outcome: Adequate for Discharge   Problem: Clinical Measurements: Goal: Postoperative complications will be avoided or  minimized Outcome: Adequate for Discharge   Problem: Pain Management: Goal: Pain level will decrease with appropriate interventions Outcome: Adequate for Discharge   Problem: Skin Integrity: Goal: Will show signs of wound healing Outcome: Adequate for Discharge

## 2023-09-03 NOTE — Progress Notes (Signed)
 Physical Therapy Treatment Patient Details Name: Olivia Walker MRN: [id number] DOB: Jun 02, 1950 Today's Date: 09/03/2023   History of Present Illness Pt is 73 yo female s/p L TKA on 08/31/23.  Pt with hx including but not limited to R TKA, OA, GERD, R THA    PT Comments  Pt progressing this am. Amb 45' with RW and CGA, incr time and effort. Will proceed with stair training this afternoon in preparation for d/c today. Pt states she was here "4 days last time"; however given that pt has a "good knee" on contralateral side that her recovery should progress at a faster pace than prior surgery.    If plan is discharge home, recommend the following: A lot of help with walking and/or transfers;A lot of help with bathing/dressing/bathroom   Can travel by private vehicle        Equipment Recommendations  None recommended by PT    Recommendations for Other Services       Precautions / Restrictions Precautions Precautions: Fall;Knee Recall of Precautions/Restrictions: Intact Required Braces or Orthoses: Knee Immobilizer - Left Knee Immobilizer - Left: Discontinue once straight leg raise with < 10 degree lag Restrictions Weight Bearing Restrictions Per Provider Order: No LLE Weight Bearing Per Provider Order: Weight bearing as tolerated     Mobility  Bed Mobility Overal bed mobility: Needs Assistance Bed Mobility: Supine to Sit     Supine to sit: Supervision     General bed mobility comments: incr time and effort, cues for technique    Transfers Overall transfer level: Needs assistance Equipment used: Rolling walker (2 wheels) Transfers: Sit to/from Stand Sit to Stand: Contact guard assist, Min assist           General transfer comment: cues for hand placement and self assist    Ambulation/Gait Ambulation/Gait assistance: Contact guard assist, Supervision Gait Distance (Feet): 22 Feet Assistive device: Rolling walker (2 wheels) Gait Pattern/deviations: Step-to pattern,  Decreased weight shift to left Gait velocity: decreased     General Gait Details: antalgic, decreased speed, limited distance due to pain   Stairs             Wheelchair Mobility     Tilt Bed    Modified Rankin (Stroke Patients Only)       Balance   Sitting-balance support: No upper extremity supported Sitting balance-Leahy Scale: Good     Standing balance support: During functional activity, Reliant on assistive device for balance Standing balance-Leahy Scale: Poor                              Communication Communication Communication: No apparent difficulties  Cognition Arousal: Alert Behavior During Therapy: Flat affect                           PT - Cognition Comments: slighlty delayed processing, incr time to complete tasks with cues. alert and oriented Following commands: Intact      Cueing Cueing Techniques: Verbal cues, Tactile cues  Exercises      General Comments        Pertinent Vitals/Pain Pain Assessment Pain Assessment: 0-10 Pain Score: 6  Pain Location: L lateral knee; additionally "sciatic" pain buttock, posterior leg down to heel Pain Descriptors / Indicators: Discomfort, Burning, Stabbing Pain Intervention(s): Limited activity within patient's tolerance, Monitored during session, Premedicated before session, Repositioned    Home Living  Prior Function            PT Goals (current goals can now be found in the care plan section) Acute Rehab PT Goals Patient Stated Goal: return home; hoping for easier recovery than R side - reports in hospital 4 days last time PT Goal Formulation: With patient/family Time For Goal Achievement: 09/14/23 Potential to Achieve Goals: Good Progress towards PT goals: Progressing toward goals    Frequency    7X/week      PT Plan      Co-evaluation              AM-PAC PT "6 Clicks" Mobility   Outcome Measure  Help needed  turning from your back to your side while in a flat bed without using bedrails?: A Little Help needed moving from lying on your back to sitting on the side of a flat bed without using bedrails?: A Little Help needed moving to and from a bed to a chair (including a wheelchair)?: A Little Help needed standing up from a chair using your arms (e.g., wheelchair or bedside chair)?: A Little Help needed to walk in hospital room?: A Lot Help needed climbing 3-5 steps with a railing? : A Lot 6 Click Score: 16    End of Session Equipment Utilized During Treatment: Gait belt Activity Tolerance: Patient limited by fatigue Patient left: with call bell/phone within reach;in bed;with bed alarm set   PT Visit Diagnosis: Other abnormalities of gait and mobility (R26.89);Muscle weakness (generalized) (M62.81)     Time: 1610-9604 PT Time Calculation (min) (ACUTE ONLY): 18 min  Charges:    $Gait Training: 8-22 mins PT General Charges $$ ACUTE PT VISIT: 1 Visit                     Carynn Felling, PT  Acute Rehab Dept ([facility]) [phone]  09/03/2023    [facility] 09/03/2023, 1:07 PM

## 2023-09-03 NOTE — Progress Notes (Signed)
   Subjective: 3 Days Post-Op Procedure(s) (LRB): ARTHROPLASTY, KNEE, TOTAL (Left) Patient reports pain is improved compared to yesterday Patient seen in rounds for Dr. Lequita Halt. Patient reports sciatica is improved today. Had pain requiring IV morphine twice yesterday. Started on dose pack yesterday as well, will continue with this today.  Objective: Vital signs in last 24 hours: Temp:  [97.7 F (36.5 C)-98.9 F (37.2 C)] 98.9 F (37.2 C) (04/10 0700) Pulse Rate:  [72-87] 87 (04/10 0700) Resp:  [17-18] 17 (04/10 0700) BP: (154-188)/(72-91) 156/72 (04/10 0700) SpO2:  [93 %-98 %] 96 % (04/10 0700)  Intake/Output from previous day:  Intake/Output Summary (Last 24 hours) at 09/03/2023 0807 Last data filed at 09/03/2023 1610 Gross per 24 hour  Intake 600 ml  Output 700 ml  Net -100 ml    Intake/Output this shift: No intake/output data recorded.  Labs: Recent Labs    09/01/23 0335 09/02/23 0328  HGB 9.2* 9.7*   Recent Labs    09/01/23 0335 09/02/23 0328  WBC 10.5 10.0  RBC 3.60* 3.78*  HCT 31.0* 32.2*  PLT 214 221   Recent Labs    09/01/23 0335  NA 137  K 4.6  CL 105  CO2 25  BUN 8  CREATININE 0.88  GLUCOSE 220*  CALCIUM 8.2*   No results for input(s): "LABPT", "INR" in the last 72 hours.  Exam: General - Patient is Alert and Oriented Extremity - Neurologically intact Neurovascular intact Sensation intact distally Dorsiflexion/Plantar flexion intact Dressing/Incision - clean, dry, no drainage Motor Function - intact, moving foot and toes well on exam.   Past Medical History:  Diagnosis Date   Anemia    on and off   Ankle swelling    Anxiety    Arthritis    Chronic pain    knees, hips   Depression with anxiety 10/08/2012    Treated since 1995 with Prozac, disability.    Diabetes mellitus without complication (HCC)    prediabetic   Dyspnea    GERD (gastroesophageal reflux disease)    Hemorrhoids    mild   High cholesterol    History of  kidney stones    Impaired glucose tolerance    OA (osteoarthritis)    hips, knees   Obesity    PONV (postoperative nausea and vomiting)    Sleep apnea    Sleep disorder breathing 06/17/2022   Stones in the urinary tract     Assessment/Plan: 3 Days Post-Op Procedure(s) (LRB): ARTHROPLASTY, KNEE, TOTAL (Left) Principal Problem:   OA (osteoarthritis) of knee Active Problems:   Primary osteoarthritis of left knee  Estimated body mass index is 37.42 kg/m as calculated from the following:   Height as of this encounter: 5\' 5"  (1.651 m).   Weight as of this encounter: 102 kg. Up with therapy  DVT Prophylaxis - Aspirin Weight-bearing as tolerated  Discharge to home today if meeting goals and pain controlled. IV narcotics discontinued, cannot go home if still relying on this.  Arther Abbott, PA-C Orthopedic Surgery [phone] 09/03/2023, 8:07 AM

## 2023-09-03 NOTE — Progress Notes (Signed)
 Physical Therapy Treatment Patient Details Name: Olivia Walker MRN: [id number] DOB: 1951/03/21 Today's Date: 09/03/2023   History of Present Illness Pt is 73 yo female s/p L TKA on 08/31/23.  Pt with hx including but not limited to R TKA, OA, GERD, R THA    PT Comments  Pt progressing this session; good stability during gait with RW;  pt able to amb short household distance, review HEP and ascend/descend single step using posterior technique. Handouts provided. Pt is ready to d/c from PT standpoint with family/friend assisting as needed. Reviewed with pt that pt typically will need light assistance at home initially for mobility/safety.    If plan is discharge home, recommend the following: A lot of help with walking and/or transfers;A lot of help with bathing/dressing/bathroom   Can travel by private vehicle        Equipment Recommendations  None recommended by PT    Recommendations for Other Services       Precautions / Restrictions Precautions Precautions: Fall;Knee Recall of Precautions/Restrictions: Intact Required Braces or Orthoses: Knee Immobilizer - Left Knee Immobilizer - Left: Discontinue once straight leg raise with < 10 degree lag Restrictions Weight Bearing Restrictions Per Provider Order: No LLE Weight Bearing Per Provider Order: Weight bearing as tolerated     Mobility  Bed Mobility Overal bed mobility: Needs Assistance Bed Mobility: Supine to Sit     Supine to sit: Supervision     General bed mobility comments: pt in recliner    Transfers Overall transfer level: Needs assistance Equipment used: Rolling walker (2 wheels) Transfers: Sit to/from Stand Sit to Stand: Contact guard assist           General transfer comment: cues for hand placement and self assist    Ambulation/Gait Ambulation/Gait assistance: Contact guard assist, Supervision Gait Distance (Feet): 17 Feet Assistive device: Rolling walker (2 wheels) Gait Pattern/deviations: Step-to  pattern, Decreased weight shift to left Gait velocity: decreased     General Gait Details: slow gait velocity but steady  with RW.  distance ltd by fatigue   Stairs Stairs: Yes Stairs assistance: Contact guard assist, Min assist Stair Management: No rails, Step to pattern, Backwards, With walker Number of Stairs: 1 General stair comments: cues for sequence and technique; assist to lift RW on and off step; provided and reviewed handout for stair technique; KI utilized for up/down steps to help with pain control. pt wtih good stability, no knee buckling.   Wheelchair Mobility     Tilt Bed    Modified Rankin (Stroke Patients Only)       Balance   Sitting-balance support: No upper extremity supported Sitting balance-Leahy Scale: Good     Standing balance support: During functional activity, Reliant on assistive device for balance Standing balance-Leahy Scale: Poor Standing balance comment: RW to ambulate.  per nursing pt able to stand on operated LE and perform her own pericare/powder after toileting                            Communication Communication Communication: No apparent difficulties  Cognition Arousal: Alert Behavior During Therapy: Flat affect                           PT - Cognition Comments: slighlty delayed processing, incr time to complete tasks with cues. alert and oriented Following commands: Intact      Cueing Cueing Techniques: Verbal cues, Tactile cues  Exercises Total Joint Exercises Ankle Circles/Pumps: AROM, Both, 10 reps Heel Slides: AAROM, Left, 5 reps    General Comments        Pertinent Vitals/Pain Pain Assessment Pain Assessment: Faces Pain Score: 6  Faces Pain Scale: Hurts little more Pain Location: L lateral knee; additionally "sciatic" pain buttock, posterior leg down to heel Pain Descriptors / Indicators: Discomfort, Burning, Sore Pain Intervention(s): Limited activity within patient's tolerance,  Monitored during session    Home Living                          Prior Function            PT Goals (current goals can now be found in the care plan section) Acute Rehab PT Goals Patient Stated Goal: return home; hoping for easier recovery than R side - reports in hospital 4 days last time PT Goal Formulation: With patient/family Time For Goal Achievement: 09/14/23 Potential to Achieve Goals: Good Progress towards PT goals: Progressing toward goals    Frequency    7X/week      PT Plan      Co-evaluation              AM-PAC PT "6 Clicks" Mobility   Outcome Measure  Help needed turning from your back to your side while in a flat bed without using bedrails?: A Little Help needed moving from lying on your back to sitting on the side of a flat bed without using bedrails?: A Little Help needed moving to and from a bed to a chair (including a wheelchair)?: A Little Help needed standing up from a chair using your arms (e.g., wheelchair or bedside chair)?: A Little Help needed to walk in hospital room?: A Little Help needed climbing 3-5 steps with a railing? : A Little 6 Click Score: 18    End of Session Equipment Utilized During Treatment: Gait belt Activity Tolerance: Patient tolerated treatment well Patient left: in chair;with chair alarm set   PT Visit Diagnosis: Other abnormalities of gait and mobility (R26.89);Muscle weakness (generalized) (M62.81)     Time: 9563-8756 PT Time Calculation (min) (ACUTE ONLY): 34 min  Charges:    $Gait Training: 23-37 mins PT General Charges $$ ACUTE PT VISIT: 1 Visit                     Bernadette Armijo, PT  Acute Rehab Dept ([facility]) [phone]  09/03/2023    [facility] 09/03/2023, 3:02 PM

## 2023-09-07 NOTE — Discharge Summary (Signed)
 Patient ID: Olivia Walker MRN: [id number] DOB/AGE: [birth year] 71 y.o.  Admit date: 08/31/2023 Discharge date: 09/03/2023  Admission Diagnoses:  Principal Problem:   OA (osteoarthritis) of knee Active Problems:   Primary osteoarthritis of left knee   Discharge Diagnoses:  Same  Past Medical History:  Diagnosis Date   Anemia    on and off   Ankle swelling    Anxiety    Arthritis    Chronic pain    knees, hips   Depression with anxiety 10/08/2012    Treated since 1995 with Prozac, disability.    Diabetes mellitus without complication (HCC)    prediabetic   Dyspnea    GERD (gastroesophageal reflux disease)    Hemorrhoids    mild   High cholesterol    History of kidney stones    Impaired glucose tolerance    OA (osteoarthritis)    hips, knees   Obesity    PONV (postoperative nausea and vomiting)    Sleep apnea    Sleep disorder breathing 06/17/2022   Stones in the urinary tract     Surgeries: Procedure(s): ARTHROPLASTY, KNEE, TOTAL on 08/31/2023   Consultants:   Discharged Condition: Improved  Hospital Course: Olivia Walker is an 73 y.o. female who was admitted 08/31/2023 for operative treatment ofOA (osteoarthritis) of knee. Patient has severe unremitting pain that affects sleep, daily activities, and work/hobbies. After pre-op clearance the patient was taken to the operating room on 08/31/2023 and underwent  Procedure(s): ARTHROPLASTY, KNEE, TOTAL.    Patient was given perioperative antibiotics:  Anti-infectives (From admission, onward)    Start     Dose/Rate Route Frequency Ordered Stop   08/31/23 1500  ceFAZolin (ANCEF) IVPB 2g/100 mL premix        2 g 200 mL/hr over 30 Minutes Intravenous Every 6 hours 08/31/23 1209 08/31/23 2130   08/31/23 0615  ceFAZolin (ANCEF) IVPB 2g/100 mL premix        2 g 200 mL/hr over 30 Minutes Intravenous On call to O.R. 08/31/23 8295 08/31/23 6213        Patient was given sequential compression devices, early ambulation, and  chemoprophylaxis to prevent DVT.  Patient benefited maximally from hospital stay and there were no complications.    Recent vital signs: No data found.   Recent laboratory studies: No results for input(s): "WBC", "HGB", "HCT", "PLT", "NA", "K", "CL", "CO2", "BUN", "CREATININE", "GLUCOSE", "INR", "CALCIUM" in the last 72 hours.  Invalid input(s): "PT", "2"   Discharge Medications:   Allergies as of 09/03/2023       Reactions   Nitrofurantoin Macrocrystal Nausea And Vomiting   Makes very sick   Penicillins    From when younger Tolerated Cephalosporin Date: 12/24/21. Tolerated Cephalosporin Date: 09/01/23.          Medication List     TAKE these medications    acetaminophen 650 MG CR tablet Commonly known as: TYLENOL Take 1,300 mg by mouth every 8 (eight) hours as needed for pain.   ALPRAZolam 0.5 MG tablet Commonly known as: XANAX Take 0.25 mg by mouth at bedtime.   aspirin 81 MG chewable tablet Chew 1 tablet (81 mg total) by mouth 2 (two) times daily for 21 days. Then take one 81 mg aspirin once a day for three weeks. Then discontinue aspirin.   cetirizine 10 MG tablet Commonly known as: ZYRTEC Take 10 mg by mouth daily.   cycloSPORINE 0.05 % ophthalmic emulsion Commonly known as: RESTASIS 1 drop 2 (two) times  daily.   FISH OIL PO Take 4,300 mg by mouth in the morning. 2150 mg each   FLUoxetine 20 MG tablet Commonly known as: PROZAC Take 40 mg by mouth at bedtime.   gabapentin 300 MG capsule Commonly known as: NEURONTIN Take a 300 mg capsule three times a day for two weeks following surgery.Then take a 300 mg capsule two times a day for two weeks. Then take a 300 mg capsule once a day for two weeks. Then discontinue. What changed:  how much to take how to take this when to take this additional instructions   glipiZIDE 10 MG 24 hr tablet Commonly known as: GLUCOTROL XL Take 10 mg by mouth daily with breakfast.   GLUCOSAMINE-CHONDROITIN PO Take 1  tablet by mouth daily.   metFORMIN 750 MG 24 hr tablet Commonly known as: GLUCOPHAGE-XR Take 750 mg by mouth 2 (two) times daily.   methocarbamol 500 MG tablet Commonly known as: ROBAXIN Take 1 tablet (500 mg total) by mouth every 6 (six) hours as needed for muscle spasms.   multivitamin with minerals tablet Take 1 tablet by mouth daily.   ondansetron 4 MG disintegrating tablet Commonly known as: ZOFRAN-ODT Take 4 mg by mouth every 8 (eight) hours as needed for nausea or vomiting.   ondansetron 4 MG tablet Commonly known as: ZOFRAN Take 1 tablet (4 mg total) by mouth every 6 (six) hours as needed for nausea.   OneTouch Verio test strip Generic drug: glucose blood Once daily testing E11.9   oxyCODONE 5 MG immediate release tablet Commonly known as: Oxy IR/ROXICODONE Take 1-2 tablets (5-10 mg total) by mouth every 6 (six) hours as needed for severe pain (pain score 7-10).   promethazine 25 MG suppository Commonly known as: PHENERGAN Place 25 mg rectally every 6 (six) hours as needed for nausea or vomiting.   tamsulosin 0.4 MG Caps capsule Commonly known as: Flomax Take 1 capsule (0.4 mg total) by mouth daily.   traMADol 50 MG tablet Commonly known as: ULTRAM Take 1-2 tablets (50-100 mg total) by mouth every 6 (six) hours as needed for moderate pain (pain score 4-6).   traZODone 50 MG tablet Commonly known as: DESYREL Take 150 mg by mouth at bedtime.   TURMERIC PO Take 1 tablet by mouth daily.               Discharge Care Instructions  (From admission, onward)           Start     Ordered   09/01/23 0000  Weight bearing as tolerated        09/01/23 0834   09/01/23 0000  Change dressing       Comments: You may remove the bulky bandage (ACE wrap and gauze) two days after surgery. You will have an adhesive waterproof bandage underneath. Leave this in place until your first follow-up appointment.   09/01/23 0834            Diagnostic Studies: No  results found.  Disposition: Discharge disposition: 01-Home or Self Care       Discharge Instructions     Call MD / Call 911   Complete by: As directed    If you experience chest pain or shortness of breath, CALL 911 and be transported to the hospital emergency room.  If you develope a fever above 101 F, pus (white drainage) or increased drainage or redness at the wound, or calf pain, call your surgeon's office.   Change dressing  Complete by: As directed    You may remove the bulky bandage (ACE wrap and gauze) two days after surgery. You will have an adhesive waterproof bandage underneath. Leave this in place until your first follow-up appointment.   Constipation Prevention   Complete by: As directed    Drink plenty of fluids.  Prune juice may be helpful.  You may use a stool softener, such as Colace (over the counter) 100 mg twice a day.  Use MiraLax (over the counter) for constipation as needed.   Diet - low sodium heart healthy   Complete by: As directed    Do not put a pillow under the knee. Place it under the heel.   Complete by: As directed    Driving restrictions   Complete by: As directed    No driving for two weeks   Post-operative opioid taper instructions:   Complete by: As directed    POST-OPERATIVE OPIOID TAPER INSTRUCTIONS: It is important to wean off of your opioid medication as soon as possible. If you do not need pain medication after your surgery it is ok to stop day one. Opioids include: Codeine, Hydrocodone(Norco, Vicodin), Oxycodone(Percocet, oxycontin) and hydromorphone amongst others.  Long term and even short term use of opiods can cause: Increased pain response Dependence Constipation Depression Respiratory depression And more.  Withdrawal symptoms can include Flu like symptoms Nausea, vomiting And more Techniques to manage these symptoms Hydrate well Eat regular healthy meals Stay active Use relaxation techniques(deep breathing, meditating,  yoga) Do Not substitute Alcohol to help with tapering If you have been on opioids for less than two weeks and do not have pain than it is ok to stop all together.  Plan to wean off of opioids This plan should start within one week post op of your joint replacement. Maintain the same interval or time between taking each dose and first decrease the dose.  Cut the total daily intake of opioids by one tablet each day Next start to increase the time between doses. The last dose that should be eliminated is the evening dose.      TED hose   Complete by: As directed    Use stockings (TED hose) for three weeks on both leg(s).  You may remove them at night for sleeping.   Weight bearing as tolerated   Complete by: As directed         Follow-up Information     Aluisio, Samuel Crock, MD Follow up in 2 week(s).   Specialty: Orthopedic Surgery Contact information: [address] [place] [address] [place] [address] [phone]                  Signed: Sharlynn Dear 09/07/2023, 9:50 AM

## 2023-09-23 ENCOUNTER — Telehealth: Payer: Self-pay | Admitting: Podiatry

## 2023-09-23 MED ORDER — DOXYCYCLINE HYCLATE 100 MG PO TABS: 100.0000 mg | ORAL_TABLET | Freq: Two times a day (BID) | ORAL | 0 refills | Status: AC

## 2023-09-23 NOTE — Telephone Encounter (Signed)
 Patient called she had a toe nail worked on but feels she needs an antibiotic it looks like its getting infected. Pt has an appt coming up but doesn't want to wait till then. Pt uses [facility]

## 2023-10-02 ENCOUNTER — Ambulatory Visit: Admitting: Podiatry

## 2023-10-02 DIAGNOSIS — L97521 Non-pressure chronic ulcer of other part of left foot limited to breakdown of skin: Secondary | ICD-10-CM

## 2023-10-02 NOTE — Progress Notes (Unsigned)
 Subjective:  Patient ID: Olivia Walker, female    DOB: Jan 19, 1951,  MRN: [id number]  Chief Complaint  Patient presents with   Nail dystrophy    Pt stated that she feels like it is doing better     73 y.o. female presents with the above complaint.  Patient presents with left hallux ulceration with limited to the breakdown of the skin secondary to history of total nail avulsion.  She states is looking better.  She states that it was a little bit tender and raw.  She wanted to discuss other dressing change options.  Not clinically infected.  No acute complaints no nausea fever chills vomiting.   Review of Systems: Negative except as noted in the HPI. Denies N/V/F/Ch.  Past Medical History:  Diagnosis Date   Anemia    on and off   Ankle swelling    Anxiety    Arthritis    Chronic pain    knees, hips   Depression with anxiety 10/08/2012    Treated since 1995 with Prozac , disability.    Diabetes mellitus without complication (HCC)    prediabetic   Dyspnea    GERD (gastroesophageal reflux disease)    Hemorrhoids    mild   High cholesterol    History of kidney stones    Impaired glucose tolerance    OA (osteoarthritis)    hips, knees   Obesity    PONV (postoperative nausea and vomiting)    Sleep apnea    Sleep disorder breathing 06/17/2022   Stones in the urinary tract     Current Outpatient Medications:    acetaminophen  (TYLENOL ) 650 MG CR tablet, Take 1,300 mg by mouth every 8 (eight) hours as needed for pain., Disp: , Rfl:    ALPRAZolam  (XANAX ) 0.5 MG tablet, Take 0.25 mg by mouth at bedtime., Disp: , Rfl:    cetirizine (ZYRTEC) 10 MG tablet, Take 10 mg by mouth daily., Disp: , Rfl:    cycloSPORINE  (RESTASIS ) 0.05 % ophthalmic emulsion, 1 drop 2 (two) times daily., Disp: , Rfl:    doxycycline  (VIBRA -TABS) 100 MG tablet, Take 1 tablet (100 mg total) by mouth 2 (two) times daily., Disp: 20 tablet, Rfl: 0   FLUoxetine  (PROZAC ) 20 MG tablet, Take 40 mg by mouth at bedtime.,  Disp: , Rfl:    gabapentin  (NEURONTIN ) 300 MG capsule, Take a 300 mg capsule three times a day for two weeks following surgery.Then take a 300 mg capsule two times a day for two weeks. Then take a 300 mg capsule once a day for two weeks. Then discontinue. (Patient taking differently: Take 100 mg by mouth 2 (two) times daily.), Disp: 84 capsule, Rfl: 0   glipiZIDE  (GLUCOTROL  XL) 10 MG 24 hr tablet, Take 10 mg by mouth daily with breakfast., Disp: , Rfl:    GLUCOSAMINE-CHONDROITIN PO, Take 1 tablet by mouth daily., Disp: , Rfl:    glucose blood (ONETOUCH VERIO) test strip, Once daily testing E11.9, Disp: , Rfl:    metFORMIN (GLUCOPHAGE-XR) 750 MG 24 hr tablet, Take 750 mg by mouth 2 (two) times daily., Disp: , Rfl:    methocarbamol  (ROBAXIN ) 500 MG tablet, Take 1 tablet (500 mg total) by mouth every 6 (six) hours as needed for muscle spasms., Disp: 40 tablet, Rfl: 0   Multiple Vitamins-Minerals (MULTIVITAMIN WITH MINERALS) tablet, Take 1 tablet by mouth daily., Disp: , Rfl:    Omega-3 Fatty Acids (FISH OIL PO), Take 4,300 mg by mouth in the morning. 2150 mg  each, Disp: , Rfl:    ondansetron  (ZOFRAN ) 4 MG tablet, Take 1 tablet (4 mg total) by mouth every 6 (six) hours as needed for nausea., Disp: 20 tablet, Rfl: 0   ondansetron  (ZOFRAN -ODT) 4 MG disintegrating tablet, Take 4 mg by mouth every 8 (eight) hours as needed for nausea or vomiting., Disp: , Rfl:    oxyCODONE  (OXY IR/ROXICODONE ) 5 MG immediate release tablet, Take 1-2 tablets (5-10 mg total) by mouth every 6 (six) hours as needed for severe pain (pain score 7-10)., Disp: 42 tablet, Rfl: 0   promethazine  (PHENERGAN ) 25 MG suppository, Place 25 mg rectally every 6 (six) hours as needed for nausea or vomiting., Disp: , Rfl:    tamsulosin  (FLOMAX ) 0.4 MG CAPS capsule, Take 1 capsule (0.4 mg total) by mouth daily. (Patient taking differently: Take 0.4 mg by mouth daily as needed (Kidney stones).), Disp: 30 capsule, Rfl: 0   traMADol  (ULTRAM ) 50 MG  tablet, Take 1-2 tablets (50-100 mg total) by mouth every 6 (six) hours as needed for moderate pain (pain score 4-6)., Disp: 40 tablet, Rfl: 0   traZODone  (DESYREL ) 50 MG tablet, Take 150 mg by mouth at bedtime., Disp: , Rfl:    TURMERIC PO, Take 1 tablet by mouth daily., Disp: , Rfl:   Social History   Tobacco Use  Smoking Status Never  Smokeless Tobacco Never    Allergies  Allergen Reactions   Nitrofurantoin  Macrocrystal Nausea And Vomiting    Makes very sick   Penicillins     From when younger  Tolerated Cephalosporin Date: 12/24/21. Tolerated Cephalosporin Date: 09/01/23.       Objective:  There were no vitals filed for this visit. There is no height or weight on file to calculate BMI. Constitutional Well developed. Well nourished.  Vascular Dorsalis pedis pulses palpable bilaterally. Posterior tibial pulses palpable bilaterally. Capillary refill normal to all digits.  No cyanosis or clubbing noted. Pedal hair growth normal.  Neurologic Normal speech. Oriented to person, place, and time. Epicritic sensation to light touch grossly present bilaterally.  Dermatologic Left hallux ulceration limited to the breakdown of the skin no clinical signs of infection noted no purulent drainage noted no cellulitis or redness noted.  No malodor present. Skin within normal limits  Orthopedic: Normal joint ROM without pain or crepitus bilaterally. No visible deformities. No bony tenderness.   Radiographs: None Assessment:   1. Chronic toe ulcer, left, limited to breakdown of skin [facility])    Plan:  Patient was evaluated and treated and all questions answered.  Left hallux ulceration limited to breakdown of skin with history of total nail avulsion - All questions and concerns were discussed with the patient in extensive detail - Patient will benefit from Betadine  wet-to-dry dressing he agrees with the plan she would like to undergo Betadine  wet-to-dry dressing.  Encouraged her to  start using Betadine  once a day.  She states understanding - Shoe gear modification discussed  No follow-ups on file.  Left hallux ulcer with history of total nail avulsion iodine  dressings

## 2023-10-27 ENCOUNTER — Other Ambulatory Visit: Payer: Self-pay

## 2023-10-27 ENCOUNTER — Emergency Department (HOSPITAL_BASED_OUTPATIENT_CLINIC_OR_DEPARTMENT_OTHER)

## 2023-10-27 ENCOUNTER — Encounter (HOSPITAL_BASED_OUTPATIENT_CLINIC_OR_DEPARTMENT_OTHER): Payer: Self-pay

## 2023-10-27 ENCOUNTER — Emergency Department (HOSPITAL_BASED_OUTPATIENT_CLINIC_OR_DEPARTMENT_OTHER)
Admission: EM | Admit: 2023-10-27 | Discharge: 2023-10-27 | Disposition: A | Discharge status: Home / Self Care | Attending: Emergency Medicine | Admitting: Emergency Medicine

## 2023-10-27 DIAGNOSIS — Z7984 Long term (current) use of oral hypoglycemic drugs: Secondary | ICD-10-CM | POA: Diagnosis not present

## 2023-10-27 DIAGNOSIS — R0781 Pleurodynia: Secondary | ICD-10-CM | POA: Diagnosis not present

## 2023-10-27 DIAGNOSIS — M25561 Pain in right knee: Secondary | ICD-10-CM | POA: Diagnosis not present

## 2023-10-27 DIAGNOSIS — Z96653 Presence of artificial knee joint, bilateral: Secondary | ICD-10-CM | POA: Diagnosis not present

## 2023-10-27 DIAGNOSIS — S51812A Laceration without foreign body of left forearm, initial encounter: Secondary | ICD-10-CM | POA: Insufficient documentation

## 2023-10-27 DIAGNOSIS — E119 Type 2 diabetes mellitus without complications: Secondary | ICD-10-CM | POA: Diagnosis not present

## 2023-10-27 DIAGNOSIS — I6782 Cerebral ischemia: Secondary | ICD-10-CM | POA: Diagnosis not present

## 2023-10-27 DIAGNOSIS — M25562 Pain in left knee: Secondary | ICD-10-CM | POA: Insufficient documentation

## 2023-10-27 DIAGNOSIS — W06XXXA Fall from bed, initial encounter: Secondary | ICD-10-CM | POA: Insufficient documentation

## 2023-10-27 DIAGNOSIS — W19XXXA Unspecified fall, initial encounter: Secondary | ICD-10-CM

## 2023-10-27 MED ORDER — OXYCODONE HCL 5 MG PO TABS: 5.0000 mg | ORAL_TABLET | Freq: Four times a day (QID) | ORAL | 0 refills | Status: AC | PRN

## 2023-10-27 MED ORDER — OXYCODONE-ACETAMINOPHEN 5-325 MG PO TABS
1.0000 | ORAL_TABLET | Freq: Once | ORAL | Status: AC
  Administered 2023-10-27: 1 via ORAL
  Filled 2023-10-27: qty 1

## 2023-10-27 NOTE — Discharge Instructions (Addendum)
 As discussed, your imaging is reassuring.  Your pain is most likely secondary to bruising or contusion.  Alternate between ibuprofen 600 mg and Tylenol  500 mg every 4 hours for pain.  Take oxycodone  every 6 hours as needed for breakthrough pain.  This medication can cause drowsiness so do not drive or operate heavy machinery while taking this medication.  Follow-up with your primary care provider in the next 3 to 5 days for reevaluation.  Get help right away if you: Lose consciousness or have trouble moving after a fall. Have a fall that causes a head injury.

## 2023-10-27 NOTE — ED Triage Notes (Addendum)
 Pt ambulatory to triage with rollator. Pt reports falling out of bed 2 nights ago trying to pick phone up off the floor. Pt reports bilateral knee pain. Recent left knee replacement  Bruising to left inner knee to shin. Also reports pain left posterior rib pain. No bruising. Denies SOB Skin tear left forearm. No blood thinners

## 2023-10-27 NOTE — ED Provider Notes (Addendum)
 [place] [facility] Provider Note   CSN: [id number] Arrival date & time: 10/27/23  1707     History  Chief Complaint  Patient presents with   Fall   Knee Pain    Olivia Walker is a 73 y.o. female with a history of diabetes mellitus, osteoarthritis, and anemia presents the ED today for knee pain.  Patient reports pain to the bilateral knees after a fall 2 days ago.  States that her phone fell out of the bed and she tried to lean over in the bed to pick it up off the ground when she fell.  Reports hitting her head and landing on her knees.  Reports left knee replacement about 6 weeks ago.  Right knee was done prior to that.  Also endorses pain to the left posterior ribs.  No chest pain or shortness of breath.  Denies loss of consciousness or blood thinner use.  Has been taking Tylenol  without much improvement of symptoms.    Home Medications Prior to Admission medications   Medication Sig Start Date End Date Taking? Authorizing Provider  oxyCODONE  (ROXICODONE ) 5 MG immediate release tablet Take 1 tablet (5 mg total) by mouth every 6 (six) hours as needed for up to 5 days for severe pain (pain score 7-10). 10/27/23 11/01/23 Yes Sonnie Dusky, PA-C  acetaminophen  (TYLENOL ) 650 MG CR tablet Take 1,300 mg by mouth every 8 (eight) hours as needed for pain.    [provider]  ALPRAZolam  (XANAX ) 0.5 MG tablet Take 0.25 mg by mouth at bedtime.    [provider]  cetirizine (ZYRTEC) 10 MG tablet Take 10 mg by mouth daily.    [provider]  cycloSPORINE  (RESTASIS ) 0.05 % ophthalmic emulsion 1 drop 2 (two) times daily.    [provider]  doxycycline  (VIBRA -TABS) 100 MG tablet Take 1 tablet (100 mg total) by mouth 2 (two) times daily. 09/23/23   Velma Ghazi, DPM  FLUoxetine  (PROZAC ) 20 MG tablet Take 40 mg by mouth at bedtime.    [provider]  gabapentin  (NEURONTIN ) 300 MG capsule Take a 300 mg capsule three times  a day for two weeks following surgery.Then take a 300 mg capsule two times a day for two weeks. Then take a 300 mg capsule once a day for two weeks. Then discontinue. Patient taking differently: Take 100 mg by mouth 2 (two) times daily. 12/24/21   Edmisten, Kristie L, PA  glipiZIDE  (GLUCOTROL  XL) 10 MG 24 hr tablet Take 10 mg by mouth daily with breakfast.    [provider]  GLUCOSAMINE-CHONDROITIN PO Take 1 tablet by mouth daily.    [provider]  glucose blood (ONETOUCH VERIO) test strip Once daily testing E11.9    [provider]  metFORMIN (GLUCOPHAGE-XR) 750 MG 24 hr tablet Take 750 mg by mouth 2 (two) times daily. 09/11/21   [provider]  methocarbamol  (ROBAXIN ) 500 MG tablet Take 1 tablet (500 mg total) by mouth every 6 (six) hours as needed for muscle spasms. 09/01/23   Edmisten, Kristie L, PA  Multiple Vitamins-Minerals (MULTIVITAMIN WITH MINERALS) tablet Take 1 tablet by mouth daily.    [provider]  Omega-3 Fatty Acids (FISH OIL PO) Take 4,300 mg by mouth in the morning. 2150 mg each    [provider]  ondansetron  (ZOFRAN ) 4 MG tablet Take 1 tablet (4 mg total) by mouth every 6 (six) hours as needed for nausea. 09/01/23  Edmisten, Kristie L, PA  ondansetron  (ZOFRAN -ODT) 4 MG disintegrating tablet Take 4 mg by mouth every 8 (eight) hours as needed for nausea or vomiting.    [provider]  promethazine  (PHENERGAN ) 25 MG suppository Place 25 mg rectally every 6 (six) hours as needed for nausea or vomiting.    [provider]  tamsulosin  (FLOMAX ) 0.4 MG CAPS capsule Take 1 capsule (0.4 mg total) by mouth daily. Patient taking differently: Take 0.4 mg by mouth daily as needed (Kidney stones). 08/12/18   Palumbo, April, MD  traMADol  (ULTRAM ) 50 MG tablet Take 1-2 tablets (50-100 mg total) by mouth every 6 (six) hours as needed for moderate pain (pain score 4-6). 09/01/23   Edmisten, Kristie L, PA  traZODone  (DESYREL ) 50 MG  tablet Take 150 mg by mouth at bedtime. 10/04/21   [provider]  TURMERIC PO Take 1 tablet by mouth daily.    [provider]      Allergies    Nitrofurantoin  macrocrystal and Penicillins    Review of Systems   Review of Systems  Musculoskeletal:  Positive for arthralgias.  All other systems reviewed and are negative.   Physical Exam Updated Vital Signs BP (!) 154/105 (BP Location: Right Arm)   Pulse 72   Temp 99.1 F (37.3 C) (Oral)   Resp 20   Wt 91 kg   SpO2 95%   BMI 33.38 kg/m  Physical Exam Vitals and nursing note reviewed.  Constitutional:      General: She is not in acute distress.    Appearance: Normal appearance.  HENT:     Head: Normocephalic and atraumatic.     Mouth/Throat:     Mouth: Mucous membranes are moist.  Eyes:     Conjunctiva/sclera: Conjunctivae normal.     Pupils: Pupils are equal, round, and reactive to light.  Cardiovascular:     Rate and Rhythm: Normal rate and regular rhythm.     Pulses: Normal pulses.     Heart sounds: Normal heart sounds.  Pulmonary:     Effort: Pulmonary effort is normal.     Breath sounds: Normal breath sounds.  Abdominal:     Palpations: Abdomen is soft.     Tenderness: There is no abdominal tenderness.  Musculoskeletal:        General: Tenderness present. Normal range of motion.     Cervical back: Normal range of motion.     Comments: Healing scar to left knee from recent surgery.  Some swelling to the knee, which patient states is from the surgery.  Tenderness to palpation of medial and lateral left and right knees as well as left lateral posterior ribs.  Range of motion and strength of upper and lower extremities intact bilaterally.  Skin:    General: Skin is warm and dry.     Findings: No rash.     Comments: Skin tear at left dorsal forearm, with no active bleeding.  Bacitracin ointment applied and wound redressed.  Neurological:     General: No focal deficit present.     Mental Status:  She is alert.     Sensory: No sensory deficit.     Motor: No weakness.  Psychiatric:        Mood and Affect: Mood normal.        Behavior: Behavior normal.    ED Results / Procedures / Treatments   Labs (all labs ordered are listed, but only abnormal results are displayed) Labs Reviewed - No data to  display  EKG None  Radiology CT Head Wo Contrast Result Date: 10/27/2023 CLINICAL DATA:  Fall out of bed 2 nights ago. EXAM: CT HEAD WITHOUT CONTRAST TECHNIQUE: Contiguous axial images were obtained from the base of the skull through the vertex without intravenous contrast. RADIATION DOSE REDUCTION: This exam was performed according to the departmental dose-optimization program which includes automated exposure control, adjustment of the mA and/or kV according to patient size and/or use of iterative reconstruction technique. COMPARISON:  None Available. FINDINGS: Brain: No intracranial hemorrhage, mass effect, or midline shift. No hydrocephalus. The basilar cisterns are patent. Mild periventricular chronic small vessel ischemia. Remote lacunar infarct versus perivascular space in the right basal ganglia. No evidence of territorial infarct or acute ischemia. No extra-axial or intracranial fluid collection. Vascular: Atherosclerosis of skullbase vasculature without hyperdense vessel or abnormal calcification. Skull: No fracture or focal lesion. Sinuses/Orbits: Paranasal sinuses and mastoid air cells are clear. The visualized orbits are unremarkable. Other: None. IMPRESSION: 1. No acute intracranial abnormality. No skull fracture. 2. Mild chronic small vessel ischemia. Electronically Signed   By: Chadwick Colonel M.D.   On: 10/27/2023 22:15   DG Ribs Unilateral W/Chest Left Result Date: 10/27/2023 CLINICAL DATA:  Rib pain, fell out of bed EXAM: LEFT RIBS AND CHEST - 3+ VIEW COMPARISON:  None Available. FINDINGS: No fracture or other bone lesions are seen involving the ribs. There is no pneumothorax or  pleural effusion. Both lungs are clear. Heart size and mediastinal contours are within normal limits. IMPRESSION: Negative radiographs of the chest and left ribs. Electronically Signed   By: Chadwick Colonel M.D.   On: 10/27/2023 18:22   DG Knee Complete 4 Views Left Result Date: 10/27/2023 CLINICAL DATA:  Pain after fall from bed. Recent left knee replacement. EXAM: LEFT KNEE - COMPLETE 4+ VIEW COMPARISON:  None Available. FINDINGS: Left knee arthroplasty in expected alignment. No periprosthetic lucency. No acute or periprosthetic fracture. Patellar resurfacing. Small joint effusion. Mild generalized soft tissue edema. IMPRESSION: 1. Left knee arthroplasty without complication or acute fracture. 2. Small joint effusion. Electronically Signed   By: Chadwick Colonel M.D.   On: 10/27/2023 18:21   DG Knee Complete 4 Views Right Result Date: 10/27/2023 CLINICAL DATA:  Pain after fall out of bed. EXAM: RIGHT KNEE - COMPLETE 4+ VIEW COMPARISON:  None Available. FINDINGS: Right knee arthroplasty in expected alignment. No periprosthetic lucency. No acute or periprosthetic fracture. Previous patellar resurfacing. No significant joint effusion. IMPRESSION: Right knee arthroplasty without complication or acute fracture. Electronically Signed   By: Chadwick Colonel M.D.   On: 10/27/2023 18:20    Procedures Procedures    Medications Ordered in ED Medications  oxyCODONE -acetaminophen  (PERCOCET/ROXICET) 5-325 MG per tablet 1 tablet (1 tablet Oral Given 10/27/23 2137)    ED Course/ Medical Decision Making/ A&P                                 Medical Decision Making Amount and/or Complexity of Data Reviewed Radiology: ordered.  Risk Prescription drug management.   This patient presents to the ED for concern of fall, this involves an extensive number of treatment options, and is a complaint that carries with it a high risk of complications and morbidity.   Differential diagnosis includes: Fracture,  dislocation, damage to hardware, contusion, abrasion, hematoma, etc.   Comorbidities  See HPI above   Additional History  Additional history obtained from prior records   Imaging  Studies  I ordered imaging studies including x-ray of knees and left ribs  I independently visualized and interpreted imaging which showed:  Negative x-rays of the chest and left ribs. Right knee x-ray shows arthroplasty without complication or acute fracture. Left knee arthroplasty without complication or acute fracture.  Small joint effusion. I agree with the radiologist interpretation   Problem List / ED Course / Critical Interventions / Medication Management  Patient fell out of bed trying to reach for her phone that fell on the ground.  States that she hit her head but did not lose consciousness.  No blood thinner use.  Has pain to the left side of the ribs and bilateral knees.  Had a recent left knee replacement 6 weeks ago.  Is still able to walk using her walker, as normal.  Maintains full range of motion of her knees.  Low suspicion for acute bony injury to the knees. Does have tenderness to the left posterior lateral ribs, worse with movement. No associated chest pain or shortness of breath.  No confusion, vision changes, or weakness. Has been taking Tylenol  for symptoms without much improvement. I ordered medications including: Percocet for pain.  Patient has family at bedside that will drive her home. Reevaluation of the patient after these medicines showed that the patient stayed the same I have reviewed the patients home medicines and have made adjustments as needed   Social Determinants of Health  Physical activity   Test / Admission - Considered  Discussed plans with patient and family at bedside.  All questions answered. She is stable and safe for discharge home.  Advised close PCP follow-up. Return precautions given.       Final Clinical Impression(s) / ED Diagnoses Final  diagnoses:  Fall, initial encounter    Rx / DC Orders ED Discharge Orders          Ordered    oxyCODONE  (ROXICODONE ) 5 MG immediate release tablet  Every 6 hours PRN        10/27/23 2235              Sonnie Dusky, PA-C 10/27/23 2240    Sonnie Dusky, PA-C 10/27/23 2241    Rosealee Concha, MD 10/27/23 2334

## 2023-10-28 ENCOUNTER — Ambulatory Visit: Admitting: Podiatry

## 2023-10-28 DIAGNOSIS — L97521 Non-pressure chronic ulcer of other part of left foot limited to breakdown of skin: Secondary | ICD-10-CM

## 2023-10-28 NOTE — Progress Notes (Signed)
 Subjective:  Patient ID: Olivia Walker, female    DOB: [birth year],  MRN: [id number]  Chief Complaint  Patient presents with   Foot Ulcer    Left toe ulcer follow up     73 y.o. female presents with the above complaint.  Patient presents with left hallux ulceration limited to the breakdown of the skin denies any other acute complaints.   Review of Systems: Negative except as noted in the HPI. Denies N/V/F/Ch.  Past Medical History:  Diagnosis Date   Anemia    on and off   Ankle swelling    Anxiety    Arthritis    Chronic pain    knees, hips   Depression with anxiety 10/08/2012    Treated since 1995 with Prozac , disability.    Diabetes mellitus without complication (HCC)    prediabetic   Dyspnea    GERD (gastroesophageal reflux disease)    Hemorrhoids    mild   High cholesterol    History of kidney stones    Impaired glucose tolerance    OA (osteoarthritis)    hips, knees   Obesity    PONV (postoperative nausea and vomiting)    Sleep apnea    Sleep disorder breathing 06/17/2022   Stones in the urinary tract     Current Outpatient Medications:    acetaminophen  (TYLENOL ) 650 MG CR tablet, Take 1,300 mg by mouth every 8 (eight) hours as needed for pain., Disp: , Rfl:    ALPRAZolam  (XANAX ) 0.5 MG tablet, Take 0.25 mg by mouth at bedtime., Disp: , Rfl:    cetirizine (ZYRTEC) 10 MG tablet, Take 10 mg by mouth daily., Disp: , Rfl:    cycloSPORINE  (RESTASIS ) 0.05 % ophthalmic emulsion, 1 drop 2 (two) times daily., Disp: , Rfl:    doxycycline  (VIBRA -TABS) 100 MG tablet, Take 1 tablet (100 mg total) by mouth 2 (two) times daily., Disp: 20 tablet, Rfl: 0   FLUoxetine  (PROZAC ) 20 MG tablet, Take 40 mg by mouth at bedtime., Disp: , Rfl:    gabapentin  (NEURONTIN ) 300 MG capsule, Take a 300 mg capsule three times a day for two weeks following surgery.Then take a 300 mg capsule two times a day for two weeks. Then take a 300 mg capsule once a day for two weeks. Then discontinue.  (Patient taking differently: Take 100 mg by mouth 2 (two) times daily.), Disp: 84 capsule, Rfl: 0   glipiZIDE  (GLUCOTROL  XL) 10 MG 24 hr tablet, Take 10 mg by mouth daily with breakfast., Disp: , Rfl:    GLUCOSAMINE-CHONDROITIN PO, Take 1 tablet by mouth daily., Disp: , Rfl:    glucose blood (ONETOUCH VERIO) test strip, Once daily testing E11.9, Disp: , Rfl:    metFORMIN (GLUCOPHAGE-XR) 750 MG 24 hr tablet, Take 750 mg by mouth 2 (two) times daily., Disp: , Rfl:    methocarbamol  (ROBAXIN ) 500 MG tablet, Take 1 tablet (500 mg total) by mouth every 6 (six) hours as needed for muscle spasms., Disp: 40 tablet, Rfl: 0   Multiple Vitamins-Minerals (MULTIVITAMIN WITH MINERALS) tablet, Take 1 tablet by mouth daily., Disp: , Rfl:    Omega-3 Fatty Acids (FISH OIL PO), Take 4,300 mg by mouth in the morning. 2150 mg each, Disp: , Rfl:    ondansetron  (ZOFRAN ) 4 MG tablet, Take 1 tablet (4 mg total) by mouth every 6 (six) hours as needed for nausea., Disp: 20 tablet, Rfl: 0   ondansetron  (ZOFRAN -ODT) 4 MG disintegrating tablet, Take 4 mg by mouth every 8 (  eight) hours as needed for nausea or vomiting., Disp: , Rfl:    oxyCODONE  (ROXICODONE ) 5 MG immediate release tablet, Take 1 tablet (5 mg total) by mouth every 6 (six) hours as needed for up to 5 days for severe pain (pain score 7-10)., Disp: 20 tablet, Rfl: 0   promethazine  (PHENERGAN ) 25 MG suppository, Place 25 mg rectally every 6 (six) hours as needed for nausea or vomiting., Disp: , Rfl:    tamsulosin  (FLOMAX ) 0.4 MG CAPS capsule, Take 1 capsule (0.4 mg total) by mouth daily. (Patient taking differently: Take 0.4 mg by mouth daily as needed (Kidney stones).), Disp: 30 capsule, Rfl: 0   traMADol  (ULTRAM ) 50 MG tablet, Take 1-2 tablets (50-100 mg total) by mouth every 6 (six) hours as needed for moderate pain (pain score 4-6)., Disp: 40 tablet, Rfl: 0   traZODone  (DESYREL ) 50 MG tablet, Take 150 mg by mouth at bedtime., Disp: , Rfl:    TURMERIC PO, Take 1  tablet by mouth daily., Disp: , Rfl:   Social History   Tobacco Use  Smoking Status Never  Smokeless Tobacco Never    Allergies  Allergen Reactions   Nitrofurantoin  Macrocrystal Nausea And Vomiting    Makes very sick   Penicillins     From when younger  Tolerated Cephalosporin Date: 12/24/21. Tolerated Cephalosporin Date: 09/01/23.       Objective:  There were no vitals filed for this visit. There is no height or weight on file to calculate BMI. Constitutional Well developed. Well nourished.  Vascular Dorsalis pedis pulses palpable bilaterally. Posterior tibial pulses palpable bilaterally. Capillary refill normal to all digits.  No cyanosis or clubbing noted. Pedal hair growth normal.  Neurologic Normal speech. Oriented to person, place, and time. Epicritic sensation to light touch grossly present bilaterally.  Dermatologic Skin completely epithelialized.  No signs of infection noted no breakdown noted.  No malodor present Skin within normal limits  Orthopedic: Normal joint ROM without pain or crepitus bilaterally. No visible deformities. No bony tenderness.   Radiographs: None Assessment:   1. Chronic toe ulcer, left, limited to breakdown of skin [facility])     Plan:  Patient was evaluated and treated and all questions answered.  Left hallux ulceration limited to breakdown of skin with history of total nail avulsion - Clinic healed and officially discharged from the care of any foot and ankle issues in the future she will come back and see me.  No further signs of ulceration noted.  Discussed shoe gear modification return to regular activities

## 2023-11-02 ENCOUNTER — Other Ambulatory Visit: Payer: Self-pay

## 2023-11-02 ENCOUNTER — Encounter (HOSPITAL_BASED_OUTPATIENT_CLINIC_OR_DEPARTMENT_OTHER): Payer: Self-pay

## 2023-11-02 ENCOUNTER — Emergency Department (HOSPITAL_BASED_OUTPATIENT_CLINIC_OR_DEPARTMENT_OTHER)
Admission: EM | Admit: 2023-11-02 | Discharge: 2023-11-02 | Disposition: A | Discharge status: Home / Self Care | Attending: Emergency Medicine | Admitting: Emergency Medicine

## 2023-11-02 ENCOUNTER — Emergency Department (HOSPITAL_BASED_OUTPATIENT_CLINIC_OR_DEPARTMENT_OTHER)

## 2023-11-02 DIAGNOSIS — R1032 Left lower quadrant pain: Secondary | ICD-10-CM | POA: Diagnosis present

## 2023-11-02 DIAGNOSIS — Z7984 Long term (current) use of oral hypoglycemic drugs: Secondary | ICD-10-CM | POA: Insufficient documentation

## 2023-11-02 DIAGNOSIS — N189 Chronic kidney disease, unspecified: Secondary | ICD-10-CM | POA: Insufficient documentation

## 2023-11-02 DIAGNOSIS — E1122 Type 2 diabetes mellitus with diabetic chronic kidney disease: Secondary | ICD-10-CM | POA: Diagnosis not present

## 2023-11-02 DIAGNOSIS — Z87442 Personal history of urinary calculi: Secondary | ICD-10-CM | POA: Insufficient documentation

## 2023-11-02 DIAGNOSIS — E1165 Type 2 diabetes mellitus with hyperglycemia: Secondary | ICD-10-CM | POA: Diagnosis not present

## 2023-11-02 LAB — URINALYSIS, ROUTINE W REFLEX MICROSCOPIC
Glucose, UA: 100 mg/dL — AB
Hgb urine dipstick: NEGATIVE
Ketones, ur: 15 mg/dL — AB
Leukocytes,Ua: NEGATIVE
Nitrite: NEGATIVE
Protein, ur: NEGATIVE mg/dL
Specific Gravity, Urine: >=1.03 (ref 1.005–1.030)
pH: 5.5 (ref 5.0–8.0)

## 2023-11-02 LAB — COMPREHENSIVE METABOLIC PANEL WITH GFR
ALT: 13 U/L (ref 0–44)
AST: 20 U/L (ref 15–41)
Albumin: 4.1 g/dL (ref 3.5–5.0)
Alkaline Phosphatase: 64 U/L (ref 38–126)
Anion gap: 15 (ref 5–15)
BUN: 11 mg/dL (ref 8–23)
CO2: 23 mmol/L (ref 22–32)
Calcium: 9.7 mg/dL (ref 8.9–10.3)
Chloride: 101 mmol/L (ref 98–111)
Creatinine, Ser: 0.95 mg/dL (ref 0.44–1.00)
GFR, Estimated: >60 mL/min (ref >60)
Glucose, Bld: 250 mg/dL — ABNORMAL HIGH (ref 70–99)
Potassium: 4.5 mmol/L (ref 3.5–5.1)
Sodium: 139 mmol/L (ref 135–145)
Total Bilirubin: <0.2 mg/dL (ref 0.0–1.2)
Total Protein: 7.1 g/dL (ref 6.5–8.1)

## 2023-11-02 LAB — CBC WITH DIFFERENTIAL/PLATELET
Abs Immature Granulocytes: 0.05 10*3/uL (ref 0.00–0.07)
Basophils Absolute: 0 10*3/uL (ref 0.0–0.1)
Basophils Relative: 0 %
Eosinophils Absolute: 0 10*3/uL (ref 0.0–0.5)
Eosinophils Relative: 0 %
HCT: 38.6 % (ref 36.0–46.0)
Hemoglobin: 12.1 g/dL (ref 12.0–15.0)
Immature Granulocytes: 1 %
Lymphocytes Relative: 9 %
Lymphs Abs: 0.8 10*3/uL (ref 0.7–4.0)
MCH: 25.6 pg — ABNORMAL LOW (ref 26.0–34.0)
MCHC: 31.3 g/dL (ref 30.0–36.0)
MCV: 81.6 fL (ref 80.0–100.0)
Monocytes Absolute: 0.2 10*3/uL (ref 0.1–1.0)
Monocytes Relative: 2 %
Neutro Abs: 8.1 10*3/uL — ABNORMAL HIGH (ref 1.7–7.7)
Neutrophils Relative %: 88 %
Platelets: 359 10*3/uL (ref 150–400)
RBC: 4.73 MIL/uL (ref 3.87–5.11)
RDW: 16.6 % — ABNORMAL HIGH (ref 11.5–15.5)
WBC: 9.2 10*3/uL (ref 4.0–10.5)
nRBC: 0 % (ref 0.0–0.2)

## 2023-11-02 LAB — LIPASE, BLOOD: Lipase: 28 U/L (ref 11–51)

## 2023-11-02 MED ORDER — SODIUM CHLORIDE 0.9 % IV BOLUS
1000.0000 mL | Freq: Once | INTRAVENOUS | Status: AC
  Administered 2023-11-02: 1000 mL via INTRAVENOUS

## 2023-11-02 MED ORDER — IOHEXOL 300 MG/ML  SOLN
100.0000 mL | Freq: Once | INTRAMUSCULAR | Status: AC | PRN
  Administered 2023-11-02: 100 mL via INTRAVENOUS

## 2023-11-02 MED ORDER — KETOROLAC TROMETHAMINE 30 MG/ML IJ SOLN
15.0000 mg | Freq: Once | INTRAMUSCULAR | Status: AC
  Administered 2023-11-02: 15 mg via INTRAVENOUS
  Filled 2023-11-02: qty 1

## 2023-11-02 NOTE — ED Triage Notes (Signed)
 Pt reports that she is her for pain in her left ovary x 2-3 days. States that she has been nursing pain with oxycodone .

## 2023-11-02 NOTE — ED Notes (Signed)
 Patient transported to CT

## 2023-11-02 NOTE — Discharge Instructions (Signed)
 You were seen in the Emergency Department for abdominal pain Your blood work showed high blood sugar but no other abnormalities The CAT scan did not show any obvious cause of your pain We are not certain what is causing your abdominal pain You continue taking the pain medication as prescribed and follow-up with your primary care doctor Return to the emergency department for severe abdominal pain or any other concerns

## 2023-11-02 NOTE — ED Provider Notes (Signed)
 [facility] Provider Note   CSN: [id number] Arrival date & time: 11/02/23  1520     History  Chief Complaint  Patient presents with   Groin Pain    Olivia Walker is a 73 y.o. female.  With a history of type 2 diabetes, CKD and kidney stones who presents to the ED for abdominal pain.  2 days of sharp left lower quadrant/suprapubic pain.  No urinary symptoms, changes in bowel habits, nausea, vomiting, fevers or chills.  Last symptomatic kidney stone was a couple years ago.  Unresolved after taking extra dose of prednisone  and oxycodone  at home.  Recent knee surgery in April.  Has been recovering well at home.   Groin Pain       Home Medications Prior to Admission medications   Medication Sig Start Date End Date Taking? Authorizing Provider  acetaminophen  (TYLENOL ) 650 MG CR tablet Take 1,300 mg by mouth every 8 (eight) hours as needed for pain.    [provider]  ALPRAZolam  (XANAX ) 0.5 MG tablet Take 0.25 mg by mouth at bedtime.    [provider]  cetirizine (ZYRTEC) 10 MG tablet Take 10 mg by mouth daily.    [provider]  cycloSPORINE  (RESTASIS ) 0.05 % ophthalmic emulsion 1 drop 2 (two) times daily.    [provider]  doxycycline  (VIBRA -TABS) 100 MG tablet Take 1 tablet (100 mg total) by mouth 2 (two) times daily. 09/23/23   Velma Ghazi, DPM  FLUoxetine  (PROZAC ) 20 MG tablet Take 40 mg by mouth at bedtime.    [provider]  gabapentin  (NEURONTIN ) 300 MG capsule Take a 300 mg capsule three times a day for two weeks following surgery.Then take a 300 mg capsule two times a day for two weeks. Then take a 300 mg capsule once a day for two weeks. Then discontinue. Patient taking differently: Take 100 mg by mouth 2 (two) times daily. 12/24/21   Edmisten, Onesimo Bijou, PA  glipiZIDE  (GLUCOTROL  XL) 10 MG 24 hr tablet Take 10 mg by mouth daily with breakfast.    [provider]   GLUCOSAMINE-CHONDROITIN PO Take 1 tablet by mouth daily.    [provider]  glucose blood (ONETOUCH VERIO) test strip Once daily testing E11.9    [provider]  metFORMIN (GLUCOPHAGE-XR) 750 MG 24 hr tablet Take 750 mg by mouth 2 (two) times daily. 09/11/21   [provider]  methocarbamol  (ROBAXIN ) 500 MG tablet Take 1 tablet (500 mg total) by mouth every 6 (six) hours as needed for muscle spasms. 09/01/23   Edmisten, Kristie L, PA  Multiple Vitamins-Minerals (MULTIVITAMIN WITH MINERALS) tablet Take 1 tablet by mouth daily.    [provider]  Omega-3 Fatty Acids (FISH OIL PO) Take 4,300 mg by mouth in the morning. 2150 mg each    [provider]  ondansetron  (ZOFRAN ) 4 MG tablet Take 1 tablet (4 mg total) by mouth every 6 (six) hours as needed for nausea. 09/01/23   Edmisten, Kristie L, PA  ondansetron  (ZOFRAN -ODT) 4 MG disintegrating tablet Take 4 mg by mouth every 8 (eight) hours as needed for nausea or vomiting.    [provider]  promethazine  (PHENERGAN ) 25 MG suppository Place 25 mg rectally every 6 (six) hours as needed for nausea or vomiting.    [provider]  tamsulosin  (FLOMAX ) 0.4 MG CAPS capsule Take 1 capsule (0.4 mg total) by mouth daily. Patient taking differently: Take 0.4 mg  by mouth daily as needed (Kidney stones). 08/12/18   Palumbo, April, MD  traMADol  (ULTRAM ) 50 MG tablet Take 1-2 tablets (50-100 mg total) by mouth every 6 (six) hours as needed for moderate pain (pain score 4-6). 09/01/23   Edmisten, Kristie L, PA  traZODone  (DESYREL ) 50 MG tablet Take 150 mg by mouth at bedtime. 10/04/21   [provider]  TURMERIC PO Take 1 tablet by mouth daily.    [provider]      Allergies    Nitrofurantoin  macrocrystal and Penicillins    Review of Systems   Review of Systems  Physical Exam Updated Vital Signs BP (!) 159/98 (BP Location: Right Wrist)   Pulse 91   Temp 99.2 F (37.3 C) (Oral)    Resp 18   Ht 5\' 5"  (1.651 m)   Wt 95.3 kg   SpO2 96%   BMI 34.95 kg/m  Physical Exam Vitals and nursing note reviewed.  HENT:     Head: Normocephalic and atraumatic.  Eyes:     Pupils: Pupils are equal, round, and reactive to light.  Cardiovascular:     Rate and Rhythm: Normal rate and regular rhythm.  Pulmonary:     Effort: Pulmonary effort is normal.     Breath sounds: Normal breath sounds.  Abdominal:     Palpations: Abdomen is soft.     Tenderness: There is abdominal tenderness.     Comments: Left lower quadrant and left suprapubic tenderness without rebound rigidity or guarding  Skin:    General: Skin is warm and dry.  Neurological:     Mental Status: She is alert.  Psychiatric:        Mood and Affect: Mood normal.     ED Results / Procedures / Treatments   Labs (all labs ordered are listed, but only abnormal results are displayed) Labs Reviewed  URINALYSIS, ROUTINE W REFLEX MICROSCOPIC - Abnormal; Notable for the following components:      Result Value   Glucose, UA 100 (*)    Bilirubin Urine SMALL (*)    Ketones, ur 15 (*)    All other components within normal limits  COMPREHENSIVE METABOLIC PANEL WITH GFR - Abnormal; Notable for the following components:   Glucose, Bld 250 (*)    All other components within normal limits  CBC WITH DIFFERENTIAL/PLATELET - Abnormal; Notable for the following components:   MCH 25.6 (*)    RDW 16.6 (*)    Neutro Abs 8.1 (*)    All other components within normal limits  LIPASE, BLOOD    EKG None  Radiology CT ABDOMEN PELVIS W CONTRAST Result Date: 11/02/2023 CLINICAL DATA:  Left lower quadrant pain EXAM: CT ABDOMEN AND PELVIS WITH CONTRAST TECHNIQUE: Multidetector CT imaging of the abdomen and pelvis was performed using the standard protocol following bolus administration of intravenous contrast. RADIATION DOSE REDUCTION: This exam was performed according to the departmental dose-optimization program which includes automated  exposure control, adjustment of the mA and/or kV according to patient size and/or use of iterative reconstruction technique. CONTRAST:  OMNIPAQUE IOHEXOL 300 MG/ML  SOLN COMPARISON:  CT 03/11/2023 FINDINGS: Lower chest: No acute abnormality. Hepatobiliary: No focal liver abnormality is seen. Status post cholecystectomy. No biliary dilatation. Pancreas: Unremarkable. No pancreatic ductal dilatation or surrounding inflammatory changes. Spleen: Normal in size without focal abnormality. Adrenals/Urinary Tract: Adrenal glands are normal. Kidneys show no hydronephrosis. Limited assessment for stone disease due to excreted contrast in the renal collecting systems. No hydronephrosis. The bladder  is unremarkable Stomach/Bowel: Stomach is within normal limits. Appendix appears normal. No evidence of bowel wall thickening, distention, or inflammatory changes. Vascular/Lymphatic: Aortic atherosclerosis. No enlarged abdominal or pelvic lymph nodes. Reproductive: Uterus and bilateral adnexa are unremarkable. Other: Negative for pelvic effusion or free air Musculoskeletal: Right hip replacement. No acute osseous abnormality IMPRESSION: 1. No CT evidence for acute intra-abdominal or pelvic abnormality. 2. Aortic atherosclerosis. Aortic Atherosclerosis (ICD10-I70.0). Electronically Signed   By: Esmeralda Hedge M.D.   On: 11/02/2023 19:08    Procedures Procedures    Medications Ordered in ED Medications  sodium chloride  0.9 % bolus 1,000 mL (0 mLs Intravenous Stopped 11/02/23 1756)  ketorolac  (TORADOL ) 30 MG/ML injection 15 mg (15 mg Intravenous Given 11/02/23 1709)  iohexol (OMNIPAQUE) 300 MG/ML solution 100 mL (100 mLs Intravenous Contrast Given 11/02/23 1804)    ED Course/ Medical Decision Making/ A&P Clinical Course as of 11/02/23 1922  Mon Nov 02, 2023  1915 Laboratory workup shows no leukocytosis or significant renal or hepatic impairment.  UA negative.  Hyperglycemia of 250 on CMP.  Discussed this with the  patient.  She is adjusting her diabetic regimen with her PCP as she has recently lost a lot of weight.  Has all of her medications and supplies at home.  Low suspicion for HHS or DKA.  CT abdomen pelvis did not show any acute intra-abdominal abnormalities that would explain patient's left lower quadrant pain.  There may be a small stone we are unable to see with a contrast but again no blood or infection in the urine.  Appropriate for discharge with PCP follow-up. [MP]    Clinical Course User Index [MP] Sallyanne Creamer, DO                                 Medical Decision Making 73 year old female with history as above presenting for 3 days of left lower quadrant pain.  Sharp in quality.  Unresolved after prednisone  and oxycodone  at home.  No bowel or urinary symptoms.  Some left lower quadrant tenderness on exam without CVA tenderness.  Differential diagnosis includes: Acute intra-abdominal infectious/inflammatory process such as appendicitis, diverticulitis, pancreatitis and cholecystitis Urinary tract infection Kidney stone Ovarian cyst Viral gastroenteritis  Will obtain laboratory workup including CBC with differential, metabolic panel, lipase and urinalysis along with CT abdomen pelvis to evaluate for above pathologies Will provide IV fluids and Toradol  for pain relief.  Patient took oxycodone  at home prior to arrival  Amount and/or Complexity of Data Reviewed Labs: ordered. Radiology: ordered.  Risk Prescription drug management.           Final Clinical Impression(s) / ED Diagnoses Final diagnoses:  Left lower quadrant abdominal pain  Type 2 diabetes mellitus with hyperglycemia, unspecified whether long term insulin  use [facility])    Rx / DC Orders ED Discharge Orders     None         Sallyanne Creamer, DO 11/02/23 1922

## 2024-03-03 DIAGNOSIS — E785 Hyperlipidemia, unspecified: Secondary | ICD-10-CM | POA: Insufficient documentation

## 2024-03-03 NOTE — Progress Notes (Unsigned)
  Cardiology Office Note:   Date:  03/03/2024  ID:  Olivia Walker, DOB [birth year], MRN [id number] PCP: Boneta Ricci BRAVO, PA-C  [facility] Providers Cardiologist:  Lynwood Schilling, MD {  History of Present Illness:   ASHELY Walker is a 73 y.o. female who is referred by Boneta, [place]  E, PA-C for evaluation of abnormal EKG.   She was noted to have an arrhythmia on EKG.   I do not actually have a copy of that EKG.  Today she is noted to have some premature atrial contractions.  She has felt these over the years.  She has not had any prior cardiac history.  She has not had any prior cardiac testing.  The patient denies any new symptoms such as chest discomfort, neck or arm discomfort. There has been no new shortness of breath, PND or orthopnea. There have been no reported palpitations, presyncope or syncope.   She is limited in her activities however because of her knee pain.  She can do some household chores and is sweeping.    ROS: ***  Studies Reviewed:    EKG:       ***  Risk Assessment/Calculations:   {Does this patient have ATRIAL FIBRILLATION?:[phone]} No BP recorded.  {Refresh Note OR Click here to enter BP  :1}***        Physical Exam:   VS:  There were no vitals taken for this visit.   Wt Readings from Last 3 Encounters:  11/02/23 210 lb (95.3 kg)  10/27/23 200 lb 9.6 oz (91 kg)  08/31/23 224 lb 13.9 oz (102 kg)     GEN: Well nourished, well developed in no acute distress NECK: No JVD; No carotid bruits CARDIAC: ***RR, *** murmurs, rubs, gallops RESPIRATORY:  Clear to auscultation without rales, wheezing or rhonchi  ABDOMEN: Soft, non-tender, non-distended EXTREMITIES:  No edema; No deformity   ASSESSMENT AND PLAN:   ABNORMAL EKG:  ***  The patient does have premature atrial contractions.  I will get the EKG from her primary provider but it sounds like they were saying the same thing or sinus arrhythmia.  She has no symptoms.  There are no high risk  findings or features.  No further testing is suggested.   Dyslipidemia: Her triglycerides were ***  257, total 190, LDL 81 HDL 58.  We discussed at length diet control for her hypertriglyceridemia.   DM: A1c is *** 7.3.  I will defer to Fulbright, [place]  E, PA-C  Follow up ***  Signed, Lynwood Schilling, MD

## 2024-03-04 ENCOUNTER — Ambulatory Visit: Attending: Cardiology | Admitting: Cardiology

## 2024-03-04 ENCOUNTER — Encounter: Payer: Self-pay | Admitting: Cardiology

## 2024-03-04 ENCOUNTER — Ambulatory Visit

## 2024-03-04 VITALS — BP 130/70 | HR 75 | Ht 65.0 in | Wt 209.0 lb

## 2024-03-04 DIAGNOSIS — R002 Palpitations: Secondary | ICD-10-CM

## 2024-03-04 DIAGNOSIS — R9431 Abnormal electrocardiogram [ECG] [EKG]: Secondary | ICD-10-CM | POA: Diagnosis not present

## 2024-03-04 DIAGNOSIS — E785 Hyperlipidemia, unspecified: Secondary | ICD-10-CM | POA: Diagnosis not present

## 2024-03-04 DIAGNOSIS — E118 Type 2 diabetes mellitus with unspecified complications: Secondary | ICD-10-CM | POA: Diagnosis not present

## 2024-03-04 DIAGNOSIS — R072 Precordial pain: Secondary | ICD-10-CM

## 2024-03-04 MED ORDER — METOPROLOL TARTRATE 100 MG PO TABS
100.0000 mg | ORAL_TABLET | Freq: Once | ORAL | 0 refills | Status: AC
Start: 2024-03-04 — End: 2024-03-04

## 2024-03-04 NOTE — Progress Notes (Unsigned)
 Enrolled patient for a 3 day Zio XT monitor to be mailed to patients home

## 2024-03-04 NOTE — Patient Instructions (Addendum)
 Medication Instructions:  Your physician recommends that you continue on your current medications as directed. Please refer to the Current Medication list given to you today.  *If you need a refill on your cardiac medications before your next appointment, please call your pharmacy*  Lab Work: BMET, CBC-TODAY If you have labs (blood work) drawn today and your tests are completely normal, you will receive your results only by: MyChart Message (if you have MyChart) OR A paper copy in the mail If you have any lab test that is abnormal or we need to change your treatment, we will call you to review the results.  Testing/Procedures:   Your cardiac CT will be scheduled at the below location:    [facility] [address]  [place], [place] [address] [phone]   If scheduled at the Heart and Vascular Tower at [facility] street, please enter the parking lot using the [address] entrance and use the FREE valet service at the patient drop-off area. Enter the building and check-in with registration on the main floor.  Please follow these instructions carefully (unless otherwise directed):  An IV will be required for this test and Nitroglycerin will be given.  Hold all erectile dysfunction medications at least 3 days (72 hrs) prior to test. (Ie viagra, cialis, sildenafil, tadalafil, etc)   On the Night Before the Test: Be sure to Drink plenty of water . Do not consume any caffeinated/decaffeinated beverages or chocolate 12 hours prior to your test. Do not take any antihistamines 12 hours prior to your test.  If the patient has contrast allergy: Patient will need a prescription for Prednisone  and very clear instructions (as follows): Prednisone  50 mg - take 13 hours prior to test Take another Prednisone  50 mg 7 hours prior to test Take another Prednisone  50 mg 1 hour prior to test Take Benadryl  50 mg 1 hour prior to test Patient must complete all four  doses of above prophylactic medications. Patient will need a ride after test due to Benadryl .  On the Day of the Test: Drink plenty of water  until 1 hour prior to the test. Do not eat any food 1 hour prior to test. You may take your regular medications prior to the test.  Take metoprolol (Lopressor) 100 mg two hours prior to test. If you take Furosemide/Hydrochlorothiazide/Spironolactone/Chlorthalidone, please HOLD on the morning of the test. Patients who wear a continuous glucose monitor MUST remove the device prior to scanning. FEMALES- please wear underwire-free bra if available, avoid dresses & tight clothing       After the Test: Drink plenty of water . After receiving IV contrast, you may experience a mild flushed feeling. This is normal. On occasion, you may experience a mild rash up to 24 hours after the test. This is not dangerous. If this occurs, you can take Benadryl  25 mg, Zyrtec, Claritin , or Allegra and increase your fluid intake. (Patients taking Tikosyn should avoid Benadryl , and may take Zyrtec, Claritin , or Allegra) If you experience trouble breathing, this can be serious. If it is severe call 911 IMMEDIATELY. If it is mild, please call our office.  We will call to schedule your test 2-4 weeks out understanding that some insurance companies will need an authorization prior to the service being performed.   For more information and frequently asked questions, please visit our website : [email or web address]  For non-scheduling related questions, please contact the cardiac imaging nurse navigator should you have any questions/concerns: Cardiac Imaging Nurse Navigators Direct  Office Dial: [phone]   For scheduling needs, including cancellations and rescheduling, please call [place], [phone].   ZIO XT- Long Term Monitor Instructions  Your physician has requested you wear a ZIO patch monitor for 3 days.  This is a single patch monitor. [employer]  supplies one patch monitor per enrollment. Additional stickers are not available. Please do not apply patch if you will be having a Nuclear Stress Test,  Echocardiogram, Cardiac CT, MRI, or Chest Xray during the period you would be wearing the  monitor. The patch cannot be worn during these tests. You cannot remove and re-apply the  ZIO XT patch monitor.  Your ZIO patch monitor will be mailed 3 day USPS to your address on file. It may take 3-5 days  to receive your monitor after you have been enrolled.  Once you have received your monitor, please review the enclosed instructions. Your monitor  has already been registered assigning a specific monitor serial # to you.  Billing and Patient Assistance Program Information  We have supplied [employer] with any of your insurance information on file for billing purposes. [employer] offers a sliding scale Patient Assistance Program for patients that do not have  insurance, or whose insurance does not completely cover the cost of the ZIO monitor.  You must apply for the Patient Assistance Program to qualify for this discounted rate.  To apply, please call [employer] at [phone], select option 4, select option 2, ask to apply for  Patient Assistance Program. [place] will ask your household income, and how many people  are in your household. They will quote your out-of-pocket cost based on that information.  [employer] will also be able to set up a 22-month, interest-free payment plan if needed.  Applying the monitor   Shave hair from upper left chest.  Hold abrader disc by orange tab. Rub abrader in 40 strokes over the upper left chest as  indicated in your monitor instructions.  Clean area with 4 enclosed alcohol pads. Let dry.  Apply patch as indicated in monitor instructions. Patch will be placed under collarbone on left  side of chest with arrow pointing upward.  Rub patch adhesive wings for 2 minutes. Remove white label marked 1. Remove the white   label marked 2. Rub patch adhesive wings for 2 additional minutes.  While looking in a mirror, press and release button in center of patch. A small green light will  flash 3-4 times. This will be your only indicator that the monitor has been turned on.  Do not shower for the first 24 hours. You may shower after the first 24 hours.  Press the button if you feel a symptom. You will hear a small click. Record Date, Time and  Symptom in the Patient Logbook.  When you are ready to remove the patch, follow instructions on the last 2 pages of Patient  Logbook. Stick patch monitor onto the last page of Patient Logbook.  Place Patient Logbook in the blue and white box. Use locking tab on box and tape box closed  securely. The blue and white box has prepaid postage on it. Please place it in the mailbox as  soon as possible. Your physician should have your test results approximately 7 days after the  monitor has been mailed back to [facility].  Call [facility] Customer Care at [phone] if you have questions regarding  your ZIO XT patch monitor. Call them immediately if you see an orange light blinking on your  monitor.  If your monitor falls off in less than 4 days, contact our Monitor department at [phone].  If your monitor becomes loose or falls off after 4 days call [employer] at [phone] for  suggestions on securing your monitor   Follow-Up: At [facility], you and your health needs are our priority.  As part of our continuing mission to provide you with exceptional heart care, our providers are all part of one team.  This team includes your primary Cardiologist (physician) and Advanced Practice Providers or APPs (Physician Assistants and Nurse Practitioners) who all work together to provide you with the care you need, when you need it.  Your next appointment:   As needed

## 2024-03-05 ENCOUNTER — Ambulatory Visit: Payer: Self-pay | Admitting: Cardiology

## 2024-03-05 LAB — CBC
Hematocrit: 37.3 % (ref 34.0–46.6)
Hemoglobin: 11.2 g/dL (ref 11.1–15.9)
MCH: 25.9 pg — ABNORMAL LOW (ref 26.6–33.0)
MCHC: 30 g/dL — ABNORMAL LOW (ref 31.5–35.7)
MCV: 86 fL (ref 79–97)
Platelets: 303 x10E3/uL (ref 150–450)
RBC: 4.32 x10E6/uL (ref 3.77–5.28)
RDW: 15.3 % (ref 11.7–15.4)
WBC: 9.9 x10E3/uL (ref 3.4–10.8)

## 2024-03-05 LAB — BASIC METABOLIC PANEL WITH GFR
BUN/Creatinine Ratio: 11 — ABNORMAL LOW (ref 12–28)
BUN: 11 mg/dL (ref 8–27)
CO2: 23 mmol/L (ref 20–29)
Calcium: 9.3 mg/dL (ref 8.7–10.3)
Chloride: 103 mmol/L (ref 96–106)
Creatinine, Ser: 0.96 mg/dL (ref 0.57–1.00)
Glucose: 58 mg/dL — ABNORMAL LOW (ref 70–99)
Potassium: 4.4 mmol/L (ref 3.5–5.2)
Sodium: 144 mmol/L (ref 134–144)
eGFR: 63 mL/min/1.73 (ref >59)

## 2024-03-09 ENCOUNTER — Encounter (HOSPITAL_COMMUNITY): Payer: Self-pay

## 2024-03-11 ENCOUNTER — Ambulatory Visit (HOSPITAL_COMMUNITY)
Admission: RE | Admit: 2024-03-11 | Discharge: 2024-03-11 | Disposition: A | Discharge status: Home / Self Care | Source: Ambulatory Visit | Attending: Cardiology | Admitting: Cardiology

## 2024-03-11 DIAGNOSIS — I2510 Atherosclerotic heart disease of native coronary artery without angina pectoris: Secondary | ICD-10-CM | POA: Diagnosis not present

## 2024-03-11 DIAGNOSIS — R072 Precordial pain: Secondary | ICD-10-CM | POA: Diagnosis present

## 2024-03-11 DIAGNOSIS — R931 Abnormal findings on diagnostic imaging of heart and coronary circulation: Secondary | ICD-10-CM | POA: Insufficient documentation

## 2024-03-11 MED ORDER — NITROGLYCERIN 0.4 MG SL SUBL
0.8000 mg | SUBLINGUAL_TABLET | Freq: Once | SUBLINGUAL | Status: AC
  Administered 2024-03-11: 0.8 mg via SUBLINGUAL

## 2024-03-11 MED ORDER — IOHEXOL 350 MG/ML SOLN
100.0000 mL | Freq: Once | INTRAVENOUS | Status: AC | PRN
  Administered 2024-03-11: 100 mL via INTRAVENOUS

## 2024-03-14 ENCOUNTER — Ambulatory Visit: Payer: Self-pay | Admitting: Cardiology

## 2024-03-14 ENCOUNTER — Ambulatory Visit (HOSPITAL_COMMUNITY)
Admission: RE | Admit: 2024-03-14 | Discharge: 2024-03-14 | Disposition: A | Source: Ambulatory Visit | Attending: Internal Medicine

## 2024-03-14 ENCOUNTER — Other Ambulatory Visit: Payer: Self-pay | Admitting: Internal Medicine

## 2024-03-14 DIAGNOSIS — R931 Abnormal findings on diagnostic imaging of heart and coronary circulation: Secondary | ICD-10-CM

## 2024-03-14 DIAGNOSIS — I2510 Atherosclerotic heart disease of native coronary artery without angina pectoris: Secondary | ICD-10-CM | POA: Diagnosis not present

## 2024-03-31 ENCOUNTER — Telehealth: Payer: Self-pay | Admitting: Cardiology

## 2024-03-31 NOTE — Telephone Encounter (Signed)
Patient is returning call to review CT results.

## 2024-03-31 NOTE — Telephone Encounter (Signed)
 Spoke with patient, shared CT results.  She has moderate plaque in the LAD.  She does not have any symptoms and this is not limiting blood flow.  I would like to see her back in Nov to review.  Call Ms. Jacquet with the results and send results to Fulbright, [place]  E, PA-C   Received the radiology over read.  No further imaging is needed.  The very small nodule did not need follow up.  Call Ms. Lavery with the results and send results to [facility], [place]  E, PA-C    Results faxed to PCP. Patient verbalized understanding of the above. Scheduled appt with Dr. Lavona for 04/26/24 per patient request. She will be putting on 3 day Zio soon, will need help [place] paramedic friend [place] beach to [place] on.

## 2024-03-31 NOTE — Telephone Encounter (Signed)
 Pt returned call - please advise

## 2024-04-07 ENCOUNTER — Ambulatory Visit: Admitting: Cardiology

## 2024-04-23 NOTE — Progress Notes (Deleted)
 Cardiology Office Note:   Date:  04/23/2024  ID:  Olivia Walker, DOB 1951/01/25, MRN [id number] PCP: Olivia Milus BRAVO, PA-C  [facility] Providers Cardiologist:  Lynwood Schilling, MD {  History of Present Illness:   Olivia Walker is a 73 y.o. female who is referred by Olivia, [place]  E, PA-C for evaluation of abnormal EKG. She has had palpitations.  She had chest pain and she had a coronary CT without obstructive CAD.  She had RCA moderate 50 - 75% in the prox and distal RCA.  ***   ***  I saw her a couple of years ago because of PACs.  No further therapy was warranted at that point in time.    She was to see me as needed.  She came back because of palpitations and chest pain.  The chest discomfort is left-sided.  It starts in her left breast and goes straight through.  She has had some jaw and arm discomfort.  It happens sporadically.  It is a 4 out of 10 in intensity.  It lasts for minutes at a time.  It is not happening every day but it is fairly frequent.  She is not having associated nausea vomiting or diaphoresis.  It comes on at rest.  She is very sedentary.  She had knee surgery and a hard time recovering from that earlier this year.     She is also describing palpitations.  These happen sporadically and are similar to what she had with her premature atrial contractions before but she thinks more intense.  She actually stopped the Lipitor and thinks it might of helped a little bit.  She has not had any presyncope or syncope.  She does not have any associated foods or beverages that contribute to this.    ROS: ***  Studies Reviewed:    EKG:       ***  Risk Assessment/Calculations:   {Does this patient have ATRIAL FIBRILLATION?:[phone]} No BP recorded.  {Refresh Note OR Click here to enter BP  :1}***        Physical Exam:   VS:  There were no vitals taken for this visit.   Wt Readings from Last 3 Encounters:  03/04/24 209 lb (94.8 kg)  11/02/23 210 lb (95.3  kg)  10/27/23 200 lb 9.6 oz (91 kg)     GEN: Well nourished, well developed in no acute distress NECK: No JVD; No carotid bruits CARDIAC: ***RR, *** murmurs, rubs, gallops RESPIRATORY:  Clear to auscultation without rales, wheezing or rhonchi  ABDOMEN: Soft, non-tender, non-distended EXTREMITIES:  No edema; No deformity   ASSESSMENT AND PLAN:   Palpitations:   ***  I suspect she still having her PACs but I will screen with a 3-day ZIO.  Likely I would manage this conservatively if there are no other arrhythmias.    Dyslipidemia:   ***  Her labs were better this year than they had been previously.  Her LDL was 66.  No change in therapy.     DM: A1c was ***   6.1 which is better than previous.    CAD:     This was non obstructive plaque.  ***  This is not anginal sounding greater than anginal but she needs further evaluation given risk factors.  To exclude obstructive coronary disease I think we would probably not get wonderful images with perfusion imaging given her size and body habitus.  I will order a coronary CTA.  She would not do  a walk on a treadmill.   Fatigue: ***  She is not using her CPAP.  I told her she really needs to restart this though she did not think it made a big difference.  Her TSH and hemoglobin were normal in the past recently.  There is no clear etiology.     Follow up ***  Signed, Lynwood Schilling, MD

## 2024-04-26 ENCOUNTER — Ambulatory Visit: Admitting: Cardiology

## 2024-04-26 DIAGNOSIS — E118 Type 2 diabetes mellitus with unspecified complications: Secondary | ICD-10-CM

## 2024-04-26 DIAGNOSIS — I2510 Atherosclerotic heart disease of native coronary artery without angina pectoris: Secondary | ICD-10-CM

## 2024-04-26 DIAGNOSIS — R002 Palpitations: Secondary | ICD-10-CM

## 2024-04-26 DIAGNOSIS — R5383 Other fatigue: Secondary | ICD-10-CM

## 2024-04-26 DIAGNOSIS — E785 Hyperlipidemia, unspecified: Secondary | ICD-10-CM

## 2024-05-22 DIAGNOSIS — R002 Palpitations: Secondary | ICD-10-CM | POA: Diagnosis not present

## 2024-06-09 NOTE — Progress Notes (Unsigned)
 " Cardiology Office Note:   Date:  06/09/2024  ID:  Olivia Walker, DOB 02-Sep-1950, MRN [id number] PCP: Boneta, [place]  E, PA-C  [facility] Providers Cardiologist:  Lynwood Schilling, MD {  History of Present Illness:   Olivia Walker is a 74 y.o. female who is referred by Boneta, [place]  E, PA-C for evaluation of abnormal EKG. I saw her a couple of years ago because of PACs.  No further therapy was warranted at that point in time.    She was to see me as needed.  She came back because of palpitations and chest pain.  The chest discomfort is left-sided.  It starts in her left breast and goes straight through.  She has had some jaw and arm discomfort.  It happens sporadically.  It is a 4 out of 10 in intensity.  It lasts for minutes at a time.  It is not happening every day but it is fairly frequent.  She is not having associated nausea vomiting or diaphoresis.  It comes on at rest.  She is very sedentary.  She had knee surgery and a hard time recovering from that earlier this year.     She is also describing palpitations.  These happen sporadically and are similar to what she had with her premature atrial contractions before but she thinks more intense.  She actually stopped the Lipitor and thinks it might of helped a little bit.  She has not had any presyncope or syncope.  She does not have any associated foods or beverages that contribute to this.  ROS: ***  Studies Reviewed:    EKG:       ***  Risk Assessment/Calculations:   {Does this patient have ATRIAL FIBRILLATION?:[id number]} No BP recorded.  {Refresh Note OR Click here to enter BP  :1}***        Physical Exam:   VS:  There were no vitals taken for this visit.   Wt Readings from Last 3 Encounters:  03/04/24 209 lb (94.8 kg)  11/02/23 210 lb (95.3 kg)  10/27/23 200 lb 9.6 oz (91 kg)     GEN: Well nourished, well developed in no acute distress NECK: No JVD; No carotid bruits CARDIAC: ***RR, *** murmurs, rubs,  gallops RESPIRATORY:  Clear to auscultation without rales, wheezing or rhonchi  ABDOMEN: Soft, non-tender, non-distended EXTREMITIES:  No edema; No deformity   ASSESSMENT AND PLAN:   Palpitations: ***  I suspect she still having her PACs but I will screen with a 3-day ZIO.  Likely I would manage this conservatively if there are no other arrhythmias.    Dyslipidemia: Her LDL.  ***   labs were ***   better this year than they had been previously.  Her LDL was 66.  No change in therapy.     DM: A1c is ***  6.1 which is better than previous.    Chest pain:   ***   This is not anginal sounding greater than anginal but she needs further evaluation given risk factors.  To exclude obstructive coronary disease I think we would probably not get wonderful images with perfusion imaging given her size and body habitus.  I will order a coronary CTA.  She would not do a walk on a treadmill.   Fatigue: ***  She is not using her CPAP.  I told her she really needs to restart this though she did not think it made a big difference.  Her TSH and hemoglobin were normal in  the past recently.  There is no clear etiology.     Follow up ***  Signed, Lynwood Schilling, MD   "

## 2024-06-10 ENCOUNTER — Ambulatory Visit: Admitting: Cardiology

## 2024-06-10 DIAGNOSIS — R072 Precordial pain: Secondary | ICD-10-CM

## 2024-06-10 DIAGNOSIS — R002 Palpitations: Secondary | ICD-10-CM

## 2024-06-10 DIAGNOSIS — E785 Hyperlipidemia, unspecified: Secondary | ICD-10-CM

## 2024-06-24 ENCOUNTER — Ambulatory Visit: Admitting: Cardiology

## 2024-07-21 ENCOUNTER — Ambulatory Visit: Admitting: Cardiology
